# Patient Record
Sex: Female | Born: 1945
Health system: Southern US, Community
[De-identification: ages and names within clinical notes are randomized; demographics above are authoritative.]

## PROBLEM LIST (undated history)

## (undated) DIAGNOSIS — I639 Cerebral infarction, unspecified: Secondary | ICD-10-CM

## (undated) DIAGNOSIS — E785 Hyperlipidemia, unspecified: Secondary | ICD-10-CM

## (undated) DIAGNOSIS — F0390 Unspecified dementia without behavioral disturbance: Secondary | ICD-10-CM

## (undated) DIAGNOSIS — I739 Peripheral vascular disease, unspecified: Secondary | ICD-10-CM

## (undated) DIAGNOSIS — I10 Essential (primary) hypertension: Secondary | ICD-10-CM

## (undated) HISTORY — PX: CHOLECYSTECTOMY: SHX55

## (undated) HISTORY — PX: STENT PLACEMENT ILIAC (ARMC HX): HXRAD1735

## (undated) HISTORY — PX: LEG SURGERY: SHX1003

---

## 1998-12-02 ENCOUNTER — Encounter: Payer: Self-pay | Admitting: Family Medicine

## 1998-12-02 ENCOUNTER — Ambulatory Visit (HOSPITAL_COMMUNITY): Admission: RE | Admit: 1998-12-02 | Discharge: 1998-12-02 | Payer: Self-pay | Admitting: Family Medicine

## 2002-04-27 ENCOUNTER — Ambulatory Visit (HOSPITAL_COMMUNITY): Admission: RE | Admit: 2002-04-27 | Discharge: 2002-04-27 | Payer: Self-pay | Admitting: Internal Medicine

## 2002-04-27 ENCOUNTER — Encounter: Payer: Self-pay | Admitting: Internal Medicine

## 2002-05-09 ENCOUNTER — Encounter: Payer: Self-pay | Admitting: Internal Medicine

## 2002-05-09 ENCOUNTER — Ambulatory Visit (HOSPITAL_COMMUNITY): Admission: RE | Admit: 2002-05-09 | Discharge: 2002-05-09 | Payer: Self-pay | Admitting: Internal Medicine

## 2002-08-30 ENCOUNTER — Encounter: Payer: Self-pay | Admitting: Orthopaedic Surgery

## 2002-08-30 ENCOUNTER — Inpatient Hospital Stay (HOSPITAL_COMMUNITY): Admission: EM | Admit: 2002-08-30 | Discharge: 2002-09-05 | Payer: Self-pay | Admitting: Emergency Medicine

## 2002-08-30 ENCOUNTER — Encounter: Payer: Self-pay | Admitting: Emergency Medicine

## 2005-12-23 ENCOUNTER — Ambulatory Visit: Payer: Self-pay | Admitting: Emergency Medicine

## 2006-01-22 ENCOUNTER — Ambulatory Visit: Admission: RE | Admit: 2006-01-22 | Discharge: 2006-01-22 | Payer: Self-pay | Admitting: Emergency Medicine

## 2006-02-04 ENCOUNTER — Ambulatory Visit: Payer: Self-pay | Admitting: Emergency Medicine

## 2007-08-25 ENCOUNTER — Ambulatory Visit: Payer: Self-pay | Admitting: Internal Medicine

## 2007-09-08 ENCOUNTER — Ambulatory Visit: Payer: Self-pay | Admitting: Internal Medicine

## 2007-09-08 ENCOUNTER — Encounter: Payer: Self-pay | Admitting: Internal Medicine

## 2007-12-12 ENCOUNTER — Ambulatory Visit (HOSPITAL_COMMUNITY): Admission: RE | Admit: 2007-12-12 | Discharge: 2007-12-12 | Payer: Self-pay | Admitting: Cardiovascular Disease

## 2010-07-30 ENCOUNTER — Encounter (INDEPENDENT_AMBULATORY_CARE_PROVIDER_SITE_OTHER): Payer: Self-pay | Admitting: *Deleted

## 2010-12-25 NOTE — Letter (Signed)
Summary: Colonoscopy Letter  [facility]  [address] [place], [place] [address]   Phone: [phone]  Fax: [phone]      July 30, 2010 MRN: [id number]   Morgan Sutton [place], [place]  [address]   Dear [name],   According to your medical record, it is time for you to schedule a Colonoscopy. The American Cancer Society recommends this procedure as a method to detect early colon cancer. Patients with a family history of colon cancer, or a personal history of colon polyps or inflammatory bowel disease are at increased risk.  This letter has beeen generated based on the recommendations made at the time of your procedure. If you feel that in your particular situation this may no longer apply, please contact our office.  Please call our office at [phone] to schedule this appointment or to update your records at your earliest convenience.  Thank you for cooperating with [place] to provide you with the very best care possible.   Sincerely,  Hedwig Morton. Juanda Chance, M.D.  [facility] [phone]

## 2011-04-07 NOTE — Cardiovascular Report (Signed)
NAMESharvi, Morgan Sutton                 ACCOUNT NO.:  [id number]   MEDICAL RECORD NO.:  [id number]          PATIENT TYPE:  AMB   LOCATION:  SDS                          FACILITY:  [facility]   PHYSICIAN:  Nanetta Batty, M.D.   DATE OF BIRTH:  [birth year]   DATE OF PROCEDURE:  DATE OF DISCHARGE:  12/12/2007                            CARDIAC CATHETERIZATION   PERIPHERAL ANGIOGRAM REPORT:  Morgan Sutton is a 65 year old mildly overweight Caucasian female patient  of Dr. Collins Scotland referred for claudication.  She also has been complaining  of tightening around her chest.   PAST HISTORY:  1. A remote TIA.  2. Hypertension.  3. Hyperlipidemia.  4. She does have a history of tobacco abuse.   She had a negative Myoview and normal 2-D echo.  Dopplers suggested a  moderately tight lesion in her left iliac.  She presents now for  abdominal aortography with bifemoral runoff.   PROCEDURE DESCRIPTION:  The patient was brought to the second floor  [facility] angiographic suite in the postabsorptive state.  She was  premedicated with p.o. Valium.  Her left groin was prepped and shaved in  the usual sterile fashion.  One percent Xylocaine was used for local  anesthesia.  A 5-French sheath was inserted into the left femoral artery  using the standard Seldinger technique.  A 5-French pigtail catheter and  4-French end-hole catheters were used for midstream and distal abdominal  aortography with bifemoral runoff.  A pullback gradient was performed  using a 4-French end-hole catheter across the left common iliac artery  after administration of 200 mcg of intra-arterial nitroglycerin to the  side-arm sheath.  Visipaque dye was used throughout the entirety of the  case.  Retrograde aortic pressure was monitored during the case.   ANGIOGRAPHIC RESULTS:  1. Abdominal aorta.      a.     Renal arteries - normal.      b.     Infrarenal abdominal aorta - normal.  2. Left lower extremity.      a.     Forty to fifty  percent diffuse left common iliac artery       stenosis with a 20-mm pullback gradient throughout its entirety       after administration of intra-arterial nitroglycerin distally.      b.     Three-vessel runoff.  3. Right lower extremity; normal.   IMPRESSION:  Morgan Sutton has an incidentally noted mild to moderate left  common iliac artery stenosis.  I do not think this is hemodynamically  significant enough to be causing her symptoms which are foot pain.  She  really denies hip and buttock or thigh claudication.   Sheath was removed and pressure applied to achieve hemostasis.  The  patient left the lab in stable condition.  She will remain recumbent for  5 hours and will be discharged home later today.  As an outpatient, she  will see me back in the office in 1-2 weeks for follow up.  She left the  lab in stable condition.      Christiane Ha  Allyson Sabal, M.D.  Electronically Signed     JB/MEDQ  D:  12/12/2007  T:  12/12/2007  Job:  [id number]   cc:   [facility]  [facility]  Tammy R. Collins Scotland, M.D.

## 2011-04-10 NOTE — Op Note (Signed)
NAMEFRANK, Morgan Sutton                           ACCOUNT NO.:  [id number]   MEDICAL RECORD NO.:  [id number]                   PATIENT TYPE:  INP   LOCATION:  0454                                 FACILITY:  [facility]   PHYSICIAN:  Claude Manges. Cleophas Dunker, M.D.            DATE OF BIRTH:  [birth year]   DATE OF PROCEDURE:  08/30/2002  DATE OF DISCHARGE:                                 OPERATIVE REPORT   PREOPERATIVE DIAGNOSIS:  Grade 1 open right tibiofibular fracture.   POSTOPERATIVE DIAGNOSIS:  Grade 1 open right tibiofibular fracture.   PROCEDURE:  1. I&D of open fractures.  2. Nonreamed intermedullary nailing of right tibia fracture.   SURGEON:  Claude Manges. Cleophas Dunker, M.D.   ASSISTANT:  Legrand Pitts. Duffy, P.A.-C.   ANESTHESIA:  General orotracheal.   COMPLICATIONS:  None.   DESCRIPTION OF PROCEDURE:  The patient comfortable on the operating table  and under general orotracheal anesthesia, the right lower extremity was  placed in a thigh tourniquet.  The leg was then prepped from the tourniquet  to the tips of the toes with Betadine scrub and then DuraPrep.  Sterile  draping was performed.  Then with the extremity still elevated, it was  Esmarch exsanguinated with a proximal tourniquet at 325 mmHg.   The fracture involved the junction of the middle and distal thirds of the  tibia.  There was comminution, and there were segmental fractures.  The open  component involved two punctate lesions at the level of the fracture  anteriorly.  There was very little bleeding.   The punctate areas were just opened up minimally with a 15 blade knife and  then irrigated with antibiotic solution.   About an inch and a half to two inch incision was made over the proximal  tibia, just medial to the midline of our sharp dissection and carried down  to subcutaneous tissue through fascia.  The patella ligament was identified  and retracted laterally.  Some periosteal dissection was performed.  At a  point  midway between the tibial tubercle and the joint line, a starter hole  was made with the awl.  The ball-tip guidepin was then placed through the  proximal and the tibial metaphysis and carefully directed across the  fracture site under direct visualization with the image intensifier.  Maintaining traction and reduction of the fracture, we had excellent  position on the first attempt.   We then inserted the plastic exchange tube followed by the smaller rigid  nonball-tipped guidepin.  We checked the fracture in AP and lateral  projection and felt we were in excellent position.   I elected to use a nonreamed nail.  We had templated either an 8 or a 9 and  felt the 9 would be very tight, and we did not want to further comminute the  fracture, so the ACE titanium tibial nail was utilized.  It was cannulated  and placed over the guidepin.  I then carefully directed with the proximal  guide attached to it across the fracture site, again maintaining traction  and alignment of the leg.  We checked with image intensification,  and we  carefully advanced the nail to the distal tibial metaphysis.  We checked an  AP and lateral projection and felt we had excellent position and excellent  stability with the varus valgus position.  It was still unstable with  rotation.   The proximal guide was used to insert one oblique nail in the proximal  tibia, and this was checked after appropriate reaming and measuring and  inserting the appropriate 4.5 screw.  It was in excellent position.   I elected to fix the IM nail distally with two screws.  One was placed  medial to lateral, the other anterior to posterior.  Using image  intensification, we free-handed the drill through the hole, appropriately  measured the depth of this, and inserted the self-tapping fixation screws.  We checked position and felt we were perfectly stable clinically with  rotation and in rotation without any enlargement in the fracture  site, and  the distal screws were within the bone and the IM nail.   The small screw insertion incisions were closed with skin clips.  I closed  one of the two irrigation incisions.  The proximal incision was closed with  a running 2-0 Vicryl and then skin clips.  Tourniquet was deflated at about  an hour.  There was immediate capillary refill to the toes, and we could  palpate the pulse.   Sterile bulky dressing was applied, followed by knee immobilizer.   The patient tolerated the procedure without complications.                                               Claude Manges. Cleophas Dunker, M.D.    PWW/MEDQ  D:  08/30/2002  T:  08/31/2002  Job:  [id number]

## 2011-04-10 NOTE — Discharge Summary (Signed)
Morgan Sutton, Morgan Sutton                           ACCOUNT NO.:  [id number]   MEDICAL RECORD NO.:  [id number]                   PATIENT TYPE:  INP   LOCATION:  0454                                 FACILITY:  [facility]   PHYSICIAN:  Claude Manges. Cleophas Dunker, M.D.            DATE OF BIRTH:  December 20, 1945   DATE OF ADMISSION:  08/30/2002  DATE OF DISCHARGE:  09/05/2002                                 DISCHARGE SUMMARY   ADMISSION DIAGNOSES:  1. Right tibia-fibula fracture.  2. Hypertension.  3. Anxiety disorder.  4. Depression.  5. History of peptic ulcer disease.  6. History of cerebrovascular accident.   DISCHARGE DIAGNOSES:  1. Comminuted displaced right tibia-fibula fracture status post     intramedullary nail of said fracture.  2. Mild cellulitis.  3. Urinary tract infection.  4. Constipation.  5. Hypertension.  6. Anxiety disorder.  7. Depression.  8. History of peptic ulcer disease.  9. History of cerebrovascular accident.   SURGICAL PROCEDURE:  On August 30, 2002 the patient underwent an I&D of her  right open tib-fib fracture with a intramedullary nail placement by Claude Manges.  Cleophas Dunker, M.D. assisted by Legrand Pitts. Duffy, P.A.-C.   COMPLICATIONS:  None.   CONSULTATIONS:  1. Pharmacy consult for gentamycin therapy August 31, 2002 in addition to a     case management consult, physical therapy, and occupational therapy     consult.  2. Rehab medicine consult August 31, 2002.   HISTORY OF PRESENT ILLNESS:  This 65 year old white female patient went to  jump off of her three-foot porch and she slipped and landed directly on that  right leg.  She complained of immediate pain and deformity of the leg and  was unable to stand.  She was brought to the emergency department where she  was found to have a comminuted displaced right tib-fib fracture and this was  open.  She was admitted for surgical fixation of the fracture.   HOSPITAL COURSE:  The patient tolerated her surgical procedure  well without  immediate postoperative complications.  She was subsequently transferred to  [facility] [place].  On postop day #1, she was afebrile with a T-max of 99.6.  Right leg  dressing was intact without drainage and leg was neurovascularly intact.  She did have some mild calf pain and this was monitored.   On postop day #2, dressing was changed and she was noted to have some  redness streaking from her ankle down into her foot.  It was felt the  staples were tight and that was taken out and she was placed in a posterior  splint.  She was switched to p.o. pain medicine and continued on therapy.  She was also noted on her admission urinalysis to have a urinary tract  infection and she was continued on IV antibiotic.  She continued to make  slow progress with therapy over the next several days.  She was switched to  a Verdene Rio on October 12th.  Her pain continued to decrease.  We felt she  would be a good candidate for rehab but she started to progress a little  faster and then was ready to go home on October 14th.  At that time, she was  having some problems with constipation and that was treated effectively with  a laxative and she was able to be discharged home later that day.   DISCHARGE INSTRUCTIONS:   DIET:  She can resume her regular prehospitalization diet.   MEDICATIONS:  She can continue her current prehospitalization medications  which included:  1. Maxzide 70/50 mg p.o. q.d.  2. Verapamil SR 240 mg p.o. q.d.  3. Effexor 37.5 mg p.o. b.i.d.  4. Trazodone 50 mg p.o. q.h.s.   Additional medications include:  1. Aspirin 325 mg, enteric-coated, one tablet p.o. q.d. for one month.  2. Senokot-S two tablets p.o. q.h.s.  May repeat in the a.m. if no BM.  3. Robaxin 500 mg 1-2 p.o. q.6 h. p.r.n. for spasm, dispense #40 with no     refill.  4. OxyContin 20 mg p.o. q.12 h., dispense #20 with no refill.  5. Percocet 5/325 mg 1-2 p.o. q.4 h. p.r.n. for pain, dispense #50 with no      refill.   ACTIVITY:  She is to be out of bed, partial weightbearing, 50% or less on  the right leg with the use of the walker.  She can keep the CAM Walker boot  in place and elevate the right foot whenever she is in bed.  She has  arranged for home health physical therapy per [facility].   WOUND CARE:  She needs to keep her right leg incisions clean and dry and  notify Dr. Cleophas Dunker of temp greater than 101.5, chills, pain unrelieved by  pain meds, or foul smelling drainage from the wound.   FOLLOW UP:  She needs a followup with Dr. Cleophas Dunker in our office the week  after discharge and she needs to call [phone] for that appointment.   LABORATORY AND ACCESSORY DATA:  On October 8th, white count 11.8, hemoglobin  15.2, hematocrit 44.1.  On October 9th, white count 11.9, hemoglobin 12.2,  hematocrit 35.9.  On October 11th, white count 7.9, hemoglobin 11.6,  hematocrit 33.6 and platelets 156.   On October 8th, potassium 3, glucose 116.  On October 9th, potassium 3.1,  glucose 124.  Urinalysis on October 8th showed cloudy yellow urine with  positive nitrite, small leukocyte esterase, rare epithelials, 3-6 white  cells, and few bacteria.   Portable x-ray used intraoperatively on October 8th showed severely  comminuted fracture of the distal tibia and fibula with possible air within  the ankle joint.  Chest x-ray taken on October 8th showed no active disease.  A C-arm was used intraoperatively and no films were kept for interpretation.     Legrand Pitts Duffy, P.A.                      Claude Manges. Cleophas Dunker, M.D.    KED/MEDQ  D:  09/23/2002  T:  09/24/2002  Job:  [id number]   cc:   Willis Modena, N.P.  [facility]

## 2011-08-13 LAB — URINALYSIS, ROUTINE W REFLEX MICROSCOPIC
Hgb urine dipstick: NEGATIVE
Nitrite: NEGATIVE
Protein, ur: NEGATIVE
Urobilinogen, UA: 0.2

## 2011-08-13 LAB — URINE MICROSCOPIC-ADD ON

## 2012-07-27 ENCOUNTER — Encounter: Payer: Self-pay | Admitting: Internal Medicine

## 2014-12-03 ENCOUNTER — Encounter: Payer: Self-pay | Admitting: Internal Medicine

## 2014-12-27 DIAGNOSIS — Z1239 Encounter for other screening for malignant neoplasm of breast: Secondary | ICD-10-CM | POA: Diagnosis not present

## 2014-12-27 DIAGNOSIS — Z79899 Other long term (current) drug therapy: Secondary | ICD-10-CM | POA: Diagnosis not present

## 2014-12-27 DIAGNOSIS — I2510 Atherosclerotic heart disease of native coronary artery without angina pectoris: Secondary | ICD-10-CM | POA: Diagnosis not present

## 2014-12-27 DIAGNOSIS — I10 Essential (primary) hypertension: Secondary | ICD-10-CM | POA: Diagnosis not present

## 2014-12-27 DIAGNOSIS — E780 Pure hypercholesterolemia: Secondary | ICD-10-CM | POA: Diagnosis not present

## 2014-12-27 DIAGNOSIS — J209 Acute bronchitis, unspecified: Secondary | ICD-10-CM | POA: Diagnosis not present

## 2015-01-10 DIAGNOSIS — I10 Essential (primary) hypertension: Secondary | ICD-10-CM | POA: Diagnosis not present

## 2015-01-10 DIAGNOSIS — R7309 Other abnormal glucose: Secondary | ICD-10-CM | POA: Diagnosis not present

## 2015-01-10 DIAGNOSIS — E780 Pure hypercholesterolemia: Secondary | ICD-10-CM | POA: Diagnosis not present

## 2016-01-23 DIAGNOSIS — G510 Bell's palsy: Secondary | ICD-10-CM | POA: Diagnosis not present

## 2016-01-23 DIAGNOSIS — R079 Chest pain, unspecified: Secondary | ICD-10-CM | POA: Diagnosis not present

## 2016-01-23 DIAGNOSIS — R29810 Facial weakness: Secondary | ICD-10-CM | POA: Diagnosis not present

## 2016-03-18 DIAGNOSIS — M25512 Pain in left shoulder: Secondary | ICD-10-CM | POA: Diagnosis not present

## 2016-03-18 DIAGNOSIS — S46912A Strain of unspecified muscle, fascia and tendon at shoulder and upper arm level, left arm, initial encounter: Secondary | ICD-10-CM | POA: Diagnosis not present

## 2016-05-05 DIAGNOSIS — I10 Essential (primary) hypertension: Secondary | ICD-10-CM | POA: Diagnosis not present

## 2016-05-05 DIAGNOSIS — F99 Mental disorder, not otherwise specified: Secondary | ICD-10-CM | POA: Diagnosis not present

## 2016-05-05 DIAGNOSIS — Z6829 Body mass index (BMI) 29.0-29.9, adult: Secondary | ICD-10-CM | POA: Diagnosis not present

## 2016-05-05 DIAGNOSIS — R29898 Other symptoms and signs involving the musculoskeletal system: Secondary | ICD-10-CM | POA: Diagnosis not present

## 2016-05-05 DIAGNOSIS — N3281 Overactive bladder: Secondary | ICD-10-CM | POA: Diagnosis not present

## 2016-05-05 DIAGNOSIS — I639 Cerebral infarction, unspecified: Secondary | ICD-10-CM | POA: Diagnosis not present

## 2016-05-15 DIAGNOSIS — R440 Auditory hallucinations: Secondary | ICD-10-CM | POA: Diagnosis not present

## 2016-05-15 DIAGNOSIS — Z8673 Personal history of transient ischemic attack (TIA), and cerebral infarction without residual deficits: Secondary | ICD-10-CM | POA: Diagnosis not present

## 2016-05-15 DIAGNOSIS — I119 Hypertensive heart disease without heart failure: Secondary | ICD-10-CM | POA: Diagnosis not present

## 2016-05-15 DIAGNOSIS — R441 Visual hallucinations: Secondary | ICD-10-CM | POA: Diagnosis not present

## 2016-05-15 DIAGNOSIS — M1991 Primary osteoarthritis, unspecified site: Secondary | ICD-10-CM | POA: Diagnosis not present

## 2016-05-15 DIAGNOSIS — R296 Repeated falls: Secondary | ICD-10-CM | POA: Diagnosis not present

## 2016-05-15 DIAGNOSIS — R29898 Other symptoms and signs involving the musculoskeletal system: Secondary | ICD-10-CM | POA: Diagnosis not present

## 2016-05-15 DIAGNOSIS — F0390 Unspecified dementia without behavioral disturbance: Secondary | ICD-10-CM | POA: Diagnosis not present

## 2016-05-15 DIAGNOSIS — Z6829 Body mass index (BMI) 29.0-29.9, adult: Secondary | ICD-10-CM | POA: Diagnosis not present

## 2016-05-15 DIAGNOSIS — N3281 Overactive bladder: Secondary | ICD-10-CM | POA: Diagnosis not present

## 2016-05-15 DIAGNOSIS — Z7982 Long term (current) use of aspirin: Secondary | ICD-10-CM | POA: Diagnosis not present

## 2016-05-15 DIAGNOSIS — Z9181 History of falling: Secondary | ICD-10-CM | POA: Diagnosis not present

## 2016-05-19 DIAGNOSIS — F0390 Unspecified dementia without behavioral disturbance: Secondary | ICD-10-CM | POA: Diagnosis not present

## 2016-05-19 DIAGNOSIS — M1991 Primary osteoarthritis, unspecified site: Secondary | ICD-10-CM | POA: Diagnosis not present

## 2016-05-19 DIAGNOSIS — R441 Visual hallucinations: Secondary | ICD-10-CM | POA: Diagnosis not present

## 2016-05-19 DIAGNOSIS — N3281 Overactive bladder: Secondary | ICD-10-CM | POA: Diagnosis not present

## 2016-05-19 DIAGNOSIS — R440 Auditory hallucinations: Secondary | ICD-10-CM | POA: Diagnosis not present

## 2016-05-19 DIAGNOSIS — I119 Hypertensive heart disease without heart failure: Secondary | ICD-10-CM | POA: Diagnosis not present

## 2016-05-20 DIAGNOSIS — I119 Hypertensive heart disease without heart failure: Secondary | ICD-10-CM | POA: Diagnosis not present

## 2016-05-20 DIAGNOSIS — R440 Auditory hallucinations: Secondary | ICD-10-CM | POA: Diagnosis not present

## 2016-05-20 DIAGNOSIS — M1991 Primary osteoarthritis, unspecified site: Secondary | ICD-10-CM | POA: Diagnosis not present

## 2016-05-20 DIAGNOSIS — N3281 Overactive bladder: Secondary | ICD-10-CM | POA: Diagnosis not present

## 2016-05-20 DIAGNOSIS — F0390 Unspecified dementia without behavioral disturbance: Secondary | ICD-10-CM | POA: Diagnosis not present

## 2016-05-20 DIAGNOSIS — R441 Visual hallucinations: Secondary | ICD-10-CM | POA: Diagnosis not present

## 2016-05-22 DIAGNOSIS — R441 Visual hallucinations: Secondary | ICD-10-CM | POA: Diagnosis not present

## 2016-05-22 DIAGNOSIS — I119 Hypertensive heart disease without heart failure: Secondary | ICD-10-CM | POA: Diagnosis not present

## 2016-05-22 DIAGNOSIS — N3281 Overactive bladder: Secondary | ICD-10-CM | POA: Diagnosis not present

## 2016-05-22 DIAGNOSIS — M1991 Primary osteoarthritis, unspecified site: Secondary | ICD-10-CM | POA: Diagnosis not present

## 2016-05-22 DIAGNOSIS — R440 Auditory hallucinations: Secondary | ICD-10-CM | POA: Diagnosis not present

## 2016-05-22 DIAGNOSIS — F0390 Unspecified dementia without behavioral disturbance: Secondary | ICD-10-CM | POA: Diagnosis not present

## 2016-05-25 DIAGNOSIS — M1991 Primary osteoarthritis, unspecified site: Secondary | ICD-10-CM | POA: Diagnosis not present

## 2016-05-25 DIAGNOSIS — F0390 Unspecified dementia without behavioral disturbance: Secondary | ICD-10-CM | POA: Diagnosis not present

## 2016-05-25 DIAGNOSIS — R0602 Shortness of breath: Secondary | ICD-10-CM | POA: Diagnosis not present

## 2016-05-25 DIAGNOSIS — N3281 Overactive bladder: Secondary | ICD-10-CM | POA: Diagnosis not present

## 2016-05-25 DIAGNOSIS — R440 Auditory hallucinations: Secondary | ICD-10-CM | POA: Diagnosis not present

## 2016-05-25 DIAGNOSIS — R441 Visual hallucinations: Secondary | ICD-10-CM | POA: Diagnosis not present

## 2016-05-25 DIAGNOSIS — I10 Essential (primary) hypertension: Secondary | ICD-10-CM | POA: Diagnosis not present

## 2016-05-25 DIAGNOSIS — Z79899 Other long term (current) drug therapy: Secondary | ICD-10-CM | POA: Diagnosis not present

## 2016-05-25 DIAGNOSIS — I119 Hypertensive heart disease without heart failure: Secondary | ICD-10-CM | POA: Diagnosis not present

## 2016-05-25 DIAGNOSIS — R42 Dizziness and giddiness: Secondary | ICD-10-CM | POA: Diagnosis not present

## 2016-05-26 DIAGNOSIS — R441 Visual hallucinations: Secondary | ICD-10-CM | POA: Diagnosis not present

## 2016-05-26 DIAGNOSIS — N3281 Overactive bladder: Secondary | ICD-10-CM | POA: Diagnosis not present

## 2016-05-26 DIAGNOSIS — I119 Hypertensive heart disease without heart failure: Secondary | ICD-10-CM | POA: Diagnosis not present

## 2016-05-26 DIAGNOSIS — M1991 Primary osteoarthritis, unspecified site: Secondary | ICD-10-CM | POA: Diagnosis not present

## 2016-05-26 DIAGNOSIS — R440 Auditory hallucinations: Secondary | ICD-10-CM | POA: Diagnosis not present

## 2016-05-26 DIAGNOSIS — F0390 Unspecified dementia without behavioral disturbance: Secondary | ICD-10-CM | POA: Diagnosis not present

## 2016-05-28 DIAGNOSIS — M1991 Primary osteoarthritis, unspecified site: Secondary | ICD-10-CM | POA: Diagnosis not present

## 2016-05-28 DIAGNOSIS — F0390 Unspecified dementia without behavioral disturbance: Secondary | ICD-10-CM | POA: Diagnosis not present

## 2016-05-28 DIAGNOSIS — N3281 Overactive bladder: Secondary | ICD-10-CM | POA: Diagnosis not present

## 2016-05-28 DIAGNOSIS — R441 Visual hallucinations: Secondary | ICD-10-CM | POA: Diagnosis not present

## 2016-05-28 DIAGNOSIS — I119 Hypertensive heart disease without heart failure: Secondary | ICD-10-CM | POA: Diagnosis not present

## 2016-05-28 DIAGNOSIS — R440 Auditory hallucinations: Secondary | ICD-10-CM | POA: Diagnosis not present

## 2016-05-29 DIAGNOSIS — R440 Auditory hallucinations: Secondary | ICD-10-CM | POA: Diagnosis not present

## 2016-05-29 DIAGNOSIS — F0390 Unspecified dementia without behavioral disturbance: Secondary | ICD-10-CM | POA: Diagnosis not present

## 2016-05-29 DIAGNOSIS — M1991 Primary osteoarthritis, unspecified site: Secondary | ICD-10-CM | POA: Diagnosis not present

## 2016-05-29 DIAGNOSIS — N3281 Overactive bladder: Secondary | ICD-10-CM | POA: Diagnosis not present

## 2016-05-29 DIAGNOSIS — I119 Hypertensive heart disease without heart failure: Secondary | ICD-10-CM | POA: Diagnosis not present

## 2016-05-29 DIAGNOSIS — R441 Visual hallucinations: Secondary | ICD-10-CM | POA: Diagnosis not present

## 2016-06-01 DIAGNOSIS — F0390 Unspecified dementia without behavioral disturbance: Secondary | ICD-10-CM | POA: Diagnosis not present

## 2016-06-01 DIAGNOSIS — I119 Hypertensive heart disease without heart failure: Secondary | ICD-10-CM | POA: Diagnosis not present

## 2016-06-01 DIAGNOSIS — N3281 Overactive bladder: Secondary | ICD-10-CM | POA: Diagnosis not present

## 2016-06-01 DIAGNOSIS — R440 Auditory hallucinations: Secondary | ICD-10-CM | POA: Diagnosis not present

## 2016-06-01 DIAGNOSIS — R441 Visual hallucinations: Secondary | ICD-10-CM | POA: Diagnosis not present

## 2016-06-01 DIAGNOSIS — M1991 Primary osteoarthritis, unspecified site: Secondary | ICD-10-CM | POA: Diagnosis not present

## 2016-06-02 DIAGNOSIS — I119 Hypertensive heart disease without heart failure: Secondary | ICD-10-CM | POA: Diagnosis not present

## 2016-06-02 DIAGNOSIS — N3281 Overactive bladder: Secondary | ICD-10-CM | POA: Diagnosis not present

## 2016-06-02 DIAGNOSIS — M1991 Primary osteoarthritis, unspecified site: Secondary | ICD-10-CM | POA: Diagnosis not present

## 2016-06-02 DIAGNOSIS — R441 Visual hallucinations: Secondary | ICD-10-CM | POA: Diagnosis not present

## 2016-06-02 DIAGNOSIS — R440 Auditory hallucinations: Secondary | ICD-10-CM | POA: Diagnosis not present

## 2016-06-02 DIAGNOSIS — F0390 Unspecified dementia without behavioral disturbance: Secondary | ICD-10-CM | POA: Diagnosis not present

## 2016-06-03 DIAGNOSIS — R441 Visual hallucinations: Secondary | ICD-10-CM | POA: Diagnosis not present

## 2016-06-03 DIAGNOSIS — I119 Hypertensive heart disease without heart failure: Secondary | ICD-10-CM | POA: Diagnosis not present

## 2016-06-03 DIAGNOSIS — F0390 Unspecified dementia without behavioral disturbance: Secondary | ICD-10-CM | POA: Diagnosis not present

## 2016-06-03 DIAGNOSIS — M1991 Primary osteoarthritis, unspecified site: Secondary | ICD-10-CM | POA: Diagnosis not present

## 2016-06-03 DIAGNOSIS — R440 Auditory hallucinations: Secondary | ICD-10-CM | POA: Diagnosis not present

## 2016-06-03 DIAGNOSIS — N3281 Overactive bladder: Secondary | ICD-10-CM | POA: Diagnosis not present

## 2016-06-04 DIAGNOSIS — Z6829 Body mass index (BMI) 29.0-29.9, adult: Secondary | ICD-10-CM | POA: Diagnosis not present

## 2016-06-04 DIAGNOSIS — I10 Essential (primary) hypertension: Secondary | ICD-10-CM | POA: Diagnosis not present

## 2016-06-04 DIAGNOSIS — Z1389 Encounter for screening for other disorder: Secondary | ICD-10-CM | POA: Diagnosis not present

## 2016-06-04 DIAGNOSIS — E041 Nontoxic single thyroid nodule: Secondary | ICD-10-CM | POA: Diagnosis not present

## 2016-06-04 DIAGNOSIS — R739 Hyperglycemia, unspecified: Secondary | ICD-10-CM | POA: Diagnosis not present

## 2016-06-04 DIAGNOSIS — Z79899 Other long term (current) drug therapy: Secondary | ICD-10-CM | POA: Diagnosis not present

## 2016-06-04 DIAGNOSIS — Z9181 History of falling: Secondary | ICD-10-CM | POA: Diagnosis not present

## 2016-06-04 DIAGNOSIS — F99 Mental disorder, not otherwise specified: Secondary | ICD-10-CM | POA: Diagnosis not present

## 2016-06-05 DIAGNOSIS — R441 Visual hallucinations: Secondary | ICD-10-CM | POA: Diagnosis not present

## 2016-06-05 DIAGNOSIS — F0390 Unspecified dementia without behavioral disturbance: Secondary | ICD-10-CM | POA: Diagnosis not present

## 2016-06-05 DIAGNOSIS — R440 Auditory hallucinations: Secondary | ICD-10-CM | POA: Diagnosis not present

## 2016-06-05 DIAGNOSIS — I119 Hypertensive heart disease without heart failure: Secondary | ICD-10-CM | POA: Diagnosis not present

## 2016-06-05 DIAGNOSIS — M1991 Primary osteoarthritis, unspecified site: Secondary | ICD-10-CM | POA: Diagnosis not present

## 2016-06-05 DIAGNOSIS — N3281 Overactive bladder: Secondary | ICD-10-CM | POA: Diagnosis not present

## 2016-06-09 DIAGNOSIS — R441 Visual hallucinations: Secondary | ICD-10-CM | POA: Diagnosis not present

## 2016-06-09 DIAGNOSIS — M1991 Primary osteoarthritis, unspecified site: Secondary | ICD-10-CM | POA: Diagnosis not present

## 2016-06-09 DIAGNOSIS — N3281 Overactive bladder: Secondary | ICD-10-CM | POA: Diagnosis not present

## 2016-06-09 DIAGNOSIS — I119 Hypertensive heart disease without heart failure: Secondary | ICD-10-CM | POA: Diagnosis not present

## 2016-06-09 DIAGNOSIS — F0390 Unspecified dementia without behavioral disturbance: Secondary | ICD-10-CM | POA: Diagnosis not present

## 2016-06-09 DIAGNOSIS — R440 Auditory hallucinations: Secondary | ICD-10-CM | POA: Diagnosis not present

## 2016-06-10 DIAGNOSIS — F0390 Unspecified dementia without behavioral disturbance: Secondary | ICD-10-CM | POA: Diagnosis not present

## 2016-06-10 DIAGNOSIS — R443 Hallucinations, unspecified: Secondary | ICD-10-CM | POA: Diagnosis not present

## 2016-06-10 DIAGNOSIS — I119 Hypertensive heart disease without heart failure: Secondary | ICD-10-CM | POA: Diagnosis not present

## 2016-06-10 DIAGNOSIS — R413 Other amnesia: Secondary | ICD-10-CM | POA: Diagnosis not present

## 2016-06-10 DIAGNOSIS — M1991 Primary osteoarthritis, unspecified site: Secondary | ICD-10-CM | POA: Diagnosis not present

## 2016-06-10 DIAGNOSIS — R441 Visual hallucinations: Secondary | ICD-10-CM | POA: Diagnosis not present

## 2016-06-10 DIAGNOSIS — R440 Auditory hallucinations: Secondary | ICD-10-CM | POA: Diagnosis not present

## 2016-06-10 DIAGNOSIS — N3281 Overactive bladder: Secondary | ICD-10-CM | POA: Diagnosis not present

## 2016-06-12 DIAGNOSIS — I119 Hypertensive heart disease without heart failure: Secondary | ICD-10-CM | POA: Diagnosis not present

## 2016-06-12 DIAGNOSIS — R441 Visual hallucinations: Secondary | ICD-10-CM | POA: Diagnosis not present

## 2016-06-12 DIAGNOSIS — M1991 Primary osteoarthritis, unspecified site: Secondary | ICD-10-CM | POA: Diagnosis not present

## 2016-06-12 DIAGNOSIS — F0390 Unspecified dementia without behavioral disturbance: Secondary | ICD-10-CM | POA: Diagnosis not present

## 2016-06-12 DIAGNOSIS — R440 Auditory hallucinations: Secondary | ICD-10-CM | POA: Diagnosis not present

## 2016-06-12 DIAGNOSIS — N3281 Overactive bladder: Secondary | ICD-10-CM | POA: Diagnosis not present

## 2016-06-16 DIAGNOSIS — R441 Visual hallucinations: Secondary | ICD-10-CM | POA: Diagnosis not present

## 2016-06-16 DIAGNOSIS — N3281 Overactive bladder: Secondary | ICD-10-CM | POA: Diagnosis not present

## 2016-06-16 DIAGNOSIS — I119 Hypertensive heart disease without heart failure: Secondary | ICD-10-CM | POA: Diagnosis not present

## 2016-06-16 DIAGNOSIS — M1991 Primary osteoarthritis, unspecified site: Secondary | ICD-10-CM | POA: Diagnosis not present

## 2016-06-16 DIAGNOSIS — F0390 Unspecified dementia without behavioral disturbance: Secondary | ICD-10-CM | POA: Diagnosis not present

## 2016-06-16 DIAGNOSIS — R440 Auditory hallucinations: Secondary | ICD-10-CM | POA: Diagnosis not present

## 2016-06-19 DIAGNOSIS — M1991 Primary osteoarthritis, unspecified site: Secondary | ICD-10-CM | POA: Diagnosis not present

## 2016-06-19 DIAGNOSIS — R440 Auditory hallucinations: Secondary | ICD-10-CM | POA: Diagnosis not present

## 2016-06-19 DIAGNOSIS — N3281 Overactive bladder: Secondary | ICD-10-CM | POA: Diagnosis not present

## 2016-06-19 DIAGNOSIS — R441 Visual hallucinations: Secondary | ICD-10-CM | POA: Diagnosis not present

## 2016-06-19 DIAGNOSIS — F0390 Unspecified dementia without behavioral disturbance: Secondary | ICD-10-CM | POA: Diagnosis not present

## 2016-06-19 DIAGNOSIS — I119 Hypertensive heart disease without heart failure: Secondary | ICD-10-CM | POA: Diagnosis not present

## 2016-06-22 DIAGNOSIS — I119 Hypertensive heart disease without heart failure: Secondary | ICD-10-CM | POA: Diagnosis not present

## 2016-06-22 DIAGNOSIS — M1991 Primary osteoarthritis, unspecified site: Secondary | ICD-10-CM | POA: Diagnosis not present

## 2016-06-22 DIAGNOSIS — R441 Visual hallucinations: Secondary | ICD-10-CM | POA: Diagnosis not present

## 2016-06-22 DIAGNOSIS — R440 Auditory hallucinations: Secondary | ICD-10-CM | POA: Diagnosis not present

## 2016-06-22 DIAGNOSIS — N3281 Overactive bladder: Secondary | ICD-10-CM | POA: Diagnosis not present

## 2016-06-22 DIAGNOSIS — F0390 Unspecified dementia without behavioral disturbance: Secondary | ICD-10-CM | POA: Diagnosis not present

## 2016-10-21 DIAGNOSIS — R6889 Other general symptoms and signs: Secondary | ICD-10-CM | POA: Diagnosis not present

## 2016-10-21 DIAGNOSIS — R001 Bradycardia, unspecified: Secondary | ICD-10-CM | POA: Diagnosis not present

## 2017-01-13 DIAGNOSIS — F0390 Unspecified dementia without behavioral disturbance: Secondary | ICD-10-CM | POA: Diagnosis not present

## 2017-01-13 DIAGNOSIS — I739 Peripheral vascular disease, unspecified: Secondary | ICD-10-CM | POA: Diagnosis not present

## 2017-01-13 DIAGNOSIS — R739 Hyperglycemia, unspecified: Secondary | ICD-10-CM | POA: Diagnosis not present

## 2017-01-13 DIAGNOSIS — I10 Essential (primary) hypertension: Secondary | ICD-10-CM | POA: Diagnosis not present

## 2017-01-13 DIAGNOSIS — R32 Unspecified urinary incontinence: Secondary | ICD-10-CM | POA: Diagnosis not present

## 2017-01-13 DIAGNOSIS — Z79899 Other long term (current) drug therapy: Secondary | ICD-10-CM | POA: Diagnosis not present

## 2017-01-13 DIAGNOSIS — E785 Hyperlipidemia, unspecified: Secondary | ICD-10-CM | POA: Diagnosis not present

## 2017-01-13 DIAGNOSIS — Z6830 Body mass index (BMI) 30.0-30.9, adult: Secondary | ICD-10-CM | POA: Diagnosis not present

## 2017-07-19 DIAGNOSIS — Z6828 Body mass index (BMI) 28.0-28.9, adult: Secondary | ICD-10-CM | POA: Diagnosis not present

## 2017-07-19 DIAGNOSIS — R739 Hyperglycemia, unspecified: Secondary | ICD-10-CM | POA: Diagnosis not present

## 2017-07-19 DIAGNOSIS — I739 Peripheral vascular disease, unspecified: Secondary | ICD-10-CM | POA: Diagnosis not present

## 2017-07-19 DIAGNOSIS — E785 Hyperlipidemia, unspecified: Secondary | ICD-10-CM | POA: Diagnosis not present

## 2017-07-19 DIAGNOSIS — R32 Unspecified urinary incontinence: Secondary | ICD-10-CM | POA: Diagnosis not present

## 2017-07-19 DIAGNOSIS — R29898 Other symptoms and signs involving the musculoskeletal system: Secondary | ICD-10-CM | POA: Diagnosis not present

## 2017-07-19 DIAGNOSIS — Z79899 Other long term (current) drug therapy: Secondary | ICD-10-CM | POA: Diagnosis not present

## 2017-07-19 DIAGNOSIS — E663 Overweight: Secondary | ICD-10-CM | POA: Diagnosis not present

## 2017-07-19 DIAGNOSIS — F0390 Unspecified dementia without behavioral disturbance: Secondary | ICD-10-CM | POA: Diagnosis not present

## 2017-07-19 DIAGNOSIS — I10 Essential (primary) hypertension: Secondary | ICD-10-CM | POA: Diagnosis not present

## 2017-12-29 DIAGNOSIS — R531 Weakness: Secondary | ICD-10-CM | POA: Diagnosis not present

## 2017-12-29 DIAGNOSIS — G459 Transient cerebral ischemic attack, unspecified: Secondary | ICD-10-CM | POA: Diagnosis not present

## 2017-12-29 DIAGNOSIS — R404 Transient alteration of awareness: Secondary | ICD-10-CM | POA: Diagnosis not present

## 2017-12-30 DIAGNOSIS — R29700 NIHSS score 0: Secondary | ICD-10-CM | POA: Diagnosis present

## 2017-12-30 DIAGNOSIS — R531 Weakness: Secondary | ICD-10-CM | POA: Diagnosis not present

## 2017-12-30 DIAGNOSIS — I639 Cerebral infarction, unspecified: Secondary | ICD-10-CM | POA: Diagnosis not present

## 2017-12-30 DIAGNOSIS — Z681 Body mass index (BMI) 19 or less, adult: Secondary | ICD-10-CM | POA: Diagnosis not present

## 2017-12-30 DIAGNOSIS — R55 Syncope and collapse: Secondary | ICD-10-CM | POA: Diagnosis not present

## 2017-12-30 DIAGNOSIS — G459 Transient cerebral ischemic attack, unspecified: Secondary | ICD-10-CM | POA: Diagnosis not present

## 2017-12-30 DIAGNOSIS — R279 Unspecified lack of coordination: Secondary | ICD-10-CM | POA: Diagnosis not present

## 2017-12-30 DIAGNOSIS — I6523 Occlusion and stenosis of bilateral carotid arteries: Secondary | ICD-10-CM | POA: Diagnosis not present

## 2017-12-30 DIAGNOSIS — Z7401 Bed confinement status: Secondary | ICD-10-CM | POA: Diagnosis not present

## 2017-12-30 DIAGNOSIS — I10 Essential (primary) hypertension: Secondary | ICD-10-CM | POA: Diagnosis not present

## 2017-12-30 DIAGNOSIS — F0390 Unspecified dementia without behavioral disturbance: Secondary | ICD-10-CM | POA: Diagnosis not present

## 2017-12-30 DIAGNOSIS — Z8679 Personal history of other diseases of the circulatory system: Secondary | ICD-10-CM | POA: Diagnosis not present

## 2017-12-30 DIAGNOSIS — F17210 Nicotine dependence, cigarettes, uncomplicated: Secondary | ICD-10-CM | POA: Diagnosis present

## 2017-12-30 DIAGNOSIS — J9600 Acute respiratory failure, unspecified whether with hypoxia or hypercapnia: Secondary | ICD-10-CM | POA: Diagnosis not present

## 2017-12-30 DIAGNOSIS — Z7982 Long term (current) use of aspirin: Secondary | ICD-10-CM | POA: Diagnosis not present

## 2017-12-30 DIAGNOSIS — I6389 Other cerebral infarction: Secondary | ICD-10-CM | POA: Diagnosis not present

## 2017-12-30 DIAGNOSIS — R4781 Slurred speech: Secondary | ICD-10-CM | POA: Diagnosis not present

## 2017-12-30 DIAGNOSIS — Z79899 Other long term (current) drug therapy: Secondary | ICD-10-CM | POA: Diagnosis not present

## 2017-12-30 DIAGNOSIS — R1312 Dysphagia, oropharyngeal phase: Secondary | ICD-10-CM | POA: Diagnosis present

## 2017-12-30 DIAGNOSIS — Z8673 Personal history of transient ischemic attack (TIA), and cerebral infarction without residual deficits: Secondary | ICD-10-CM | POA: Diagnosis not present

## 2017-12-30 DIAGNOSIS — R8271 Bacteriuria: Secondary | ICD-10-CM | POA: Diagnosis present

## 2017-12-30 DIAGNOSIS — I2510 Atherosclerotic heart disease of native coronary artery without angina pectoris: Secondary | ICD-10-CM | POA: Diagnosis not present

## 2017-12-30 DIAGNOSIS — I739 Peripheral vascular disease, unspecified: Secondary | ICD-10-CM | POA: Diagnosis present

## 2017-12-30 DIAGNOSIS — R627 Adult failure to thrive: Secondary | ICD-10-CM | POA: Diagnosis present

## 2017-12-30 DIAGNOSIS — M6281 Muscle weakness (generalized): Secondary | ICD-10-CM | POA: Diagnosis present

## 2017-12-30 DIAGNOSIS — R42 Dizziness and giddiness: Secondary | ICD-10-CM | POA: Diagnosis not present

## 2017-12-30 DIAGNOSIS — R29810 Facial weakness: Secondary | ICD-10-CM | POA: Diagnosis not present

## 2017-12-30 DIAGNOSIS — G8324 Monoplegia of upper limb affecting left nondominant side: Secondary | ICD-10-CM | POA: Diagnosis present

## 2017-12-30 DIAGNOSIS — E7800 Pure hypercholesterolemia, unspecified: Secondary | ICD-10-CM | POA: Diagnosis present

## 2017-12-30 DIAGNOSIS — G8321 Monoplegia of upper limb affecting right dominant side: Secondary | ICD-10-CM | POA: Diagnosis not present

## 2018-01-02 DIAGNOSIS — Z7401 Bed confinement status: Secondary | ICD-10-CM | POA: Diagnosis not present

## 2018-01-02 DIAGNOSIS — E559 Vitamin D deficiency, unspecified: Secondary | ICD-10-CM | POA: Diagnosis not present

## 2018-01-02 DIAGNOSIS — E860 Dehydration: Secondary | ICD-10-CM | POA: Diagnosis present

## 2018-01-02 DIAGNOSIS — R1312 Dysphagia, oropharyngeal phase: Secondary | ICD-10-CM | POA: Diagnosis present

## 2018-01-02 DIAGNOSIS — Z959 Presence of cardiac and vascular implant and graft, unspecified: Secondary | ICD-10-CM | POA: Diagnosis not present

## 2018-01-02 DIAGNOSIS — R402441 Other coma, without documented Glasgow coma scale score, or with partial score reported, in the field [EMT or ambulance]: Secondary | ICD-10-CM | POA: Diagnosis not present

## 2018-01-02 DIAGNOSIS — Z7982 Long term (current) use of aspirin: Secondary | ICD-10-CM | POA: Diagnosis not present

## 2018-01-02 DIAGNOSIS — L89153 Pressure ulcer of sacral region, stage 3: Secondary | ICD-10-CM | POA: Diagnosis present

## 2018-01-02 DIAGNOSIS — N179 Acute kidney failure, unspecified: Secondary | ICD-10-CM | POA: Diagnosis not present

## 2018-01-02 DIAGNOSIS — I2510 Atherosclerotic heart disease of native coronary artery without angina pectoris: Secondary | ICD-10-CM | POA: Diagnosis not present

## 2018-01-02 DIAGNOSIS — I739 Peripheral vascular disease, unspecified: Secondary | ICD-10-CM | POA: Diagnosis present

## 2018-01-02 DIAGNOSIS — E870 Hyperosmolality and hypernatremia: Secondary | ICD-10-CM | POA: Diagnosis not present

## 2018-01-02 DIAGNOSIS — G459 Transient cerebral ischemic attack, unspecified: Secondary | ICD-10-CM | POA: Diagnosis present

## 2018-01-02 DIAGNOSIS — E876 Hypokalemia: Secondary | ICD-10-CM | POA: Diagnosis present

## 2018-01-02 DIAGNOSIS — R29810 Facial weakness: Secondary | ICD-10-CM | POA: Diagnosis not present

## 2018-01-02 DIAGNOSIS — M6281 Muscle weakness (generalized): Secondary | ICD-10-CM | POA: Diagnosis present

## 2018-01-02 DIAGNOSIS — I639 Cerebral infarction, unspecified: Secondary | ICD-10-CM | POA: Diagnosis not present

## 2018-01-02 DIAGNOSIS — Z79899 Other long term (current) drug therapy: Secondary | ICD-10-CM | POA: Diagnosis not present

## 2018-01-02 DIAGNOSIS — I6359 Cerebral infarction due to unspecified occlusion or stenosis of other cerebral artery: Secondary | ICD-10-CM | POA: Diagnosis not present

## 2018-01-02 DIAGNOSIS — G9341 Metabolic encephalopathy: Secondary | ICD-10-CM | POA: Diagnosis not present

## 2018-01-02 DIAGNOSIS — Z6826 Body mass index (BMI) 26.0-26.9, adult: Secondary | ICD-10-CM | POA: Diagnosis not present

## 2018-01-02 DIAGNOSIS — R531 Weakness: Secondary | ICD-10-CM | POA: Diagnosis not present

## 2018-01-02 DIAGNOSIS — F0391 Unspecified dementia with behavioral disturbance: Secondary | ICD-10-CM | POA: Diagnosis not present

## 2018-01-02 DIAGNOSIS — G8321 Monoplegia of upper limb affecting right dominant side: Secondary | ICD-10-CM | POA: Diagnosis not present

## 2018-01-02 DIAGNOSIS — R4020 Unspecified coma: Secondary | ICD-10-CM | POA: Diagnosis not present

## 2018-01-02 DIAGNOSIS — S299XXA Unspecified injury of thorax, initial encounter: Secondary | ICD-10-CM | POA: Diagnosis not present

## 2018-01-02 DIAGNOSIS — Z8673 Personal history of transient ischemic attack (TIA), and cerebral infarction without residual deficits: Secondary | ICD-10-CM | POA: Diagnosis not present

## 2018-01-02 DIAGNOSIS — F0150 Vascular dementia without behavioral disturbance: Secondary | ICD-10-CM | POA: Diagnosis not present

## 2018-01-02 DIAGNOSIS — F0390 Unspecified dementia without behavioral disturbance: Secondary | ICD-10-CM | POA: Diagnosis not present

## 2018-01-02 DIAGNOSIS — R279 Unspecified lack of coordination: Secondary | ICD-10-CM | POA: Diagnosis not present

## 2018-01-02 DIAGNOSIS — R8271 Bacteriuria: Secondary | ICD-10-CM | POA: Diagnosis present

## 2018-01-02 DIAGNOSIS — E441 Mild protein-calorie malnutrition: Secondary | ICD-10-CM | POA: Diagnosis present

## 2018-01-02 DIAGNOSIS — Z9861 Coronary angioplasty status: Secondary | ICD-10-CM | POA: Diagnosis not present

## 2018-01-02 DIAGNOSIS — R4182 Altered mental status, unspecified: Secondary | ICD-10-CM | POA: Diagnosis not present

## 2018-01-02 DIAGNOSIS — Z8679 Personal history of other diseases of the circulatory system: Secondary | ICD-10-CM | POA: Diagnosis not present

## 2018-01-02 DIAGNOSIS — R627 Adult failure to thrive: Secondary | ICD-10-CM | POA: Diagnosis present

## 2018-01-02 DIAGNOSIS — R0902 Hypoxemia: Secondary | ICD-10-CM | POA: Diagnosis not present

## 2018-01-02 DIAGNOSIS — R4781 Slurred speech: Secondary | ICD-10-CM | POA: Diagnosis not present

## 2018-01-02 DIAGNOSIS — I10 Essential (primary) hypertension: Secondary | ICD-10-CM | POA: Diagnosis not present

## 2018-01-02 DIAGNOSIS — F0151 Vascular dementia with behavioral disturbance: Secondary | ICD-10-CM | POA: Diagnosis not present

## 2018-01-02 DIAGNOSIS — R400 Somnolence: Secondary | ICD-10-CM | POA: Diagnosis not present

## 2018-01-03 DIAGNOSIS — I739 Peripheral vascular disease, unspecified: Secondary | ICD-10-CM | POA: Diagnosis not present

## 2018-01-03 DIAGNOSIS — I6359 Cerebral infarction due to unspecified occlusion or stenosis of other cerebral artery: Secondary | ICD-10-CM | POA: Diagnosis not present

## 2018-01-03 DIAGNOSIS — I10 Essential (primary) hypertension: Secondary | ICD-10-CM | POA: Diagnosis not present

## 2018-01-03 DIAGNOSIS — E559 Vitamin D deficiency, unspecified: Secondary | ICD-10-CM | POA: Diagnosis not present

## 2018-01-06 DIAGNOSIS — I739 Peripheral vascular disease, unspecified: Secondary | ICD-10-CM | POA: Diagnosis not present

## 2018-01-06 DIAGNOSIS — I6359 Cerebral infarction due to unspecified occlusion or stenosis of other cerebral artery: Secondary | ICD-10-CM | POA: Diagnosis not present

## 2018-01-06 DIAGNOSIS — I10 Essential (primary) hypertension: Secondary | ICD-10-CM | POA: Diagnosis not present

## 2018-01-06 DIAGNOSIS — F0150 Vascular dementia without behavioral disturbance: Secondary | ICD-10-CM | POA: Diagnosis not present

## 2018-01-10 DIAGNOSIS — I639 Cerebral infarction, unspecified: Secondary | ICD-10-CM | POA: Diagnosis not present

## 2018-01-10 DIAGNOSIS — I10 Essential (primary) hypertension: Secondary | ICD-10-CM | POA: Diagnosis not present

## 2018-01-10 DIAGNOSIS — I739 Peripheral vascular disease, unspecified: Secondary | ICD-10-CM | POA: Diagnosis not present

## 2018-01-10 DIAGNOSIS — F0150 Vascular dementia without behavioral disturbance: Secondary | ICD-10-CM | POA: Diagnosis not present

## 2018-01-17 DIAGNOSIS — F0150 Vascular dementia without behavioral disturbance: Secondary | ICD-10-CM | POA: Diagnosis not present

## 2018-01-17 DIAGNOSIS — I739 Peripheral vascular disease, unspecified: Secondary | ICD-10-CM | POA: Diagnosis not present

## 2018-01-17 DIAGNOSIS — I6359 Cerebral infarction due to unspecified occlusion or stenosis of other cerebral artery: Secondary | ICD-10-CM | POA: Diagnosis not present

## 2018-01-17 DIAGNOSIS — I10 Essential (primary) hypertension: Secondary | ICD-10-CM | POA: Diagnosis not present

## 2018-01-20 DIAGNOSIS — R0902 Hypoxemia: Secondary | ICD-10-CM | POA: Diagnosis not present

## 2018-01-20 DIAGNOSIS — R4182 Altered mental status, unspecified: Secondary | ICD-10-CM | POA: Diagnosis not present

## 2018-01-20 DIAGNOSIS — E860 Dehydration: Secondary | ICD-10-CM | POA: Diagnosis not present

## 2018-01-20 DIAGNOSIS — I6359 Cerebral infarction due to unspecified occlusion or stenosis of other cerebral artery: Secondary | ICD-10-CM | POA: Diagnosis not present

## 2018-01-21 ENCOUNTER — Emergency Department (HOSPITAL_COMMUNITY): Payer: Medicare Other

## 2018-01-21 ENCOUNTER — Encounter (HOSPITAL_COMMUNITY): Payer: Self-pay

## 2018-01-21 ENCOUNTER — Inpatient Hospital Stay (HOSPITAL_COMMUNITY)
Admission: EM | Admit: 2018-01-21 | Discharge: 2018-01-25 | DRG: 682 | Disposition: A | Discharge status: Skilled Nursing Facility | Payer: Medicare Other | Attending: Internal Medicine | Admitting: Internal Medicine

## 2018-01-21 ENCOUNTER — Other Ambulatory Visit: Payer: Self-pay

## 2018-01-21 DIAGNOSIS — R627 Adult failure to thrive: Secondary | ICD-10-CM | POA: Diagnosis present

## 2018-01-21 DIAGNOSIS — E870 Hyperosmolality and hypernatremia: Secondary | ICD-10-CM | POA: Diagnosis not present

## 2018-01-21 DIAGNOSIS — Z8673 Personal history of transient ischemic attack (TIA), and cerebral infarction without residual deficits: Secondary | ICD-10-CM

## 2018-01-21 DIAGNOSIS — Z6826 Body mass index (BMI) 26.0-26.9, adult: Secondary | ICD-10-CM

## 2018-01-21 DIAGNOSIS — G9341 Metabolic encephalopathy: Secondary | ICD-10-CM

## 2018-01-21 DIAGNOSIS — Z959 Presence of cardiac and vascular implant and graft, unspecified: Secondary | ICD-10-CM | POA: Diagnosis not present

## 2018-01-21 DIAGNOSIS — F0390 Unspecified dementia without behavioral disturbance: Secondary | ICD-10-CM | POA: Diagnosis present

## 2018-01-21 DIAGNOSIS — F0391 Unspecified dementia with behavioral disturbance: Secondary | ICD-10-CM | POA: Diagnosis not present

## 2018-01-21 DIAGNOSIS — R400 Somnolence: Secondary | ICD-10-CM

## 2018-01-21 DIAGNOSIS — Z9861 Coronary angioplasty status: Secondary | ICD-10-CM

## 2018-01-21 DIAGNOSIS — E441 Mild protein-calorie malnutrition: Secondary | ICD-10-CM | POA: Diagnosis present

## 2018-01-21 DIAGNOSIS — Z7902 Long term (current) use of antithrombotics/antiplatelets: Secondary | ICD-10-CM | POA: Diagnosis not present

## 2018-01-21 DIAGNOSIS — L89153 Pressure ulcer of sacral region, stage 3: Secondary | ICD-10-CM | POA: Diagnosis present

## 2018-01-21 DIAGNOSIS — E860 Dehydration: Secondary | ICD-10-CM | POA: Diagnosis present

## 2018-01-21 DIAGNOSIS — E876 Hypokalemia: Secondary | ICD-10-CM | POA: Diagnosis present

## 2018-01-21 DIAGNOSIS — R2689 Other abnormalities of gait and mobility: Secondary | ICD-10-CM | POA: Diagnosis not present

## 2018-01-21 DIAGNOSIS — R4020 Unspecified coma: Secondary | ICD-10-CM | POA: Diagnosis not present

## 2018-01-21 DIAGNOSIS — M6281 Muscle weakness (generalized): Secondary | ICD-10-CM | POA: Diagnosis not present

## 2018-01-21 DIAGNOSIS — F0150 Vascular dementia without behavioral disturbance: Secondary | ICD-10-CM | POA: Diagnosis not present

## 2018-01-21 DIAGNOSIS — L8990 Pressure ulcer of unspecified site, unspecified stage: Secondary | ICD-10-CM

## 2018-01-21 DIAGNOSIS — E871 Hypo-osmolality and hyponatremia: Secondary | ICD-10-CM | POA: Diagnosis not present

## 2018-01-21 DIAGNOSIS — R531 Weakness: Secondary | ICD-10-CM | POA: Diagnosis not present

## 2018-01-21 DIAGNOSIS — N179 Acute kidney failure, unspecified: Principal | ICD-10-CM | POA: Diagnosis present

## 2018-01-21 DIAGNOSIS — Z79899 Other long term (current) drug therapy: Secondary | ICD-10-CM

## 2018-01-21 DIAGNOSIS — Z7982 Long term (current) use of aspirin: Secondary | ICD-10-CM | POA: Diagnosis not present

## 2018-01-21 DIAGNOSIS — S299XXA Unspecified injury of thorax, initial encounter: Secondary | ICD-10-CM | POA: Diagnosis not present

## 2018-01-21 DIAGNOSIS — N289 Disorder of kidney and ureter, unspecified: Secondary | ICD-10-CM | POA: Diagnosis not present

## 2018-01-21 DIAGNOSIS — I10 Essential (primary) hypertension: Secondary | ICD-10-CM | POA: Diagnosis present

## 2018-01-21 DIAGNOSIS — I739 Peripheral vascular disease, unspecified: Secondary | ICD-10-CM | POA: Diagnosis present

## 2018-01-21 DIAGNOSIS — L89323 Pressure ulcer of left buttock, stage 3: Secondary | ICD-10-CM | POA: Diagnosis not present

## 2018-01-21 DIAGNOSIS — F0151 Vascular dementia with behavioral disturbance: Secondary | ICD-10-CM | POA: Diagnosis not present

## 2018-01-21 DIAGNOSIS — R1312 Dysphagia, oropharyngeal phase: Secondary | ICD-10-CM | POA: Diagnosis not present

## 2018-01-21 DIAGNOSIS — I2510 Atherosclerotic heart disease of native coronary artery without angina pectoris: Secondary | ICD-10-CM | POA: Diagnosis present

## 2018-01-21 DIAGNOSIS — R4182 Altered mental status, unspecified: Secondary | ICD-10-CM | POA: Diagnosis not present

## 2018-01-21 DIAGNOSIS — R402441 Other coma, without documented Glasgow coma scale score, or with partial score reported, in the field [EMT or ambulance]: Secondary | ICD-10-CM | POA: Diagnosis not present

## 2018-01-21 HISTORY — DX: Unspecified dementia, unspecified severity, without behavioral disturbance, psychotic disturbance, mood disturbance, and anxiety: F03.90

## 2018-01-21 HISTORY — DX: Essential (primary) hypertension: I10

## 2018-01-21 HISTORY — DX: Peripheral vascular disease, unspecified: I73.9

## 2018-01-21 HISTORY — DX: Cerebral infarction, unspecified: I63.9

## 2018-01-21 LAB — CBC WITH DIFFERENTIAL/PLATELET
BASOS ABS: 0 10*3/uL (ref 0.0–0.1)
Basophils Relative: 0 %
Eosinophils Absolute: 0.3 10*3/uL (ref 0.0–0.7)
Eosinophils Relative: 3 %
HEMATOCRIT: 52.8 % — AB (ref 36.0–46.0)
Hemoglobin: 16.1 g/dL — ABNORMAL HIGH (ref 12.0–15.0)
LYMPHS PCT: 26 %
Lymphs Abs: 2.6 10*3/uL (ref 0.7–4.0)
MCH: 29 pg (ref 26.0–34.0)
MCHC: 30.5 g/dL (ref 30.0–36.0)
MCV: 95 fL (ref 78.0–100.0)
MONO ABS: 0.6 10*3/uL (ref 0.1–1.0)
Monocytes Relative: 6 %
NEUTROS ABS: 6.7 10*3/uL (ref 1.7–7.7)
Neutrophils Relative %: 65 %
Platelets: 189 10*3/uL (ref 150–400)
RBC: 5.56 MIL/uL — ABNORMAL HIGH (ref 3.87–5.11)
RDW: 16.3 % — AB (ref 11.5–15.5)
WBC: 10.2 10*3/uL (ref 4.0–10.5)

## 2018-01-21 LAB — COMPREHENSIVE METABOLIC PANEL
ALBUMIN: 3.5 g/dL (ref 3.5–5.0)
ALT: 48 U/L (ref 14–54)
ANION GAP: 11 (ref 5–15)
AST: 38 U/L (ref 15–41)
Alkaline Phosphatase: 82 U/L (ref 38–126)
BILIRUBIN TOTAL: 1.1 mg/dL (ref 0.3–1.2)
BUN: 42 mg/dL — ABNORMAL HIGH (ref 6–20)
CO2: 23 mmol/L (ref 22–32)
Calcium: 8.9 mg/dL (ref 8.9–10.3)
Chloride: 124 mmol/L — ABNORMAL HIGH (ref 101–111)
Creatinine, Ser: 1.53 mg/dL — ABNORMAL HIGH (ref 0.44–1.00)
GFR calc Af Amer: 38 mL/min — ABNORMAL LOW (ref >60)
GFR calc non Af Amer: 33 mL/min — ABNORMAL LOW (ref >60)
GLUCOSE: 136 mg/dL — AB (ref 65–99)
POTASSIUM: 3.5 mmol/L (ref 3.5–5.1)
SODIUM: 158 mmol/L — AB (ref 135–145)
TOTAL PROTEIN: 6.7 g/dL (ref 6.5–8.1)

## 2018-01-21 LAB — URINALYSIS, COMPLETE (UACMP) WITH MICROSCOPIC
BILIRUBIN URINE: NEGATIVE
Glucose, UA: NEGATIVE mg/dL
HGB URINE DIPSTICK: NEGATIVE
Ketones, ur: NEGATIVE mg/dL
LEUKOCYTES UA: NEGATIVE
Nitrite: POSITIVE — AB
Protein, ur: NEGATIVE mg/dL
Specific Gravity, Urine: 1.02 (ref 1.005–1.030)
pH: 5 (ref 5.0–8.0)

## 2018-01-21 LAB — I-STAT CG4 LACTIC ACID, ED: Lactic Acid, Venous: 1.44 mmol/L (ref 0.5–1.9)

## 2018-01-21 LAB — I-STAT TROPONIN, ED: Troponin i, poc: 0 ng/mL (ref 0.00–0.08)

## 2018-01-21 LAB — AMMONIA: AMMONIA: 29 umol/L (ref 9–35)

## 2018-01-21 MED ORDER — SODIUM CHLORIDE 0.9 % IV BOLUS (SEPSIS)
1000.0000 mL | Freq: Once | INTRAVENOUS | Status: AC
  Administered 2018-01-21: 1000 mL via INTRAVENOUS

## 2018-01-21 MED ORDER — CLOPIDOGREL BISULFATE 75 MG PO TABS
75.0000 mg | ORAL_TABLET | Freq: Every day | ORAL | Status: DC
  Administered 2018-01-22 – 2018-01-25 (×4): 75 mg via ORAL
  Filled 2018-01-21 (×3): qty 1

## 2018-01-21 MED ORDER — SODIUM CHLORIDE 0.9 % IV SOLN: INTRAVENOUS | Status: DC

## 2018-01-21 MED ORDER — ACETAMINOPHEN 650 MG RE SUPP: 650.0000 mg | Freq: Four times a day (QID) | RECTAL | Status: DC | PRN

## 2018-01-21 MED ORDER — ENOXAPARIN SODIUM 40 MG/0.4ML ~~LOC~~ SOLN
40.0000 mg | SUBCUTANEOUS | Status: DC
  Administered 2018-01-21 – 2018-01-24 (×4): 40 mg via SUBCUTANEOUS
  Filled 2018-01-21 (×4): qty 0.4

## 2018-01-21 MED ORDER — ONDANSETRON HCL 4 MG/2ML IJ SOLN: 4.0000 mg | Freq: Four times a day (QID) | INTRAMUSCULAR | Status: DC | PRN

## 2018-01-21 MED ORDER — ONDANSETRON HCL 4 MG PO TABS: 4.0000 mg | ORAL_TABLET | Freq: Four times a day (QID) | ORAL | Status: DC | PRN

## 2018-01-21 MED ORDER — SODIUM CHLORIDE 0.9% FLUSH
3.0000 mL | Freq: Two times a day (BID) | INTRAVENOUS | Status: DC
  Administered 2018-01-21 – 2018-01-25 (×5): 3 mL via INTRAVENOUS

## 2018-01-21 MED ORDER — ASPIRIN EC 81 MG PO TBEC
81.0000 mg | DELAYED_RELEASE_TABLET | Freq: Every day | ORAL | Status: DC
  Administered 2018-01-22 – 2018-01-25 (×4): 81 mg via ORAL
  Filled 2018-01-21 (×4): qty 1

## 2018-01-21 MED ORDER — SODIUM CHLORIDE 0.45 % IV SOLN: INTRAVENOUS | Status: DC

## 2018-01-21 MED ORDER — ACETAMINOPHEN 325 MG PO TABS: 650.0000 mg | ORAL_TABLET | Freq: Four times a day (QID) | ORAL | Status: DC | PRN

## 2018-01-21 NOTE — H&P (Signed)
History and Physical    Morgan Sutton:[id number] DOB: [birth year] DOA: 01/21/2018  **Will admit patient based on the expectation that the patient will need hospitalization/ hospital care that crosses at least 2 midnights  PCP: Eunice Blase, PA-C   Attending physician: Morgan Sutton  Patient coming from/Resides with: SNF/[facility]  Chief Complaint: Altered mental status  HPI: Morgan Sutton is a 72 y.o. female with medical history significant for peripheral vascular disease status post iliac stents, recent acute CVA February 2019, dementia with psychotic features, and hypertension.  Patient was sent from nursing facility after family and staff noticed increased lethargy and failure to thrive over 4 days.  Patient refused medications last night.  Patient slow to respond but oriented to self and attempting to answer questions.  Labs revealed acute hyponatremia with a sodium of 158, acute kidney injury with creatinine increasing from a baseline of 0.89 to 1.53, hemoglobin is elevated at 16.1 from a baseline of 10.5 revealing hemoconcentration in the setting of acute dehydration.  ED Course:  Vital Signs: BP 98/67   Pulse 66   Temp 97.9 F (36.6 C) (Oral)   Resp 14   Ht 5\' 5"  (1.651 m)   Wt 79.4 kg (175 lb)   SpO2 96%   BMI 29.12 kg/m  CXR: Neg CT head without contrast: No acute changes Lab data: Sodium 158, potassium 3.5, chloride 124, CO2 23, glucose 136, BUN 42, creatinine 1.53, LFTs not elevated, poc troponin 0.00, lactic acid 1.44, white count 10,200, neutrophils 65% with absolute neutrophils 6.7%, hemoglobin 16.1, platelets 189,000, urinalysis abnormal with cloudy appearance, positive nitrite, specific gravity 1.020, many bacteria, 0-5 WBCs, urine culture pending Medications and treatments: Saline bolus times 1 L  Review of Systems:  **Unable to obtain from patient given altered mentation history obtained from electronic medical record as well as to documentation provided from  nursing facility   Past Medical History:  Diagnosis Date  . Cerebral infarction Feb 2019   . Dementia   . Hypertension   . Peripheral vascular disease (HCC)   . Stroke [facility])     History reviewed. No pertinent surgical history.  Social History   Socioeconomic History  . Marital status: Widowed    Spouse name: Not on file  . Number of children: Not on file  . Years of education: Not on file  . Highest education level: Not on file  Social Needs  . Financial resource strain: Not on file  . Food insecurity - worry: Not on file  . Food insecurity - inability: Not on file  . Transportation needs - medical: Not on file  . Transportation needs - non-medical: Not on file  Occupational History  . Not on file  Tobacco Use  . Smoking status: Unknown If Ever Smoked  Substance and Sexual Activity  . Alcohol use: No    Frequency: Never  . Drug use: No  . Sexual activity: Not on file  Other Topics Concern  . Not on file  Social History Narrative  . Not on file    Mobility: Rolling walker/wheelchair Work history: Not obtained   No Known Allergies   Family history reviewed from electronic medical record -unable to obtain from patient secondary to altered mentation  Prior to Admission medications   Medication Sig Start Date End Date Taking? Authorizing Provider  amLODipine (NORVASC) 10 MG tablet Take 10 mg by mouth daily.   Yes [provider]  aspirin EC 81 MG tablet Take 81 mg by  mouth daily.   Yes [provider]  Cholecalciferol (VITAMIN D3) 10000 units TABS Take 30,000 Units by mouth daily.   Yes [provider]  clopidogrel (PLAVIX) 75 MG tablet Take 75 mg by mouth daily.   Yes [provider]  cyanocobalamin (,VITAMIN B-12,) 1000 MCG/ML injection Inject 1,000 mcg into the muscle every 30 (thirty) days. On or about the 12th of each month   Yes [provider]  donepezil (ARICEPT) 10 MG tablet Take 10 mg by mouth at bedtime.    Yes [provider]  hydrochlorothiazide (HYDRODIURIL) 12.5 MG tablet Take 12.5 mg by mouth daily.   Yes [provider]  losartan (COZAAR) 100 MG tablet Take 100 mg by mouth daily.   Yes [provider]    Physical Exam: Vitals:   01/21/18 1418 01/21/18 1421 01/21/18 1515 01/21/18 1530  BP:  100/74 99/70 98/67   Pulse:  75 70 66  Resp:  14 20 14   Temp:  97.9 F (36.6 C)    TempSrc:  Oral    SpO2: 94% 96% 95% 96%  Weight:  79.4 kg (175 lb)    Height:  5\' 5"  (1.651 m)        Constitutional: NAD, calm, comfortable-there is somewhat older than stated age Eyes: PERRL, lids and conjunctivae normal ENMT: Mucous membranes are very dry. Posterior pharynx clear of any exudate or lesions.Normal dentition.  Neck: normal, supple, no masses, no thyromegaly Respiratory: clear to auscultation bilaterally, no wheezing, no crackles. Normal respiratory effort. No accessory muscle use.  Cardiovascular: Regular rate and rhythm, no murmurs / rubs / gallops. No extremity edema. 2+ pedal pulses. No carotid bruits.  Abdomen: no tenderness, no masses palpated. No hepatosplenomegaly. Bowel sounds positive.  Musculoskeletal: no clubbing / cyanosis. No joint deformity upper and lower extremities. Good ROM, no contractures. Normal muscle tone.  Skin: no rashes, lesions, ulcers. No induration Neurologic: CN 2-12 grossly intact. Sensation intact, DTR normal. Strength donated at 3/5 x all 4 extremities exam limited by patient's ability to participate.  She was unable to hold either lower extremity in a flexed position Psychiatric: Lethargic, slow to respond but attempts to answer questions posed to her.   Labs on Admission: I have personally reviewed following labs and imaging studies  CBC: Recent Labs  Lab 01/21/18 1510  WBC 10.2  NEUTROABS 6.7  HGB 16.1*  HCT 52.8*  MCV 95.0  PLT 189   Basic Metabolic Panel: Recent Labs  Lab 01/21/18 1510  NA 158*  K 3.5  CL 124*    CO2 23  GLUCOSE 136*  BUN 42*  CREATININE 1.53*  CALCIUM 8.9   GFR: Estimated Creatinine Clearance: 35.1 mL/min (A) (by C-G formula based on SCr of 1.53 mg/dL (H)). Liver Function Tests: Recent Labs  Lab 01/21/18 1510  AST 38  ALT 48  ALKPHOS 82  BILITOT 1.1  PROT 6.7  ALBUMIN 3.5   No results for input(s): LIPASE, AMYLASE in the last 168 hours. Recent Labs  Lab 01/21/18 1510  AMMONIA 29   Coagulation Profile: No results for input(s): INR, PROTIME in the last 168 hours. Cardiac Enzymes: No results for input(s): CKTOTAL, CKMB, CKMBINDEX, TROPONINI in the last 168 hours. BNP (last 3 results) No results for input(s): PROBNP in the last 8760 hours. HbA1C: No results for input(s): HGBA1C in the last 72 hours. CBG: No results for input(s): GLUCAP in the last 168 hours. Lipid Profile: No results for input(s): CHOL, HDL, LDLCALC, TRIG, CHOLHDL, LDLDIRECT in  the last 72 hours. Thyroid Function Tests: No results for input(s): TSH, T4TOTAL, FREET4, T3FREE, THYROIDAB in the last 72 hours. Anemia Panel: No results for input(s): VITAMINB12, FOLATE, FERRITIN, TIBC, IRON, RETICCTPCT in the last 72 hours. Urine analysis:    Component Value Date/Time   COLORURINE YELLOW 01/21/2018 1628   APPEARANCEUR CLOUDY (A) 01/21/2018 1628   LABSPEC 1.020 01/21/2018 1628   PHURINE 5.0 01/21/2018 1628   GLUCOSEU NEGATIVE 01/21/2018 1628   HGBUR NEGATIVE 01/21/2018 1628   BILIRUBINUR NEGATIVE 01/21/2018 1628   KETONESUR NEGATIVE 01/21/2018 1628   PROTEINUR NEGATIVE 01/21/2018 1628   UROBILINOGEN 0.2 12/12/2007 1020   NITRITE POSITIVE (A) 01/21/2018 1628   LEUKOCYTESUR NEGATIVE 01/21/2018 1628   Sepsis Labs: @LABRCNTIP (procalcitonin:4,lacticidven:4) )No results found for this or any previous visit (from the past 240 hour(s)).   Radiological Exams on Admission: Ct Head Wo Contrast  Result Date: 01/21/2018 CLINICAL DATA:  72 year old female with altered level of consciousness. EXAM: CT  HEAD WITHOUT CONTRAST TECHNIQUE: Contiguous axial images were obtained from the base of the skull through the vertex without intravenous contrast. COMPARISON:  12/30/2017 CT and prior studies FINDINGS: Brain: No evidence of acute infarction, hemorrhage, hydrocephalus, extra-axial collection or mass lesion/mass effect. Atrophy, chronic small-vessel white matter ischemic changes and remote thalamic, basal ganglia and cerebellar infarcts again noted. Vascular: Atherosclerotic calcifications noted Skull: No acute abnormality Sinuses/Orbits: No acute abnormality. Other: None. IMPRESSION: 1. No evidence of acute intracranial abnormality. 2. Atrophy, chronic small-vessel white matter ischemic changes and remote infarcts as described. Electronically Signed   By: Harmon Pier M.D.   On: 01/21/2018 16:20   Dg Chest Port 1 View  Result Date: 01/21/2018 CLINICAL DATA:  Observed by the RN and family to be more lethargic and failure to thrive over the last 4 days. Pt following all commands, has hx of dementia and is oriented to self. EXAM: PORTABLE CHEST 1 VIEW COMPARISON:  12/30/2017 FINDINGS: The heart size and mediastinal contours are within normal limits. Both lungs are clear. The visualized skeletal structures are unremarkable. IMPRESSION: No active disease. Electronically Signed   By: Elige Ko   On: 01/21/2018 15:05    EKG: (Independently reviewed) sinus rhythm with ventricular rate 65 bpm, QTC 401 ms, frequent PACs, early R wave progression, downsloping ST segments in inferior lateral leads consistent with likely LV strain/LVH  Assessment/Plan Principal Problem:   Acute kidney injury  -Presents with altered mentation and failure to thrive with clinical evaluation consistent with profound dehydration -Baseline renal function: 19 /0.89 -current renal function 42/1.53 -Half-normal saline at 100/hr -Follow labs -Hold preadmission ARB and HCTZ  Active Problems:   Acute hypernatremia -Secondary to severe  dehydration -Fluids as above -Follow electrolytes    Acute metabolic encephalopathy -Secondary to severe hyponatremia in the context of known underlying dementia with psychotic features -Neurological checks every 4 hours -NPO until more alert -UA c/w profound DH- anbxs only if urine cx positive or develops fever or other sx's    History of CVA (cerebrovascular accident) Feb 2019 -Now resides at skilled nursing facility -On baby aspirin, Plavix but no statin listed on nursing facility [facility]    Hypertension -Current blood pressure suboptimal in setting of dehydration -Adding preadmission Cozaar and hydrochlorothiazide as well as Norvasc    Dementia with history of psychotic features and paranoia -Patient is confused but is not actively hallucinating and is not agitated or combative -Hold Aricept for now -Neurological checks as above    PAD (peripheral artery disease)     **  Additional lab, imaging and/or diagnostic evaluation at discretion of supervising physician  DVT prophylaxis: Lovenox Code Status: Full code Family Communication: No family at bedside; attending MD attempted to notify family by telephone but unable to reach Disposition Plan: SNF Consults called: None    Jmarion Christiano L. ANP-BC [facility] Pager [phone]   If 7PM-7AM, please contact night-coverage [email or web address] Password [email or web address]  01/21/2018, 5:20 PM

## 2018-01-21 NOTE — Progress Notes (Signed)
Tashima Dionisio PaschalJ Warshawsky is a 72 y.o. female patient admitted from ED awake, disoriented X4- no acute distress noted.  VSS - Blood pressure 119/71, pulse 61, temperature 97.7 F (36.5 C), temperature source Oral, resp. rate 18, height 5\' 6"  (1.676 m), weight 71 kg (156 lb 8 oz), SpO2 100 %.    IV in place, occlusive dsg intact without redness.  Orientation to room, and floor completed with information packet given to patient/family.  Patient declined safety video at this time.  Admission INP armband ID verified with patient/family, and in place.   SR up x 2, fall assessment complete, with patient and family able to verbalize understanding of risk associated with falls, and verbalized understanding to call nsg before up out of bed.  Call light within reach, patient able to voice, and demonstrate understanding.  Skin, clean-dry- intact with stage two pressure ulcer on the sacrum, and abbrassion on anterior right lower extremity .    Will cont to eval and treat per MD orders.  Melvenia NeedlesIreti O Caly Pellum, RN 01/21/2018 7:24 PM

## 2018-01-21 NOTE — ED Provider Notes (Signed)
[facility] EMERGENCY DEPARTMENT Provider Note   CSN: [id number] Arrival date & time: 01/21/18  1357     History   Chief Complaint Chief Complaint  Patient presents with  . Altered Mental Status    HPI    level 5 caveat secondary to dementia. Morgan Sutton is a 72 y.o. female.  Patient brought here by EMS from facility with reported increased lethargy and failure to thrive over the last 4 days.  Patient answering simple yes no to questions and denies any complaints.  She is unable to give any history and no family is present currently.  HPI  Past Medical History:  Diagnosis Date  . Cerebral infarction (HCC)   . Dementia   . Hypertension   . Peripheral vascular disease (HCC)   . Stroke [facility])     There are no active problems to display for this patient.   History reviewed. No pertinent surgical history.  OB History    No data available       Home Medications    Prior to Admission medications   Not on File    Family History History reviewed. No pertinent family history.  Social History Social History   Tobacco Use  . Smoking status: Unknown If Ever Smoked  Substance Use Topics  . Alcohol use: No    Frequency: Never  . Drug use: No     Allergies   Patient has no known allergies.   Review of Systems Review of Systems  Unable to perform ROS: Dementia     Physical Exam Updated Vital Signs BP 100/74 (BP Location: Right Arm)   Pulse 75   Temp 97.9 F (36.6 C) (Oral)   Resp 14   Ht 5\' 5"  (1.651 m)   Wt 79.4 kg (175 lb)   SpO2 96%   BMI 29.12 kg/m   Physical Exam  Constitutional: She appears well-developed and well-nourished. No distress.  HENT:  Head: Normocephalic and atraumatic.  Eyes: Conjunctivae are normal.  Neck: Neck supple.  Cardiovascular: Normal rate and normal pulses. An irregular rhythm present.  No murmur heard. Pulmonary/Chest: Effort normal and breath sounds normal. No respiratory distress.  Abdominal:  Soft. There is no tenderness.  Musculoskeletal: She exhibits no edema.  Neurological: She is alert. She has normal strength. No cranial nerve deficit or sensory deficit. GCS eye subscore is 4. GCS verbal subscore is 5. GCS motor subscore is 6.  Awake alert and following commands.  Skin: Skin is warm and dry.  Psychiatric: She has a normal mood and affect.  Nursing note and vitals reviewed.    ED Treatments / Results  Labs (all labs ordered are listed, but only abnormal results are displayed) Labs Reviewed  COMPREHENSIVE METABOLIC PANEL - Abnormal; Notable for the following components:      Result Value   Sodium 158 (*)    Chloride 124 (*)    Glucose, Bld 136 (*)    BUN 42 (*)    Creatinine, Ser 1.53 (*)    GFR calc non Af Amer 33 (*)    GFR calc Af Amer 38 (*)    All other components within normal limits  CBC WITH DIFFERENTIAL/PLATELET - Abnormal; Notable for the following components:   RBC 5.56 (*)    Hemoglobin 16.1 (*)    HCT 52.8 (*)    RDW 16.3 (*)    All other components within normal limits  URINALYSIS, COMPLETE (UACMP) WITH MICROSCOPIC - Abnormal; Notable for the following  components:   APPearance CLOUDY (*)    Nitrite POSITIVE (*)    Bacteria, UA MANY (*)    Squamous Epithelial / LPF 0-5 (*)    All other components within normal limits  COMPREHENSIVE METABOLIC PANEL - Abnormal; Notable for the following components:   Sodium 157 (*)    Chloride 125 (*)    Glucose, Bld 121 (*)    BUN 36 (*)    Creatinine, Ser 1.20 (*)    Calcium 8.7 (*)    Total Protein 6.0 (*)    Albumin 3.1 (*)    Total Bilirubin 1.4 (*)    GFR calc non Af Amer 44 (*)    GFR calc Af Amer 51 (*)    All other components within normal limits  CBC - Abnormal; Notable for the following components:   HCT 48.8 (*)    RDW 16.3 (*)    Platelets 138 (*)    All other components within normal limits  URINE CULTURE  AMMONIA  I-STAT TROPONIN, ED  I-STAT CG4 LACTIC ACID, ED  CBG MONITORING, ED    I-STAT CG4 LACTIC ACID, ED    EKG  EKG Interpretation  Date/Time:  Friday January 21 2018 14:19:17 EST Ventricular Rate:  65 PR Interval:    QRS Duration: 106 QT Interval:  385 QTC Calculation: 401 R Axis:   63 Text Interpretation:  Sinus rhythm Atrial premature complexes Borderline ST depression, diffuse leads Abnormal T, consider ischemia, diffuse leads compared with 10/03 Confirmed by Meridee Score [phone]) on 01/21/2018 2:49:19 PM       Radiology Ct Head Wo Contrast  Result Date: 01/21/2018 CLINICAL DATA:  72 year old female with altered level of consciousness. EXAM: CT HEAD WITHOUT CONTRAST TECHNIQUE: Contiguous axial images were obtained from the base of the skull through the vertex without intravenous contrast. COMPARISON:  12/30/2017 CT and prior studies FINDINGS: Brain: No evidence of acute infarction, hemorrhage, hydrocephalus, extra-axial collection or mass lesion/mass effect. Atrophy, chronic small-vessel white matter ischemic changes and remote thalamic, basal ganglia and cerebellar infarcts again noted. Vascular: Atherosclerotic calcifications noted Skull: No acute abnormality Sinuses/Orbits: No acute abnormality. Other: None. IMPRESSION: 1. No evidence of acute intracranial abnormality. 2. Atrophy, chronic small-vessel white matter ischemic changes and remote infarcts as described. Electronically Signed   By: Harmon Pier M.D.   On: 01/21/2018 16:20   Dg Chest Port 1 View  Result Date: 01/21/2018 CLINICAL DATA:  Observed by the RN and family to be more lethargic and failure to thrive over the last 4 days. Pt following all commands, has hx of dementia and is oriented to self. EXAM: PORTABLE CHEST 1 VIEW COMPARISON:  12/30/2017 FINDINGS: The heart size and mediastinal contours are within normal limits. Both lungs are clear. The visualized skeletal structures are unremarkable. IMPRESSION: No active disease. Electronically Signed   By: Elige Ko   On: 01/21/2018 15:05     Procedures Procedures (including critical care time)  Medications Ordered in ED Medications  sodium chloride 0.9 % bolus 1,000 mL (not administered)     Initial Impression / Assessment and Plan / ED Course  I have reviewed the triage vital signs and the nursing notes.  Pertinent labs & imaging results that were available during my care of the patient were reviewed by me and considered in my medical decision making (see chart for details).  Clinical Course as of Jan 23 1055  Fri Jan 21, 2018  1645 Discussed with the hospitalist service who will admit the patient  to their team.  [MB]    Clinical Course User Index [MB] Terrilee FilesButler, Blessing Ozga C, MD      Final Clinical Impressions(s) / ED Diagnoses   Final diagnoses:  Somnolence  Hypernatremia    ED Discharge Orders    None       Terrilee FilesButler, Chenelle Benning C, MD 01/22/18 1057

## 2018-01-21 NOTE — ED Triage Notes (Signed)
Per [facility], pt from [facility] where the pt has been observed by the RN and family to be more lethargic and failure to thrive over the last 4 days. Pt following all commands, has hx of dementia and is oriented to self. VSS. Full code.

## 2018-01-21 NOTE — ED Notes (Signed)
Patient transported to CT 

## 2018-01-21 NOTE — ED Notes (Addendum)
Pt's sister, Willodean RosenthalSybil Brown [phone] would like to be called with updates

## 2018-01-21 NOTE — Progress Notes (Signed)
Report received from the Ed. Pt. Arrived to the unit via bed at 1815.

## 2018-01-22 DIAGNOSIS — F0151 Vascular dementia with behavioral disturbance: Secondary | ICD-10-CM

## 2018-01-22 LAB — COMPREHENSIVE METABOLIC PANEL
ALBUMIN: 3.1 g/dL — AB (ref 3.5–5.0)
ALT: 44 U/L (ref 14–54)
ANION GAP: 9 (ref 5–15)
AST: 36 U/L (ref 15–41)
Alkaline Phosphatase: 74 U/L (ref 38–126)
BUN: 36 mg/dL — ABNORMAL HIGH (ref 6–20)
CO2: 23 mmol/L (ref 22–32)
CREATININE: 1.2 mg/dL — AB (ref 0.44–1.00)
Calcium: 8.7 mg/dL — ABNORMAL LOW (ref 8.9–10.3)
Chloride: 125 mmol/L — ABNORMAL HIGH (ref 101–111)
GFR calc non Af Amer: 44 mL/min — ABNORMAL LOW (ref >60)
GFR, EST AFRICAN AMERICAN: 51 mL/min — AB (ref >60)
Glucose, Bld: 121 mg/dL — ABNORMAL HIGH (ref 65–99)
Potassium: 3.9 mmol/L (ref 3.5–5.1)
Sodium: 157 mmol/L — ABNORMAL HIGH (ref 135–145)
Total Bilirubin: 1.4 mg/dL — ABNORMAL HIGH (ref 0.3–1.2)
Total Protein: 6 g/dL — ABNORMAL LOW (ref 6.5–8.1)

## 2018-01-22 LAB — CBC
HCT: 48.8 % — ABNORMAL HIGH (ref 36.0–46.0)
HEMOGLOBIN: 14.7 g/dL (ref 12.0–15.0)
MCH: 28.9 pg (ref 26.0–34.0)
MCHC: 30.1 g/dL (ref 30.0–36.0)
MCV: 96.1 fL (ref 78.0–100.0)
Platelets: 138 10*3/uL — ABNORMAL LOW (ref 150–400)
RBC: 5.08 MIL/uL (ref 3.87–5.11)
RDW: 16.3 % — ABNORMAL HIGH (ref 11.5–15.5)
WBC: 8.2 10*3/uL (ref 4.0–10.5)

## 2018-01-22 LAB — MRSA PCR SCREENING: MRSA BY PCR: NEGATIVE

## 2018-01-22 MED ORDER — DEXTROSE 5 % IV SOLN
INTRAVENOUS | Status: DC
  Administered 2018-01-22 (×2): via INTRAVENOUS

## 2018-01-22 MED ORDER — DONEPEZIL HCL 10 MG PO TABS
10.0000 mg | ORAL_TABLET | Freq: Every day | ORAL | Status: DC
  Administered 2018-01-22 – 2018-01-24 (×3): 10 mg via ORAL
  Filled 2018-01-22 (×3): qty 1

## 2018-01-22 NOTE — Progress Notes (Signed)
PROGRESS NOTE        PATIENT DETAILS Name: Morgan Sutton Age: 72 y.o. Sex: female Date of Birth: Dec 23, 1945 Admit Date: 01/21/2018 Admitting Physician Dewayne ShorterShanker Levora DredgeM Brienna Bass, MD PCP:O'Buch, Edgardo RoysGreta, PA-C  Brief Narrative: Patient is a 72 y.o. female with history of recent acute CVA in February 2019, dementia, peripheral vascular disease status post bilateral PCI in 2017 transferred from skilled nursing facility for evaluation of altered mental status, found to have hypernatremia and acute kidney injury.  Subjective: Awake-pleasantly confused-answers some questions appropriately.  Assessment/Plan: Acute metabolic encephalopathy: Secondary to hypernatremia and acute kidney injury, slowly improving with supportive care.  Suspect mental status back to baseline.  CT head negative for acute abnormalities on admission.  Hypernatremia: Suspect secondary to poor oral intake-in the use of diuretics.  Started on half-normal NS on admission-sodium minimally improved-we will switch over to D5W.  Repeat electrolytes tomorrow.  Acute kidney injury: Hemodynamically mediated-secondary to poor oral intake, use of diuretics and losartan.  Improving with gentle hydration.  Recent CVA: No focal deficits-continue antiplatelet agents.  Await evaluation by rehab services.  History of peripheral vascular disease status post PCI in 2017 bilateral iliac arteries: Legs appear warm to touch-continue antiplatelet agents.  Hypertension: Controlled-continue to hold diuretics, amlodipine and losartan.  CAD: No anginal symptoms-continue antiplatelets  Dementia: Suspect at baseline-mental status is back to baseline-following simple commands-but mostly pleasantly confused.  Resume Aricept  DVT Prophylaxis: Prophylactic Lovenox   Code Status: Full code   Family Communication: None at bedside-attempted to reach out to family on admission-unsuccessful-we will retry later today  Disposition  Plan: Remain inpatient-SNF on discharge-in the next 1-2 days  Antimicrobial agents: Anti-infectives (From admission, onward)   None     Procedures: None  CONSULTS:  None  Time spent: 25 minutes-Greater than 50% of this time was spent in counseling, explanation of diagnosis, planning of further management, and coordination of care.  MEDICATIONS: Scheduled Meds: . aspirin EC  81 mg Oral Daily  . clopidogrel  75 mg Oral Daily  . enoxaparin (LOVENOX) injection  40 mg Subcutaneous Q24H  . sodium chloride flush  3 mL Intravenous Q12H   Continuous Infusions: . dextrose 75 mL/hr at 01/22/18 0720   PRN Meds:.acetaminophen **OR** acetaminophen, ondansetron **OR** ondansetron (ZOFRAN) IV   PHYSICAL EXAM: Vital signs: Vitals:   01/21/18 1730 01/21/18 1828 01/21/18 2100 01/22/18 0500  BP: (!) 116/95 119/71 116/78 (!) 133/107  Pulse: (!) 53 61 (!) 56 (!) 50  Resp: 17 18 17 17   Temp:  97.7 F (36.5 C) (!) 97.5 F (36.4 C) (!) 97.4 F (36.3 C)  TempSrc:  Oral Axillary Axillary  SpO2: 99% 100% 98% 100%  Weight:  71 kg (156 lb 8 oz)  71.1 kg (156 lb 12.8 oz)  Height:  5\' 6"  (1.676 m)     Filed Weights   01/21/18 1421 01/21/18 1828 01/22/18 0500  Weight: 79.4 kg (175 lb) 71 kg (156 lb 8 oz) 71.1 kg (156 lb 12.8 oz)   Body mass index is 25.31 kg/m.   General appearance :Awake,pleasantly confused, not in any distress.  Eyes:, pupils equally reactive to light and accomodation,no scleral icterus. HEENT: Atraumatic and Normocephalic Neck: supple, no JVD. No cervical lymphadenopathy.  Resp:Good air entry bilaterally, no added sounds  CVS: S1 S2 regular, no murmurs.  GI: Bowel sounds present, Non tender and not  distended with no gaurding, rigidity or rebound.No organomegaly Extremities: B/L Lower Ext shows no edema, both legs are warm to touch Neurology:  Appears to be non focal Musculoskeletal:No digital cyanosis Skin:No Rash, warm and dry Wounds:N/A  I have personally  reviewed following labs and imaging studies  LABORATORY DATA: CBC: Recent Labs  Lab 01/21/18 1510 01/22/18 0345  WBC 10.2 8.2  NEUTROABS 6.7  --   HGB 16.1* 14.7  HCT 52.8* 48.8*  MCV 95.0 96.1  PLT 189 138*    Basic Metabolic Panel: Recent Labs  Lab 01/21/18 1510 01/22/18 0345  NA 158* 157*  K 3.5 3.9  CL 124* 125*  CO2 23 23  GLUCOSE 136* 121*  BUN 42* 36*  CREATININE 1.53* 1.20*  CALCIUM 8.9 8.7*    GFR: Estimated Creatinine Clearance: 40.3 mL/min (A) (by C-G formula based on SCr of 1.2 mg/dL (H)).  Liver Function Tests: Recent Labs  Lab 01/21/18 1510 01/22/18 0345  AST 38 36  ALT 48 44  ALKPHOS 82 74  BILITOT 1.1 1.4*  PROT 6.7 6.0*  ALBUMIN 3.5 3.1*   No results for input(s): LIPASE, AMYLASE in the last 168 hours. Recent Labs  Lab 01/21/18 1510  AMMONIA 29    Coagulation Profile: No results for input(s): INR, PROTIME in the last 168 hours.  Cardiac Enzymes: No results for input(s): CKTOTAL, CKMB, CKMBINDEX, TROPONINI in the last 168 hours.  BNP (last 3 results) No results for input(s): PROBNP in the last 8760 hours.  HbA1C: No results for input(s): HGBA1C in the last 72 hours.  CBG: No results for input(s): GLUCAP in the last 168 hours.  Lipid Profile: No results for input(s): CHOL, HDL, LDLCALC, TRIG, CHOLHDL, LDLDIRECT in the last 72 hours.  Thyroid Function Tests: No results for input(s): TSH, T4TOTAL, FREET4, T3FREE, THYROIDAB in the last 72 hours.  Anemia Panel: No results for input(s): VITAMINB12, FOLATE, FERRITIN, TIBC, IRON, RETICCTPCT in the last 72 hours.  Urine analysis:    Component Value Date/Time   COLORURINE YELLOW 01/21/2018 1628   APPEARANCEUR CLOUDY (A) 01/21/2018 1628   LABSPEC 1.020 01/21/2018 1628   PHURINE 5.0 01/21/2018 1628   GLUCOSEU NEGATIVE 01/21/2018 1628   HGBUR NEGATIVE 01/21/2018 1628   BILIRUBINUR NEGATIVE 01/21/2018 1628   KETONESUR NEGATIVE 01/21/2018 1628   PROTEINUR NEGATIVE 01/21/2018  1628   UROBILINOGEN 0.2 12/12/2007 1020   NITRITE POSITIVE (A) 01/21/2018 1628   LEUKOCYTESUR NEGATIVE 01/21/2018 1628    Sepsis Labs: Lactic Acid, Venous    Component Value Date/Time   LATICACIDVEN 1.44 01/21/2018 1527    MICROBIOLOGY: No results found for this or any previous visit (from the past 240 hour(s)).  RADIOLOGY STUDIES/RESULTS: Ct Head Wo Contrast  Result Date: 01/21/2018 CLINICAL DATA:  72 year old female with altered level of consciousness. EXAM: CT HEAD WITHOUT CONTRAST TECHNIQUE: Contiguous axial images were obtained from the base of the skull through the vertex without intravenous contrast. COMPARISON:  12/30/2017 CT and prior studies FINDINGS: Brain: No evidence of acute infarction, hemorrhage, hydrocephalus, extra-axial collection or mass lesion/mass effect. Atrophy, chronic small-vessel white matter ischemic changes and remote thalamic, basal ganglia and cerebellar infarcts again noted. Vascular: Atherosclerotic calcifications noted Skull: No acute abnormality Sinuses/Orbits: No acute abnormality. Other: None. IMPRESSION: 1. No evidence of acute intracranial abnormality. 2. Atrophy, chronic small-vessel white matter ischemic changes and remote infarcts as described. Electronically Signed   By: Harmon Pier M.D.   On: 01/21/2018 16:20   Dg Chest Port 1 View  Result Date: 01/21/2018  CLINICAL DATA:  Observed by the RN and family to be more lethargic and failure to thrive over the last 4 days. Pt following all commands, has hx of dementia and is oriented to self. EXAM: PORTABLE CHEST 1 VIEW COMPARISON:  12/30/2017 FINDINGS: The heart size and mediastinal contours are within normal limits. Both lungs are clear. The visualized skeletal structures are unremarkable. IMPRESSION: No active disease. Electronically Signed   By: Elige Ko   On: 01/21/2018 15:05     LOS: 1 day   Jeoffrey Massed, MD  [facility] Pager:[phone]  If 7PM-7AM, please contact  night-coverage [email or web address] Password [email or web address] 01/22/2018, 11:13 AM

## 2018-01-22 NOTE — Evaluation (Signed)
Physical Therapy Evaluation Patient Details Name: Morgan Sutton MRN: [id number] DOB: [birth year] Today's Date: 01/22/2018   History of Present Illness  Pt is a 72 y.o. female with medical history consisting of peripheral vascular disease status post iliac stents, recent acute CVA February 2019, dementia with psychotic features, and hypertension.  Admitted from nursing facility with increased lethargy and failure to thrive over 4 days.  Found to have AKI and hypernatremia., acute metabolic encephalopathy.     Clinical Impression  Pt admitted with above diagnosis. Pt currently with functional limitations due to the deficits listed below (see PT Problem List). On eval, pt required max assist bed mobility. Encouragement needed for her to participate in mobility. She declined any progression beyond EOB. She would shake her head no, followed by attempts to return supine in bed. Pt will benefit from skilled PT to increase their independence and safety with mobility to allow discharge to the venue listed below.       Follow Up Recommendations SNF    Equipment Recommendations  None recommended by PT    Recommendations for Other Services       Precautions / Restrictions Precautions Precautions: Fall      Mobility  Bed Mobility Overal bed mobility: Needs Assistance Bed Mobility: Supine to Sit;Sit to Supine     Supine to sit: HOB elevated;Max assist Sit to supine: Max assist   General bed mobility comments: assist with BLE and to elevate trunk.  Transfers                 General transfer comment: Pt declining OOB.   Ambulation/Gait             General Gait Details: unable   Stairs            Wheelchair Mobility    Modified Rankin (Stroke Patients Only)       Balance Overall balance assessment: Needs assistance Sitting-balance support: Bilateral upper extremity supported;Feet supported Sitting balance-Leahy Scale: Fair Sitting balance - Comments: BUE  support and occassional min guard assist to maintain balance EOB x 4 minutes.                                     Pertinent Vitals/Pain Pain Assessment: No/denies pain Faces Pain Scale: Hurts a little bit Pain Intervention(s): Monitored during session    Home Living Family/patient expects to be discharged to:: Skilled nursing facility                      Prior Function Level of Independence: Needs assistance   Gait / Transfers Assistance Needed: Ambulated with RW prior to CVA in Feb 2019.     Comments: Prior to CVA in Feb 2019. Pt lived at home with her son. History provided by pt's sister as pt is a poor historian.      Hand Dominance   Dominant Hand: Right    Extremity/Trunk Assessment   Upper Extremity Assessment Upper Extremity Assessment: Defer to OT evaluation    Lower Extremity Assessment Lower Extremity Assessment: Generalized weakness(grossly 3/5, however, pt not fully participating.)    Cervical / Trunk Assessment Cervical / Trunk Assessment: Kyphotic  Communication   Communication: Expressive difficulties  Cognition Arousal/Alertness: Awake/alert Behavior During Therapy: Flat affect Overall Cognitive Status: History of cognitive impairments - at baseline  General Comments: Slow to respond. Answers simple questions appropriately. Oriented to self.       General Comments      Exercises     Assessment/Plan    PT Assessment Patient needs continued PT services  PT Problem List Decreased strength;Decreased mobility;Decreased activity tolerance;Decreased cognition;Decreased balance;Decreased knowledge of use of DME;Decreased safety awareness       PT Treatment Interventions DME instruction;Therapeutic activities;Therapeutic exercise;Gait training;Patient/family education;Balance training;Functional mobility training;Neuromuscular re-education    PT Goals (Current goals can be found  in the Care Plan section)  Acute Rehab PT Goals Patient Stated Goal: not stated PT Goal Formulation: With patient/family Time For Goal Achievement: 02/05/18 Potential to Achieve Goals: Good    Frequency Min 2X/week   Barriers to discharge        Co-evaluation               AM-PAC PT "6 Clicks" Daily Activity  Outcome Measure Difficulty turning over in bed (including adjusting bedclothes, sheets and blankets)?: A Lot Difficulty moving from lying on back to sitting on the side of the bed? : Unable Difficulty sitting down on and standing up from a chair with arms (e.g., wheelchair, bedside commode, etc,.)?: Unable Help needed moving to and from a bed to chair (including a wheelchair)?: Total Help needed walking in hospital room?: Total Help needed climbing 3-5 steps with a railing? : Total 6 Click Score: 7    End of Session   Activity Tolerance: Patient limited by fatigue Patient left: in bed;with call bell/phone within reach;with bed alarm set;with family/visitor present Nurse Communication: Mobility status PT Visit Diagnosis: Muscle weakness (generalized) (M62.81);Other abnormalities of gait and mobility (R26.89)    Time: 1610-9604 PT Time Calculation (min) (ACUTE ONLY): 24 min   Charges:   PT Evaluation $PT Eval Moderate Complexity: 1 Mod PT Treatments $Therapeutic Activity: 8-22 mins   PT G Codes:        Aida Raider, PT  Office # [phone] Pager [phone]   Ilda Foil 01/22/2018, 3:56 PM

## 2018-01-22 NOTE — Evaluation (Signed)
Clinical/Bedside Swallow Evaluation Patient Details  Name: Morgan Sutton MRN: [id number] Date of Birth: 1945-12-08  Today's Date: 01/22/2018 Time: SLP Start Time (ACUTE ONLY): 1334 SLP Stop Time (ACUTE ONLY): 1351 SLP Time Calculation (min) (ACUTE ONLY): 17 min  Past Medical History:  Past Medical History:  Diagnosis Date  . Cerebral infarction Feb 2019   . Dementia   . Hypertension   . Peripheral vascular disease (HCC)   . Stroke [facility])    Past Surgical History: History reviewed. No pertinent surgical history. HPI:  Morgan Sutton is a 72 y.o. female with medical history: peripheral vascular disease status post iliac stents, recent acute CVA February 2019, dementia with psychotic features, and hypertension.  Admitted from nursing facility with increased lethargy and failure to thrive over 4 days.  Found to have AKI and hypernatremia., acute metabolic encephalopathy. CT atrophy, chronic small-vessel white matter ischemic changes and remote thalamic, basal ganglia and cerebellar infarcts.   Assessment / Plan / Recommendation Clinical Impression  Family states alertness and awareness much improved from yesterday. Following commands (mild delayed processing), vocal quality clear, no overt oral-motor weakness. Mildly decreased oral manipulation leaving min-mild labial residue with intermittent awareness; able to clear oral cavity. No indication of aspiration during assessment. Encouraged pt/family to doff dentures prior to sleeping and let soak in cleaner. Recommend initiate D3, thin, pills with water and can upgrade to regular when cognition at baseline. No further ST needed.  SLP Visit Diagnosis: Dysphagia, oral phase (R13.11)    Aspiration Risk  Mild aspiration risk    Diet Recommendation Dysphagia 3 (Mech soft);Thin liquid   Liquid Administration via: Cup;Straw Medication Administration: Whole meds with liquid Supervision: Patient able to self feed;Full supervision/cueing for  compensatory strategies Compensations: Slow rate;Small sips/bites;Lingual sweep for clearance of pocketing Postural Changes: Seated upright at 90 degrees    Other  Recommendations Oral Care Recommendations: Oral care BID   Follow up Recommendations None      Frequency and Duration            Prognosis        Swallow Study   General HPI: Morgan Sutton is a 72 y.o. female with medical history: peripheral vascular disease status post iliac stents, recent acute CVA February 2019, dementia with psychotic features, and hypertension.  Admitted from nursing facility with increased lethargy and failure to thrive over 4 days.  Found to have AKI and hypernatremia., acute metabolic encephalopathy. CT atrophy, chronic small-vessel white matter ischemic changes and remote thalamic, basal ganglia and cerebellar infarcts. Type of Study: Bedside Swallow Evaluation Previous Swallow Assessment: (none) Diet Prior to this Study: NPO Temperature Spikes Noted: No Respiratory Status: Room air History of Recent Intubation: No Behavior/Cognition: Alert;Cooperative;Pleasant mood;Requires cueing Oral Cavity Assessment: Excessive secretions Oral Care Completed by SLP: Yes Oral Cavity - Dentition: Dentures, top(lower mostly natural) Vision: Functional for self-feeding Self-Feeding Abilities: Able to feed self;Needs set up Patient Positioning: Upright in bed Baseline Vocal Quality: Normal Volitional Cough: Strong Volitional Swallow: Able to elicit    Oral/Motor/Sensory Function Overall Oral Motor/Sensory Function: Within functional limits   Ice Chips Ice chips: Not tested   Thin Liquid Thin Liquid: Within functional limits Presentation: Cup;Straw    Nectar Thick Nectar Thick Liquid: Not tested   Honey Thick Honey Thick Liquid: Not tested   Puree Puree: Impaired Presentation: Spoon;Self Fed Oral Phase Impairments: Reduced labial seal Oral Phase Functional Implications: (min labial residue) Pharyngeal  Phase Impairments: (none)   Solid   GO  Solid: Impaired Oral Phase Impairments: Reduced lingual movement/coordination Oral Phase Functional Implications: (labial residue) Pharyngeal Phase Impairments: (none)        Roque CashLitaker, Breck CoonsLisa Willis 01/22/2018,2:06 PM  Breck CoonsLisa Willis Lonell FaceLitaker M.Ed [employer] Pager [phone]

## 2018-01-23 ENCOUNTER — Encounter (HOSPITAL_COMMUNITY): Payer: Self-pay | Admitting: *Deleted

## 2018-01-23 ENCOUNTER — Other Ambulatory Visit: Payer: Self-pay

## 2018-01-23 DIAGNOSIS — F0150 Vascular dementia without behavioral disturbance: Secondary | ICD-10-CM

## 2018-01-23 DIAGNOSIS — L8990 Pressure ulcer of unspecified site, unspecified stage: Secondary | ICD-10-CM

## 2018-01-23 LAB — BASIC METABOLIC PANEL
Anion gap: 12 (ref 5–15)
BUN: 18 mg/dL (ref 6–20)
CALCIUM: 8.6 mg/dL — AB (ref 8.9–10.3)
CHLORIDE: 112 mmol/L — AB (ref 101–111)
CO2: 22 mmol/L (ref 22–32)
CREATININE: 0.91 mg/dL (ref 0.44–1.00)
GFR calc non Af Amer: >60 mL/min (ref >60)
Glucose, Bld: 129 mg/dL — ABNORMAL HIGH (ref 65–99)
Potassium: 3 mmol/L — ABNORMAL LOW (ref 3.5–5.1)
Sodium: 146 mmol/L — ABNORMAL HIGH (ref 135–145)

## 2018-01-23 LAB — URINE CULTURE

## 2018-01-23 MED ORDER — POTASSIUM CHLORIDE CRYS ER 20 MEQ PO TBCR
40.0000 meq | EXTENDED_RELEASE_TABLET | Freq: Once | ORAL | Status: AC
  Administered 2018-01-23: 40 meq via ORAL
  Filled 2018-01-23: qty 2

## 2018-01-23 NOTE — Progress Notes (Signed)
PROGRESS NOTE        PATIENT DETAILS Name: Morgan Sutton Age: 72 y.o. Sex: female Date of Birth: May 01, 1946 Admit Date: 01/21/2018 Admitting Physician Dewayne Shorter Levora Dredge, MD PCP:O'Buch, Edgardo Roys, PA-C  Brief Narrative: Patient is a 72 y.o. female with history of recent acute CVA in February 2019, dementia, peripheral vascular disease status post bilateral PCI in 2017 transferred from skilled nursing facility for evaluation of altered mental status, found to have hypernatremia and acute kidney injury.  Subjective: Pleasantly confused-only follows some commands-per RN no major events overnight  Assessment/Plan: Acute metabolic encephalopathy: Due to hyponatremia and acute kidney injury.  Mental status has improved, suspect it is back to baseline.  CT head negative for acute abnormalities on admission.    Hypernatremia: Suspect secondary to poor oral intake-in the use of diuretics.  Continue D5W-await labs that were ordered stat this morning.   Acute kidney injury: Likely hemodynamically mediated due to poor oral intake, use of diuretics and losartan.  Renal function is improving with gentle hydration-await repeat labs this morning.   Recent CVA: No focal deficits-continue antiplatelet agents.    History of peripheral vascular disease status post PCI in 2017 bilateral iliac arteries: Legs appear warm to touch-continue antiplatelet agents.  Hypertension: Controlled-continue to hold diuretics, amlodipine and losartan.  CAD: No anginal symptoms-continue antiplatelets  Dementia: Suspect at baseline-mental status is back to baseline-following simple commands-but mostly pleasantly confused.  Resume Aricept  DVT Prophylaxis: Prophylactic Lovenox   Code Status: Full code   Family Communication: None at bedside-sister at bedside  Disposition Plan: Remain inpatient-SNF on discharge-in the next 1-2 days  Antimicrobial agents: Anti-infectives (From admission,  onward)   None     Procedures: None  CONSULTS:  None  Time spent: 25 minutes-Greater than 50% of this time was spent in counseling, explanation of diagnosis, planning of further management, and coordination of care.  MEDICATIONS: Scheduled Meds: . aspirin EC  81 mg Oral Daily  . clopidogrel  75 mg Oral Daily  . donepezil  10 mg Oral QHS  . enoxaparin (LOVENOX) injection  40 mg Subcutaneous Q24H  . sodium chloride flush  3 mL Intravenous Q12H   Continuous Infusions: . dextrose 75 mL/hr at 01/22/18 2114   PRN Meds:.acetaminophen **OR** acetaminophen, ondansetron **OR** ondansetron (ZOFRAN) IV   PHYSICAL EXAM: Vital signs: Vitals:   01/21/18 2100 01/22/18 0500 01/22/18 2147 01/23/18 0506  BP: 116/78 (!) 133/107 (!) 143/75 (!) 144/75  Pulse: (!) 56 (!) 50 (!) 48 (!) 55  Resp: 17 17 16 16   Temp: (!) 97.5 F (36.4 C) (!) 97.4 F (36.3 C) 97.7 F (36.5 C)   TempSrc: Axillary Axillary Oral Oral  SpO2: 98% 100%  97%  Weight:  71.1 kg (156 lb 12.8 oz)  73.8 kg (162 lb 11.2 oz)  Height:       Filed Weights   01/21/18 1828 01/22/18 0500 01/23/18 0506  Weight: 71 kg (156 lb 8 oz) 71.1 kg (156 lb 12.8 oz) 73.8 kg (162 lb 11.2 oz)   Body mass index is 26.26 kg/m.   General appearance :Awake, alert-pleasantly confused.  Not in any distress. Eyes:, pupils equally reactive to light and accomodation,no scleral icterus. HEENT: Atraumatic and Normocephalic Neck: supple, no JVD. Resp:Good air entry bilaterally, clear to auscultation anteriorly CVS: S1 S2 regular, no murmurs.  GI: Bowel sounds present, Non tender  and not distended with no gaurding, rigidity or rebound. Extremities: B/L Lower Ext shows no edema, both legs are warm to touch Neurology: Difficult exam-but moving all 4 extremities Musculoskeletal:No digital cyanosis Skin:No Rash, warm and dry Wounds:N/A  I have personally reviewed following labs and imaging studies  LABORATORY DATA: CBC: Recent Labs  Lab  01/21/18 1510 01/22/18 0345  WBC 10.2 8.2  NEUTROABS 6.7  --   HGB 16.1* 14.7  HCT 52.8* 48.8*  MCV 95.0 96.1  PLT 189 138*    Basic Metabolic Panel: Recent Labs  Lab 01/21/18 1510 01/22/18 0345  NA 158* 157*  K 3.5 3.9  CL 124* 125*  CO2 23 23  GLUCOSE 136* 121*  BUN 42* 36*  CREATININE 1.53* 1.20*  CALCIUM 8.9 8.7*    GFR: Estimated Creatinine Clearance: 44.2 mL/min (A) (by C-G formula based on SCr of 1.2 mg/dL (H)).  Liver Function Tests: Recent Labs  Lab 01/21/18 1510 01/22/18 0345  AST 38 36  ALT 48 44  ALKPHOS 82 74  BILITOT 1.1 1.4*  PROT 6.7 6.0*  ALBUMIN 3.5 3.1*   No results for input(s): LIPASE, AMYLASE in the last 168 hours. Recent Labs  Lab 01/21/18 1510  AMMONIA 29    Coagulation Profile: No results for input(s): INR, PROTIME in the last 168 hours.  Cardiac Enzymes: No results for input(s): CKTOTAL, CKMB, CKMBINDEX, TROPONINI in the last 168 hours.  BNP (last 3 results) No results for input(s): PROBNP in the last 8760 hours.  HbA1C: No results for input(s): HGBA1C in the last 72 hours.  CBG: No results for input(s): GLUCAP in the last 168 hours.  Lipid Profile: No results for input(s): CHOL, HDL, LDLCALC, TRIG, CHOLHDL, LDLDIRECT in the last 72 hours.  Thyroid Function Tests: No results for input(s): TSH, T4TOTAL, FREET4, T3FREE, THYROIDAB in the last 72 hours.  Anemia Panel: No results for input(s): VITAMINB12, FOLATE, FERRITIN, TIBC, IRON, RETICCTPCT in the last 72 hours.  Urine analysis:    Component Value Date/Time   COLORURINE YELLOW 01/21/2018 1628   APPEARANCEUR CLOUDY (A) 01/21/2018 1628   LABSPEC 1.020 01/21/2018 1628   PHURINE 5.0 01/21/2018 1628   GLUCOSEU NEGATIVE 01/21/2018 1628   HGBUR NEGATIVE 01/21/2018 1628   BILIRUBINUR NEGATIVE 01/21/2018 1628   KETONESUR NEGATIVE 01/21/2018 1628   PROTEINUR NEGATIVE 01/21/2018 1628   UROBILINOGEN 0.2 12/12/2007 1020   NITRITE POSITIVE (A) 01/21/2018 1628    LEUKOCYTESUR NEGATIVE 01/21/2018 1628    Sepsis Labs: Lactic Acid, Venous    Component Value Date/Time   LATICACIDVEN 1.44 01/21/2018 1527    MICROBIOLOGY: Recent Results (from the past 240 hour(s))  Urine culture     Status: Abnormal   Collection Time: 01/21/18  4:28 PM  Result Value Ref Range Status   Specimen Description URINE, CATHETERIZED  Final   Special Requests   Final    NONE Performed at [facility], [address] [place], [place] [address]    Culture 60,000 COLONIES/mL ESCHERICHIA COLI (A)  Final   Report Status 01/23/2018 FINAL  Final   Organism ID, Bacteria ESCHERICHIA COLI (A)  Final      Susceptibility   Escherichia coli - MIC*    AMPICILLIN <=2 SENSITIVE Sensitive     CEFAZOLIN <=4 SENSITIVE Sensitive     CEFTRIAXONE <=1 SENSITIVE Sensitive     CIPROFLOXACIN <=0.25 SENSITIVE Sensitive     GENTAMICIN <=1 SENSITIVE Sensitive     IMIPENEM <=0.25 SENSITIVE Sensitive     NITROFURANTOIN <=16  SENSITIVE Sensitive     TRIMETH/SULFA <=20 SENSITIVE Sensitive     AMPICILLIN/SULBACTAM <=2 SENSITIVE Sensitive     PIP/TAZO <=4 SENSITIVE Sensitive     Extended ESBL NEGATIVE Sensitive     * 60,000 COLONIES/mL ESCHERICHIA COLI  MRSA PCR Screening     Status: None   Collection Time: 01/22/18  4:34 PM  Result Value Ref Range Status   MRSA by PCR NEGATIVE NEGATIVE Final    Comment:        The GeneXpert MRSA Assay (FDA approved for NASAL specimens only), is one component of a comprehensive MRSA colonization surveillance program. It is not intended to diagnose MRSA infection nor to guide or monitor treatment for MRSA infections. Performed at [facility], [address] [place], [place] [address]     RADIOLOGY STUDIES/RESULTS: Ct Head Wo Contrast  Result Date: 01/21/2018 CLINICAL DATA:  72 year old female with altered level of consciousness. EXAM: CT HEAD WITHOUT CONTRAST TECHNIQUE: Contiguous axial images were obtained from the base of the skull  through the vertex without intravenous contrast. COMPARISON:  12/30/2017 CT and prior studies FINDINGS: Brain: No evidence of acute infarction, hemorrhage, hydrocephalus, extra-axial collection or mass lesion/mass effect. Atrophy, chronic small-vessel white matter ischemic changes and remote thalamic, basal ganglia and cerebellar infarcts again noted. Vascular: Atherosclerotic calcifications noted Skull: No acute abnormality Sinuses/Orbits: No acute abnormality. Other: None. IMPRESSION: 1. No evidence of acute intracranial abnormality. 2. Atrophy, chronic small-vessel white matter ischemic changes and remote infarcts as described. Electronically Signed   By: Harmon PierJeffrey  Hu M.D.   On: 01/21/2018 16:20   Dg Chest Port 1 View  Result Date: 01/21/2018 CLINICAL DATA:  Observed by the RN and family to be more lethargic and failure to thrive over the last 4 days. Pt following all commands, has hx of dementia and is oriented to self. EXAM: PORTABLE CHEST 1 VIEW COMPARISON:  12/30/2017 FINDINGS: The heart size and mediastinal contours are within normal limits. Both lungs are clear. The visualized skeletal structures are unremarkable. IMPRESSION: No active disease. Electronically Signed   By: Elige KoHetal  Patel   On: 01/21/2018 15:05     LOS: 2 days   Jeoffrey MassedShanker Ghimire, MD  [facility] Pager:[phone]  If 7PM-7AM, please contact night-coverage [email or web address] Password [email or web address] 01/23/2018, 11:27 AM

## 2018-01-23 NOTE — Evaluation (Signed)
Occupational Therapy Evaluation Patient Details Name: Morgan Sutton MRN: [id number] DOB: 25-Oct-1946 Today's Date: 01/23/2018    History of Present Illness Pt is a 72 y.o. female with medical history consisting of peripheral vascular disease status post iliac stents, recent acute CVA February 2019, dementia with psychotic features, and hypertension.  Admitted from nursing facility with increased lethargy and failure to thrive over 4 days.  Found to have AKI and hypernatremia., acute metabolic encephalopathy.    Clinical Impression   Pt admitted with the above diagnoses and presents with below problem list. Pt will benefit from continued acute OT to address the below listed deficits and maximize independence with basic ADLs prior to d/c to venue below. PTA pt was mod I with functional mobility (rw), unclear what PLOF with bathing/dressing was, pt is unable to provide history. Pt completed bed mobility with max A and sat EOB about 1 minute with min - max A. Pt indicating fatigue and desire to return to supine. Standing not attempted this session due to poor sitting balance. Currently max A with UB ADLs, max +2 - total A with LB ADLs.      Follow Up Recommendations  SNF    Equipment Recommendations  Other (comment)(defer to next venue)    Recommendations for Other Services       Precautions / Restrictions Precautions Precautions: Fall      Mobility Bed Mobility Overal bed mobility: Needs Assistance Bed Mobility: Supine to Sit;Sit to Supine     Supine to sit: HOB elevated;Max assist Sit to supine: Max assist   General bed mobility comments: assist with BLE and to elevate trunk.  Transfers                 General transfer comment: Pt declining OOB. Poor sitting balance.     Balance Overall balance assessment: Needs assistance Sitting-balance support: Bilateral upper extremity supported;Feet supported Sitting balance-Leahy Scale: Poor Sitting balance - Comments: Pt with R  hand on bed rail and left hand on mattress. Unable to transition R hand  to mattress for better BUE support position. Pt with increasing anterior R lateral lean while sitting EOB. Min - Max A to sit EOB about 1 minute. Postural control: Right lateral lean;Other (comment)(increasingly anterior lean sitting EOB)                                 ADL either performed or assessed with clinical judgement   ADL Overall ADL's : Needs assistance/impaired Eating/Feeding: Minimal assistance;Bed level   Grooming: Maximal assistance;Bed level   Upper Body Bathing: Total assistance;Sitting   Lower Body Bathing: Total assistance;Bed level   Upper Body Dressing : Maximal assistance;Sitting   Lower Body Dressing: Total assistance;Bed level                 General ADL Comments: Pt completed bed mobility and sat EOB with max A less than 1 minute. Pt indicating fatigue and making movements to return to supine. Max A for bed mobility.      Vision         Perception     Praxis      Pertinent Vitals/Pain Pain Assessment: Faces Faces Pain Scale: Hurts a little bit Pain Intervention(s): Monitored during session     Hand Dominance Right   Extremity/Trunk Assessment Upper Extremity Assessment Upper Extremity Assessment: Generalized weakness;Difficult to assess due to impaired cognition   Lower Extremity Assessment Lower Extremity  Assessment: Defer to PT evaluation   Cervical / Trunk Assessment Cervical / Trunk Assessment: Kyphotic   Communication Communication Communication: Expressive difficulties   Cognition Arousal/Alertness: Awake/alert Behavior During Therapy: Flat affect Overall Cognitive Status: History of cognitive impairments - at baseline                                 General Comments: Slow to respond. Answers simple questions appropriately. Short responses. Oriented to self. Pleasant, confused.    General Comments       Exercises      Shoulder Instructions      Home Living Family/patient expects to be discharged to:: Skilled nursing facility                                        Prior Functioning/Environment Level of Independence: Needs assistance  Gait / Transfers Assistance Needed: Ambulated with RW prior to CVA in Feb 2019.     Comments: Prior to CVA in Feb 2019. Pt lived at home with her son. History provided by pt's sister as pt is a poor historian.         OT Problem List: Decreased activity tolerance;Impaired balance (sitting and/or standing);Decreased cognition;Decreased safety awareness;Decreased knowledge of use of DME or AE;Decreased knowledge of precautions      OT Treatment/Interventions: Self-care/ADL training;DME and/or AE instruction;Therapeutic activities;Patient/family education;Balance training    OT Goals(Current goals can be found in the care plan section) Acute Rehab OT Goals Patient Stated Goal: not stated OT Goal Formulation: Patient unable to participate in goal setting Time For Goal Achievement: 02/06/18 Potential to Achieve Goals: Good ADL Goals Pt Will Perform Grooming: with mod assist;sitting Pt Will Perform Upper Body Bathing: with mod assist;sitting Pt Will Perform Lower Body Bathing: with mod assist;sit to/from stand Additional ADL Goal #1: Pt will sit EOB with BUE and min A for 2 minutes as precursor to ADLs in seated position.  OT Frequency: Min 2X/week   Barriers to D/C:            Co-evaluation              AM-PAC PT "6 Clicks" Daily Activity     Outcome Measure Help from another person eating meals?: A Little Help from another person taking care of personal grooming?: A Lot Help from another person toileting, which includes using toliet, bedpan, or urinal?: Total Help from another person bathing (including washing, rinsing, drying)?: Total Help from another person to put on and taking off regular upper body clothing?: A Lot Help from  another person to put on and taking off regular lower body clothing?: Total 6 Click Score: 10   End of Session    Activity Tolerance: Patient limited by fatigue Patient left: in bed;with call bell/phone within reach;with bed alarm set  OT Visit Diagnosis: Muscle weakness (generalized) (M62.81);Unsteadiness on feet (R26.81);Other symptoms and signs involving cognitive function                Time: 1047-1109 OT Time Calculation (min): 22 min Charges:  OT General Charges $OT Visit: 1 Visit OT Evaluation $OT Eval Moderate Complexity: 1 Mod G-Codes:       Pilar GrammesMathews, Jordi Lacko H 01/23/2018, 11:54 AM

## 2018-01-24 DIAGNOSIS — N179 Acute kidney failure, unspecified: Principal | ICD-10-CM

## 2018-01-24 LAB — BASIC METABOLIC PANEL
ANION GAP: 10 (ref 5–15)
BUN: 16 mg/dL (ref 6–20)
CALCIUM: 8.7 mg/dL — AB (ref 8.9–10.3)
CO2: 23 mmol/L (ref 22–32)
Chloride: 114 mmol/L — ABNORMAL HIGH (ref 101–111)
Creatinine, Ser: 1.08 mg/dL — ABNORMAL HIGH (ref 0.44–1.00)
GFR calc Af Amer: 58 mL/min — ABNORMAL LOW (ref >60)
GFR, EST NON AFRICAN AMERICAN: 50 mL/min — AB (ref >60)
Glucose, Bld: 132 mg/dL — ABNORMAL HIGH (ref 65–99)
POTASSIUM: 3.1 mmol/L — AB (ref 3.5–5.1)
Sodium: 147 mmol/L — ABNORMAL HIGH (ref 135–145)

## 2018-01-24 MED ORDER — DEXTROSE 5 % IV SOLN: INTRAVENOUS | Status: DC

## 2018-01-24 MED ORDER — MAGNESIUM SULFATE IN D5W 1-5 GM/100ML-% IV SOLN
1.0000 g | Freq: Once | INTRAVENOUS | Status: AC
  Administered 2018-01-24: 1 g via INTRAVENOUS
  Filled 2018-01-24: qty 100

## 2018-01-24 MED ORDER — ENSURE ENLIVE PO LIQD
237.0000 mL | Freq: Two times a day (BID) | ORAL | Status: DC
  Administered 2018-01-24 – 2018-01-25 (×2): 237 mL via ORAL

## 2018-01-24 MED ORDER — POTASSIUM CHLORIDE CRYS ER 20 MEQ PO TBCR
40.0000 meq | EXTENDED_RELEASE_TABLET | Freq: Four times a day (QID) | ORAL | Status: AC
  Administered 2018-01-24 (×2): 40 meq via ORAL
  Filled 2018-01-24 (×2): qty 2

## 2018-01-24 MED ORDER — DEXTROSE 5 % IV SOLN
INTRAVENOUS | Status: DC
  Administered 2018-01-24 – 2018-01-25 (×3): via INTRAVENOUS

## 2018-01-24 MED ORDER — ADULT MULTIVITAMIN W/MINERALS CH
1.0000 | ORAL_TABLET | Freq: Every day | ORAL | Status: DC
  Administered 2018-01-24 – 2018-01-25 (×2): 1 via ORAL
  Filled 2018-01-24 (×2): qty 1

## 2018-01-24 NOTE — Progress Notes (Signed)
   01/24/18 1000  Clinical Encounter Type  Visited With Patient  Visit Type Initial  Referral From Nurse  Consult/Referral To Chaplain  Spiritual Encounters  Spiritual Needs Prayer;Emotional  Stress Factors  Patient Stress Factors Family relationships   Followed up on a SCC for questions about a HCPA.  Patient was in the bed and alone.  She did not really understand and said no questions.  I sat for a bit and the patient seemed really worried about something.  She began to unpack that she was angry.  She did not really want to share exactly what that meant.  I noticed a flower in the room and asked about it, she said it is complicated.  I went and looked at the card and it was from [place].  Patient expressed concern and worry and is scared about her care for her.  When I let her know that I heard her she seemed relieved.  I have passed the information on to the CSW.  Will follow and support as needed.   Chaplain Agustin CreeNewton Reily Ilic

## 2018-01-24 NOTE — [place] FL2 (Signed)
[place] MEDICAID FL2 LEVEL OF CARE SCREENING TOOL     IDENTIFICATION  Patient Name: Morgan Sutton Birthdate: [birth year] Sex: female Admission Date (Current Location): 01/21/2018  [facility] and [place] Number:  Producer, television/film/video and Address:  The . [facility], [address] [place], [place] [address]      Provider Number: [id number]  Attending Physician Name and Address:  Morgan Sea, MD  Relative Name and Phone Number:       Current Level of Care: Hospital Recommended Level of Care: Skilled Nursing Facility Prior Approval Number:    Date Approved/Denied:   PASRR Number: [id number]  Discharge Plan: SNF    Current Diagnoses: Patient Active Problem List   Diagnosis Date Noted  . Pressure injury of skin 01/23/2018  . Acute kidney injury (HCC) 01/21/2018  . Acute hypernatremia 01/21/2018  . History of CVA (cerebrovascular accident) Feb 2019 01/21/2018  . Hypertension 01/21/2018  . Dementia 01/21/2018  . PAD (peripheral artery disease) (HCC) 01/21/2018  . Acute metabolic encephalopathy 01/21/2018    Orientation RESPIRATION BLADDER Height & Weight     Self  Normal Incontinent, External catheter Weight: 72.8 kg (160 lb 7.9 oz) Height:  5\' 6"  (167.6 cm)  BEHAVIORAL SYMPTOMS/MOOD NEUROLOGICAL BOWEL NUTRITION STATUS      Incontinent Diet(Please see DC Summary)  AMBULATORY STATUS COMMUNICATION OF NEEDS Skin   Extensive Assist Verbally PU Stage and Appropriate Care(Stage III on sacrum)                       Personal Care Assistance Level of Assistance  Bathing, Feeding, Dressing Bathing Assistance: Limited assistance Feeding assistance: Limited assistance Dressing Assistance: Limited assistance     Functional Limitations Info             SPECIAL CARE FACTORS FREQUENCY  PT (By licensed PT)     PT Frequency: 5x/week              Contractures      Additional Factors Info  Code Status, Allergies Code Status Info:  Full Allergies Info: NKA           Current Medications (01/24/2018):  This is the current hospital active medication list Current Facility-Administered Medications  Medication Dose Route Frequency Provider Last Rate Last Dose  . acetaminophen (TYLENOL) tablet 650 mg  650 mg Oral Q6H PRN Morgan Dar, NP       Or  . acetaminophen (TYLENOL) suppository 650 mg  650 mg Rectal Q6H PRN Morgan Dar, NP      . aspirin EC tablet 81 mg  81 mg Oral Daily Morgan Bees, MD   81 mg at 01/24/18 5621  . clopidogrel (PLAVIX) tablet 75 mg  75 mg Oral Daily Morgan Bees, MD   75 mg at 01/24/18 0824  . donepezil (ARICEPT) tablet 10 mg  10 mg Oral QHS Morgan Bees, MD   10 mg at 01/23/18 2135  . enoxaparin (LOVENOX) injection 40 mg  40 mg Subcutaneous Q24H Morgan Dar, NP   40 mg at 01/23/18 2018  . ondansetron (ZOFRAN) tablet 4 mg  4 mg Oral Q6H PRN Morgan Dar, NP       Or  . ondansetron [facility]) injection 4 mg  4 mg Intravenous Q6H PRN Morgan Dar, NP      . potassium chloride SA (K-DUR,KLOR-CON) CR tablet 40 mEq  40 mEq Oral Q6H Morgan Sea, MD  40 mEq at 01/24/18 0823  . sodium chloride flush (NS) 0.9 % injection 3 mL  3 mL Intravenous Q12H Morgan DarEllis, Morgan L, NP   3 mL at 01/24/18 [id number]     Discharge Medications: Please see discharge summary for a list of discharge medications.  Relevant Imaging Results:  Relevant Lab Results:   Additional Information SSN: [id number]  Morgan Lucien S BediasRayyan, [place]

## 2018-01-24 NOTE — Consult Note (Addendum)
WOC Nurse wound consult note Reason for Consult: Consult requested for sacrum. Wound type: Chronic healing stage 3 pressure injury; there are 2 locations: Left .3X.3X.1cm and right 2X.8X.1cm; both are 90% pink and moist, 10% yellow.  Small amt tan drainage. No odor Pressure Injury POA: Yes Dressing procedure/placement/frequency: Pt is frequently incontinent of urine and Purewick is in place to attempt to contain urine.  Foam dressing to protect and promote healing. No family present to discuss plan of care and patient does not appear to understand. Please re-consult if further assistance is needed.  Thank-you,  Cammie Mcgeeawn Uriah Philipson MSN, RN, CWOCN, [place]-AP, CNS [phone]

## 2018-01-24 NOTE — Progress Notes (Signed)
PROGRESS NOTE        PATIENT DETAILS Name: Morgan Sutton Age: 72 y.o. Sex: female Date of Birth: 1945-12-15 Admit Date: 01/21/2018 Admitting Physician Dewayne ShorterShanker Levora DredgeM Ghimire, MD PCP:O'Buch, Edgardo RoysGreta, PA-C  Brief Narrative: Patient is a 72 y.o. female with history of recent acute CVA in February 2019, dementia, peripheral vascular disease status post bilateral PCI in 2017 transferred from skilled nursing facility for evaluation of altered mental status, found to have hypernatremia and acute kidney injury.  Subjective:  Patient in bed, appears comfortable, denies any headache, no fever, no chest pain or pressure, no shortness of breath , no abdominal pain. No focal weakness.  Assessment/Plan:  Acute metabolic encephalopathy: Due to severe dehydration with hypernatremia and ARF -definite gradual but positive improvement, continue D5W for another 20 hours, PT and placement.  [facility]  ARF much better and almost resolved.  Hypo-kalemia.  Replace and monitor.  Recent CVA: No acute change, head CT unremarkable, no focal deficits, continue dual antiplatelet therapy for secondary prevention.  History of peripheral vascular disease status post PCI in 2017 bilateral iliac arteries: Legs appear warm to touch-continue antiplatelet agents.  Hypertension: Controlled-continue to hold diuretics, amlodipine and losartan.  CAD: No anginal symptoms-continue antiplatelets  Dementia: Suspect at baseline-mental status is back to baseline-following simple commands-but mostly pleasantly confused.  Resumed Aricept.   DVT Prophylaxis: Prophylactic Lovenox   Code Status: Full code   Family Communication: None at bedside-sister at bedside  Disposition Plan: Remain inpatient-SNF on discharge-in the next 1-2 days  Antimicrobial agents: Anti-infectives (From admission, onward)   None     Procedures: None  CONSULTS:  None  Time spent: 25  minutes-Greater than 50% of this time was spent in counseling, explanation of diagnosis, planning of further management, and coordination of care.  MEDICATIONS: Scheduled Meds: . aspirin EC  81 mg Oral Daily  . clopidogrel  75 mg Oral Daily  . donepezil  10 mg Oral QHS  . enoxaparin (LOVENOX) injection  40 mg Subcutaneous Q24H  . potassium chloride  40 mEq Oral Q6H  . sodium chloride flush  3 mL Intravenous Q12H   Continuous Infusions: . dextrose     PRN Meds:.acetaminophen **OR** acetaminophen, ondansetron **OR** ondansetron (ZOFRAN) IV   PHYSICAL EXAM: Vital signs: Vitals:   01/22/18 2147 01/23/18 0506 01/23/18 2133 01/24/18 0558  BP: (!) 143/75 (!) 144/75 131/78 (!) 153/94  Pulse: (!) 48 (!) 55 63 (!) 59  Resp: 16 16 17 15   Temp: 97.7 F (36.5 C)  98.1 F (36.7 C) 97.9 F (36.6 C)  TempSrc: Oral Oral Axillary Oral  SpO2:  97% 96% 99%  Weight:  73.8 kg (162 lb 11.2 oz)  72.8 kg (160 lb 7.9 oz)  Height:       Filed Weights   01/22/18 0500 01/23/18 0506 01/24/18 0558  Weight: 71.1 kg (156 lb 12.8 oz) 73.8 kg (162 lb 11.2 oz) 72.8 kg (160 lb 7.9 oz)   Body mass index is 25.9 kg/m.   Physical Exam  Awake Alert, but overall appears quite weak and frail, slow to answer questions but will follow commands, oriented X 3, No new F.N deficits,   [place].AT,PERRAL Supple Neck,No JVD, No cervical lymphadenopathy appriciated.  Symmetrical Chest wall movement, Good air movement bilaterally, CTAB RRR,No Gallops, Rubs or new Murmurs, No  Parasternal Heave +ve B.Sounds, Abd Soft, No tenderness, No organomegaly appriciated, No rebound - guarding or rigidity. No Cyanosis, Clubbing or edema, No new Rash or bruise    I have personally reviewed following labs and imaging studies  LABORATORY DATA: CBC: Recent Labs  Lab 01/21/18 1510 01/22/18 0345  WBC 10.2 8.2  NEUTROABS 6.7  --   HGB 16.1* 14.7  HCT 52.8* 48.8*  MCV 95.0 96.1  PLT 189 138*    Basic Metabolic Panel: Recent  Labs  Lab 01/21/18 1510 01/22/18 0345 01/23/18 1041 01/24/18 0619  NA 158* 157* 146* 147*  K 3.5 3.9 3.0* 3.1*  CL 124* 125* 112* 114*  CO2 23 23 22 23   GLUCOSE 136* 121* 129* 132*  BUN 42* 36* 18 16  CREATININE 1.53* 1.20* 0.91 1.08*  CALCIUM 8.9 8.7* 8.6* 8.7*    GFR: Estimated Creatinine Clearance: 48.8 mL/min (A) (by C-G formula based on SCr of 1.08 mg/dL (H)).  Liver Function Tests: Recent Labs  Lab 01/21/18 1510 01/22/18 0345  AST 38 36  ALT 48 44  ALKPHOS 82 74  BILITOT 1.1 1.4*  PROT 6.7 6.0*  ALBUMIN 3.5 3.1*   No results for input(s): LIPASE, AMYLASE in the last 168 hours. Recent Labs  Lab 01/21/18 1510  AMMONIA 29    Coagulation Profile: No results for input(s): INR, PROTIME in the last 168 hours.  Cardiac Enzymes: No results for input(s): CKTOTAL, CKMB, CKMBINDEX, TROPONINI in the last 168 hours.  BNP (last 3 results) No results for input(s): PROBNP in the last 8760 hours.  HbA1C: No results for input(s): HGBA1C in the last 72 hours.  CBG: No results for input(s): GLUCAP in the last 168 hours.  Lipid Profile: No results for input(s): CHOL, HDL, LDLCALC, TRIG, CHOLHDL, LDLDIRECT in the last 72 hours.  Thyroid Function Tests: No results for input(s): TSH, T4TOTAL, FREET4, T3FREE, THYROIDAB in the last 72 hours.  Anemia Panel: No results for input(s): VITAMINB12, FOLATE, FERRITIN, TIBC, IRON, RETICCTPCT in the last 72 hours.  Urine analysis:    Component Value Date/Time   COLORURINE YELLOW 01/21/2018 1628   APPEARANCEUR CLOUDY (A) 01/21/2018 1628   LABSPEC 1.020 01/21/2018 1628   PHURINE 5.0 01/21/2018 1628   GLUCOSEU NEGATIVE 01/21/2018 1628   HGBUR NEGATIVE 01/21/2018 1628   BILIRUBINUR NEGATIVE 01/21/2018 1628   KETONESUR NEGATIVE 01/21/2018 1628   PROTEINUR NEGATIVE 01/21/2018 1628   UROBILINOGEN 0.2 12/12/2007 1020   NITRITE POSITIVE (A) 01/21/2018 1628   LEUKOCYTESUR NEGATIVE 01/21/2018 1628    Sepsis Labs: Lactic Acid,  Venous    Component Value Date/Time   LATICACIDVEN 1.44 01/21/2018 1527    MICROBIOLOGY: Recent Results (from the past 240 hour(s))  Urine culture     Status: Abnormal   Collection Time: 01/21/18  4:28 PM  Result Value Ref Range Status   Specimen Description URINE, CATHETERIZED  Final   Special Requests   Final    NONE Performed at [facility], [address], [place], [place] [address]    Culture 60,000 COLONIES/mL ESCHERICHIA COLI (A)  Final   Report Status 01/23/2018 FINAL  Final   Organism ID, Bacteria ESCHERICHIA COLI (A)  Final      Susceptibility   Escherichia coli - MIC*    AMPICILLIN <=2 SENSITIVE Sensitive     CEFAZOLIN <=4 SENSITIVE Sensitive     CEFTRIAXONE <=1 SENSITIVE Sensitive     CIPROFLOXACIN <=0.25 SENSITIVE Sensitive     GENTAMICIN <=1 SENSITIVE Sensitive  IMIPENEM <=0.25 SENSITIVE Sensitive     NITROFURANTOIN <=16 SENSITIVE Sensitive     TRIMETH/SULFA <=20 SENSITIVE Sensitive     AMPICILLIN/SULBACTAM <=2 SENSITIVE Sensitive     PIP/TAZO <=4 SENSITIVE Sensitive     Extended ESBL NEGATIVE Sensitive     * 60,000 COLONIES/mL ESCHERICHIA COLI  MRSA PCR Screening     Status: None   Collection Time: 01/22/18  4:34 PM  Result Value Ref Range Status   MRSA by PCR NEGATIVE NEGATIVE Final    Comment:        The GeneXpert MRSA Assay (FDA approved for NASAL specimens only), is one component of a comprehensive MRSA colonization surveillance program. It is not intended to diagnose MRSA infection nor to guide or monitor treatment for MRSA infections. Performed at [facility], [address] [place], [place] [address]     RADIOLOGY STUDIES/RESULTS: Ct Head Wo Contrast  Result Date: 01/21/2018 CLINICAL DATA:  72 year old female with altered level of consciousness. EXAM: CT HEAD WITHOUT CONTRAST TECHNIQUE: Contiguous axial images were obtained from the base of the skull through the vertex without intravenous contrast. COMPARISON:  12/30/2017 CT  and prior studies FINDINGS: Brain: No evidence of acute infarction, hemorrhage, hydrocephalus, extra-axial collection or mass lesion/mass effect. Atrophy, chronic small-vessel white matter ischemic changes and remote thalamic, basal ganglia and cerebellar infarcts again noted. Vascular: Atherosclerotic calcifications noted Skull: No acute abnormality Sinuses/Orbits: No acute abnormality. Other: None. IMPRESSION: 1. No evidence of acute intracranial abnormality. 2. Atrophy, chronic small-vessel white matter ischemic changes and remote infarcts as described. Electronically Signed   By: Harmon Pier M.D.   On: 01/21/2018 16:20   Dg Chest Port 1 View  Result Date: 01/21/2018 CLINICAL DATA:  Observed by the RN and family to be more lethargic and failure to thrive over the last 4 days. Pt following all commands, has hx of dementia and is oriented to self. EXAM: PORTABLE CHEST 1 VIEW COMPARISON:  12/30/2017 FINDINGS: The heart size and mediastinal contours are within normal limits. Both lungs are clear. The visualized skeletal structures are unremarkable. IMPRESSION: No active disease. Electronically Signed   By: Elige Ko   On: 01/21/2018 15:05     LOS: 3 days   Signature  Susa Raring M.D on 01/24/2018 at 12:27 PM  Between 7am to 7pm - Pager - [phone] ( page via [email or web address], text pages only, please mention full 10 digit call back number).  After 7pm go to [email or web address] - password [facility]

## 2018-01-24 NOTE — Progress Notes (Signed)
Initial Nutrition Assessment  DOCUMENTATION CODES:   Not applicable  INTERVENTION:  1. Ensure Enlive po BID, each supplement provides 350 kcal and 20 grams of protein  2. MVI w/ Minerals  3. Replete K+ (3.1)  NUTRITION DIAGNOSIS:   Increased nutrient needs related to acute illness as evidenced by estimated needs.  GOAL:   Patient will meet greater than or equal to 90% of their needs  MONITOR:   PO intake, I & O's, Labs, Weight trends, Supplement acceptance  REASON FOR ASSESSMENT:   Malnutrition Screening Tool    ASSESSMENT:   Morgan Sutton is a 72 y.o. female with a Past Medical History of recent CVA, dementia, remote history of CAD, PCI with bilateral iliac stents in 2017 who presents from her skilled nursing facility for evaluation of altered mental status. Has improved since admit. Continues to be hypernatremic, suffering from AKI due to poor oral intake.  Attempted to speak with patient at bedside but she is unable to provide history. Per chart review, PO intake 40-60% so far today. NFPE WNL, unsure of mobility, if patient was ambulatory but looks ok regardless. Noted Stg III to sacrum.  Labs reviewed:  Na 147, K+ 3.1 Medications reviewed and include:  D5 @ 16010mL/hr --> 449 calories  NUTRITION - FOCUSED PHYSICAL EXAM:    Most Recent Value  Orbital Region  No depletion  Upper Arm Region  No depletion  Thoracic and Lumbar Region  No depletion  Buccal Region  No depletion  Temple Region  No depletion  Clavicle Bone Region  No depletion  Clavicle and Acromion Bone Region  No depletion  Scapular Bone Region  No depletion  Dorsal Hand  No depletion  Patellar Region  No depletion  Anterior Thigh Region  No depletion  Posterior Calf Region  Mild depletion  Edema (RD Assessment)  None  Hair  Reviewed  Eyes  Reviewed  Mouth  Reviewed  Skin  Reviewed  Nails  Reviewed      Diet Order:  DIET DYS 3 Room service appropriate? Yes; Fluid consistency:  Thin  EDUCATION NEEDS:   Not appropriate for education at this time  Skin:  Skin Assessment: Skin Integrity Issues: Skin Integrity Issues:: Stage III Stage III: to sacrum  Last BM:  01/23/2018  Height:   Ht Readings from Last 1 Encounters:  01/21/18 5\' 6"  (1.676 m)    Weight:   Wt Readings from Last 1 Encounters:  01/24/18 160 lb 7.9 oz (72.8 kg)    Ideal Body Weight:  59.09 kg  BMI:  Body mass index is 25.9 kg/m.  Estimated Nutritional Needs:   Kcal:  1643-1770 calories (MSJ x1.3-1.4)  Protein:  100-116 grams  Fluid:  1.7-1.8L   Dionne AnoWilliam M. Erol Flanagin, MS, RD LDN Inpatient Clinical Dietitian Pager [phone]

## 2018-01-25 DIAGNOSIS — L89323 Pressure ulcer of left buttock, stage 3: Secondary | ICD-10-CM | POA: Diagnosis not present

## 2018-01-25 DIAGNOSIS — E538 Deficiency of other specified B group vitamins: Secondary | ICD-10-CM | POA: Diagnosis not present

## 2018-01-25 DIAGNOSIS — F4320 Adjustment disorder, unspecified: Secondary | ICD-10-CM | POA: Diagnosis not present

## 2018-01-25 DIAGNOSIS — R638 Other symptoms and signs concerning food and fluid intake: Secondary | ICD-10-CM | POA: Diagnosis not present

## 2018-01-25 DIAGNOSIS — Z79899 Other long term (current) drug therapy: Secondary | ICD-10-CM | POA: Diagnosis not present

## 2018-01-25 DIAGNOSIS — E871 Hypo-osmolality and hyponatremia: Secondary | ICD-10-CM | POA: Diagnosis not present

## 2018-01-25 DIAGNOSIS — L89153 Pressure ulcer of sacral region, stage 3: Secondary | ICD-10-CM | POA: Diagnosis not present

## 2018-01-25 DIAGNOSIS — N179 Acute kidney failure, unspecified: Secondary | ICD-10-CM | POA: Diagnosis not present

## 2018-01-25 DIAGNOSIS — F329 Major depressive disorder, single episode, unspecified: Secondary | ICD-10-CM | POA: Diagnosis not present

## 2018-01-25 DIAGNOSIS — E870 Hyperosmolality and hypernatremia: Secondary | ICD-10-CM | POA: Diagnosis not present

## 2018-01-25 DIAGNOSIS — L89621 Pressure ulcer of left heel, stage 1: Secondary | ICD-10-CM | POA: Diagnosis not present

## 2018-01-25 DIAGNOSIS — F4321 Adjustment disorder with depressed mood: Secondary | ICD-10-CM | POA: Diagnosis not present

## 2018-01-25 DIAGNOSIS — I739 Peripheral vascular disease, unspecified: Secondary | ICD-10-CM | POA: Diagnosis not present

## 2018-01-25 DIAGNOSIS — L89322 Pressure ulcer of left buttock, stage 2: Secondary | ICD-10-CM | POA: Diagnosis not present

## 2018-01-25 DIAGNOSIS — Z7902 Long term (current) use of antithrombotics/antiplatelets: Secondary | ICD-10-CM | POA: Diagnosis not present

## 2018-01-25 DIAGNOSIS — Z7982 Long term (current) use of aspirin: Secondary | ICD-10-CM | POA: Diagnosis not present

## 2018-01-25 DIAGNOSIS — R630 Anorexia: Secondary | ICD-10-CM | POA: Diagnosis not present

## 2018-01-25 DIAGNOSIS — E441 Mild protein-calorie malnutrition: Secondary | ICD-10-CM | POA: Diagnosis not present

## 2018-01-25 DIAGNOSIS — R1312 Dysphagia, oropharyngeal phase: Secondary | ICD-10-CM | POA: Diagnosis not present

## 2018-01-25 DIAGNOSIS — L89611 Pressure ulcer of right heel, stage 1: Secondary | ICD-10-CM | POA: Diagnosis not present

## 2018-01-25 DIAGNOSIS — G9341 Metabolic encephalopathy: Secondary | ICD-10-CM | POA: Diagnosis not present

## 2018-01-25 DIAGNOSIS — I2510 Atherosclerotic heart disease of native coronary artery without angina pectoris: Secondary | ICD-10-CM | POA: Diagnosis not present

## 2018-01-25 DIAGNOSIS — N289 Disorder of kidney and ureter, unspecified: Secondary | ICD-10-CM | POA: Diagnosis not present

## 2018-01-25 DIAGNOSIS — F0390 Unspecified dementia without behavioral disturbance: Secondary | ICD-10-CM | POA: Diagnosis not present

## 2018-01-25 DIAGNOSIS — I10 Essential (primary) hypertension: Secondary | ICD-10-CM | POA: Diagnosis not present

## 2018-01-25 DIAGNOSIS — L89312 Pressure ulcer of right buttock, stage 2: Secondary | ICD-10-CM | POA: Diagnosis not present

## 2018-01-25 DIAGNOSIS — R2689 Other abnormalities of gait and mobility: Secondary | ICD-10-CM | POA: Diagnosis not present

## 2018-01-25 DIAGNOSIS — R4182 Altered mental status, unspecified: Secondary | ICD-10-CM | POA: Diagnosis not present

## 2018-01-25 DIAGNOSIS — M6281 Muscle weakness (generalized): Secondary | ICD-10-CM | POA: Diagnosis not present

## 2018-01-25 DIAGNOSIS — E559 Vitamin D deficiency, unspecified: Secondary | ICD-10-CM | POA: Diagnosis not present

## 2018-01-25 DIAGNOSIS — F0150 Vascular dementia without behavioral disturbance: Secondary | ICD-10-CM | POA: Diagnosis not present

## 2018-01-25 DIAGNOSIS — W19XXXS Unspecified fall, sequela: Secondary | ICD-10-CM | POA: Diagnosis not present

## 2018-01-25 DIAGNOSIS — Z8673 Personal history of transient ischemic attack (TIA), and cerebral infarction without residual deficits: Secondary | ICD-10-CM | POA: Diagnosis not present

## 2018-01-25 LAB — BASIC METABOLIC PANEL
ANION GAP: 10 (ref 5–15)
BUN: 10 mg/dL (ref 6–20)
CHLORIDE: 113 mmol/L — AB (ref 101–111)
CO2: 21 mmol/L — ABNORMAL LOW (ref 22–32)
Calcium: 8.7 mg/dL — ABNORMAL LOW (ref 8.9–10.3)
Creatinine, Ser: 0.83 mg/dL (ref 0.44–1.00)
GFR calc Af Amer: >60 mL/min (ref >60)
Glucose, Bld: 113 mg/dL — ABNORMAL HIGH (ref 65–99)
Potassium: 3.7 mmol/L (ref 3.5–5.1)
Sodium: 144 mmol/L (ref 135–145)

## 2018-01-25 LAB — MAGNESIUM: Magnesium: 2.3 mg/dL (ref 1.7–2.4)

## 2018-01-25 MED ORDER — HYDRALAZINE HCL 50 MG PO TABS: 50.0000 mg | ORAL_TABLET | Freq: Three times a day (TID) | ORAL | Status: DC

## 2018-01-25 MED ORDER — ENSURE ENLIVE PO LIQD: 237.0000 mL | Freq: Two times a day (BID) | ORAL | 12 refills | Status: DC

## 2018-01-25 MED ORDER — HYDRALAZINE HCL 50 MG PO TABS: 50.0000 mg | ORAL_TABLET | Freq: Three times a day (TID) | ORAL | Status: AC

## 2018-01-25 MED ORDER — HYDRALAZINE HCL 20 MG/ML IJ SOLN: 10.0000 mg | Freq: Four times a day (QID) | INTRAMUSCULAR | Status: DC | PRN

## 2018-01-25 NOTE — Discharge Instructions (Signed)
Follow with Primary MD O'Buch, Greta, PA-C in 7 days   Get CBC, CMP, 2 view Chest X ray checked  by SNF MD in 5-7 days   Activity: As tolerated with Full fall precautions use walker/cane & assistance as needed  Disposition SNF  Diet:   DIET DYS 3 with feeding assistance and aspiration precautions.  For Heart failure patients - Check your Weight same time everyday, if you gain over 2 pounds, or you develop in leg swelling, experience more shortness of breath or chest pain, call your Primary MD immediately. Follow Cardiac Low Salt Diet and 1.5 lit/day fluid restriction.  Special Instructions: If you have smoked or chewed Tobacco  in the last 2 yrs please stop smoking, stop any regular Alcohol  and or any Recreational drug use.  On your next visit with your primary care physician please Get Medicines reviewed and adjusted.  Please request your Prim.MD to go over all Hospital Tests and Procedure/Radiological results at the follow up, please get all Hospital records sent to your Prim MD by signing hospital release before you go home.  If you experience worsening of your admission symptoms, develop shortness of breath, life threatening emergency, suicidal or homicidal thoughts you must seek medical attention immediately by calling 911 or calling your MD immediately  if symptoms less severe.

## 2018-01-25 NOTE — Care Management Important Message (Signed)
Important Message  Patient Details  Name: Morgan Sutton MRN: [id number] Date of Birth: Jul 04, 1946   Medicare Important Message Given:  Other (see comment)  Due to the patient illness not able to sign/Unsigned copy left a bedside   Novice Kyerra Vargo 01/25/2018, 2:12 PM

## 2018-01-25 NOTE — Progress Notes (Signed)
Patient will DC to: [facility] Anticipated DC date: 01/25/18 Family notified: Sister, son Transport by: Sherlyn HayPTAR 3pm   Per MD [facility]. RN, patient, patient's family, and facility notified of DC. Discharge Summary sent to facility. RN given number for report [phone]). DC packet on chart. Ambulance transport requested for patient.   CSW signing off.  Cristobal GoldmannNadia Bradford Cazier, LCSW Clinical Social Worker [phone]

## 2018-01-25 NOTE — Progress Notes (Signed)
Physical Therapy Treatment Patient Details Name: Morgan Sutton MRN: [id number] DOB: 13-Dec-1945 Today's Date: 01/25/2018    History of Present Illness Pt is a 72 y.o. female with medical history consisting of peripheral vascular disease status post iliac stents, recent acute CVA February 2019, dementia with psychotic features, and hypertension.  Admitted from nursing facility with increased lethargy and failure to thrive over 4 days.  Found to have AKI and hypernatremia., acute metabolic encephalopathy.     PT Comments    Pt is limited in her mobility by generalized weakness and decreased balance. Pt also has difficulty following commands. Pt currently modAx2 for bed mobility and maxAx2 for stand pivot to recliner. D/c plan continues to be appropriate. PT will continue to follow acutely until d/c.    Follow Up Recommendations  SNF     Equipment Recommendations  None recommended by PT       Precautions / Restrictions Precautions Precautions: Fall Restrictions Weight Bearing Restrictions: No    Mobility  Bed Mobility Overal bed mobility: Needs Assistance Bed Mobility: Supine to Sit;Sit to Supine     Supine to sit: HOB elevated;Max assist Sit to supine: Mod assist;+2 for physical assistance   General bed mobility comments: pt able to initiate movement of B LE, and reach across body to grab on to bedrail to pull to upright however requires modAx2 for trunk elevation and management fo LE to floor  Transfers Overall transfer level: Needs assistance Equipment used: 2 person hand held assist Transfers: Stand Pivot Transfers   Stand pivot transfers: Max assist;+2 physical assistance       General transfer comment: maxAx2 for powerup and pivoting to recliner  Ambulation/Gait             General Gait Details: unable        Balance Overall balance assessment: Needs assistance Sitting-balance support: Bilateral upper extremity supported;Feet supported Sitting  balance-Leahy Scale: Fair Sitting balance - Comments: requires B UE support and will not let go of bedrail with R UE                                    Cognition Arousal/Alertness: Awake/alert Behavior During Therapy: Flat affect Overall Cognitive Status: History of cognitive impairments - at baseline                                 General Comments: Slow to respond. Answers simple questions appropriately. Oriented to self.              Pertinent Vitals/Pain Pain Assessment: Faces Faces Pain Scale: Hurts a little bit Pain Location: generalized with movement Pain Intervention(s): Monitored during session           PT Goals (current goals can now be found in the care plan section) Acute Rehab PT Goals Patient Stated Goal: not stated PT Goal Formulation: With patient/family Time For Goal Achievement: 02/05/18 Potential to Achieve Goals: Good Progress towards PT goals: Progressing toward goals    Frequency    Min 2X/week      PT Plan Current plan remains appropriate       AM-PAC PT "6 Clicks" Daily Activity  Outcome Measure  Difficulty turning over in bed (including adjusting bedclothes, sheets and blankets)?: A Lot Difficulty moving from lying on back to sitting on the side of the bed? : Unable Difficulty sitting  down on and standing up from a chair with arms (e.g., wheelchair, bedside commode, etc,.)?: Unable Help needed moving to and from a bed to chair (including a wheelchair)?: Total Help needed walking in hospital room?: Total Help needed climbing 3-5 steps with a railing? : Total 6 Click Score: 7    End of Session Equipment Utilized During Treatment: Gait belt Activity Tolerance: Patient limited by fatigue Patient left: with call bell/phone within reach;with bed alarm set;with family/visitor present;in chair Nurse Communication: Mobility status PT Visit Diagnosis: Muscle weakness (generalized) (M62.81);Other abnormalities of  gait and mobility (R26.89)     Time: [id number] PT Time Calculation (min) (ACUTE ONLY): 11 min  Charges:  $Therapeutic Activity: 8-22 mins                    G Codes:       [name] PT, DPT Acute Rehabilitation  [phone] Pager [phone]     [name] 01/25/2018, 12:49 PM

## 2018-01-25 NOTE — Clinical Social Work Note (Signed)
Clinical Social Work Assessment  Patient Details  Name: Morgan Sutton MRN: [id number] Date of Birth: 1946-06-29  Date of referral:  01/25/18               Reason for consult:  Facility Placement                Permission sought to share information with:  Facility Medical sales representativeContact Representative, Family Supports Permission granted to share information::  Yes, Verbal Permission Granted  Name::     Emergency planning/management officerybil  Agency::  SNFs  Relationship::  Sister  Contact Information:  [phone]  Housing/Transportation Living arrangements for the past 2 months:  Skilled Nursing Facility Source of Information:  Siblings Patient Interpreter Needed:  None Criminal Activity/Legal Involvement Pertinent to Current Situation/Hospitalization:  No - Comment as needed Significant Relationships:  Siblings Lives with:  Facility Resident Do you feel safe going back to the place where you live?  No Need for family participation in patient care:  Yes (Comment)  Care giving concerns:  CSW received consult for possible SNF placement at time of discharge. CSW spoke with patient's sister. She stated that she does not [facility] and asked [facility] to [facility] for [facility]. CSW to continue to follow and assist with discharge planning needs.   Social Worker assessment / plan:  CSW spoke with patient's sister regarding discharge plan.   Employment status:  Retired Health and safety inspectornsurance information:  Armed forces operational officerMedicare, [place] Recommendations:  Skilled Nursing Facility Information / Referral to community resources:  Skilled Nursing Facility  Patient/Family's Response to care:  CSW explained that not many long term care beds are available, but that [facility] will [facility].   Patient/Family's Understanding of and Emotional Response to Diagnosis, Current Treatment, and Prognosis:  Patient/family is realistic regarding therapy needs and expressed being hopeful for SNF placement. Patient's sister  expressed understanding of CSW role and discharge process as well as medical condition. No questions/concerns about plan or treatment.    Emotional Assessment Appearance:  Appears stated age Attitude/Demeanor/Rapport:  Unable to Assess Affect (typically observed):  Pleasant Orientation:  Oriented to Self Alcohol / Substance use:  Not Applicable Psych involvement (Current and /or in the community):  No (Comment)  Discharge Needs  Concerns to be addressed:  Care Coordination Readmission within the last 30 days:  No Current discharge risk:  None Barriers to Discharge:  No Barriers Identified   Morgan Sutton, LCSWA 01/25/2018, 11:03 AM

## 2018-01-25 NOTE — Progress Notes (Addendum)
[facility] able to accept patient today. Sister and son aware and will meet her there later today.   Osborne Cascoadia Arath Kaigler LCSW [phone]

## 2018-01-25 NOTE — Clinical Social Work Placement (Signed)
   CLINICAL SOCIAL WORK PLACEMENT  NOTE  Date:  01/25/2018  Patient Details  Name: Morgan Sutton MRN: [id number] Date of Birth: [birth year]  Clinical Social Work is seeking post-discharge placement for this patient at the Skilled  Nursing Facility level of care (*CSW will initial, date and re-position this form in  chart as items are completed):  Yes   Patient/family provided with [facility] [place] Clinical Social Work Department's list of facilities offering this level of care within the geographic area requested by the patient (or if unable, by the patient's family).  Yes   Patient/family informed of their freedom to choose among providers that offer the needed level of care, that participate in Medicare, Medicaid or managed care program needed by the patient, have an available bed and are willing to accept the patient.  Yes   Patient/family informed of [facility]'s ownership interest in [facility] and [facility], as well as of the fact that they are under no obligation to receive care at these facilities.  PASRR submitted to EDS on       PASRR number received on       Existing PASRR number confirmed on 01/25/18     FL2 transmitted to all facilities in geographic area requested by pt/family on 01/25/18     FL2 transmitted to all facilities within larger geographic area on       Patient informed that his/her managed care company has contracts with or will negotiate with certain facilities, including the following:        Yes   Patient/family informed of bed offers received.  [facility]     Physician recommends and patient chooses bed at      [facility] 01/25/18.  Patient to be transferred to facility by PTAR     Patient family notified on 01/25/18 of transfer.  Name of family member notified:  Sister, Sybil     PHYSICIAN       Additional Comment:     _______________________________________________ Mearl LatinNadia S Bandon Sherwin, LCSWA 01/25/2018, 12:50 PM

## 2018-01-25 NOTE — Discharge Summary (Signed)
Morgan Sutton ZOX:[id number] DOB: 1946-05-20 DOA: 01/21/2018  PCP: Eunice Blase, PA-C  Admit date: 01/21/2018  Discharge date: 01/25/2018  Admitted From: SNF   Disposition:  SNF   Recommendations for Outpatient Follow-up:   Follow up with PCP in 1-2 weeks  PCP Please obtain BMP/CBC, 2 view CXR in 1week,  (see Discharge instructions)   PCP Please follow up on the following pending results: None   Home Health: None   Equipment/Devices: None  Consultations: None Discharge Condition: Fair   CODE STATUS: Full Diet Recommendation: DIET DYS 3     Chief Complaint  Patient presents with  . Altered Mental Status     Brief history of present illness from the day of admission and additional interim summary    Patient is a 72 y.o. female with history of recent acute CVA in February 2019, dementia, peripheral vascular disease status post bilateral PCI in 2017 transferred from skilled nursing facility for evaluation of altered mental status, found to have hypernatremia and acute kidney injury.                                                                  Hospital Course     Acute metabolic encephalopathy: Due to severe dehydration with hypernatremia and ARF -on combination of HCTZ along with ARB both of which were held, she was hydrated with D5W, hyponatremia and ARF almost completely resolved, continue to hold these offending medications, repeat BMP at SNF in 1 week.  Hypo-kalemia.  Replaced and stable.  Recent CVA: No acute change, head CT unremarkable, no focal deficits, continue dual antiplatelet therapy for secondary prevention.  History of peripheral vascular disease status post PCI in 2017 bilateral iliac arteries: Legs appear warm to touch-continue antiplatelet agents.  Hypertension:  Hold diuretics  and ARB, placed on combination of Norvasc and hydralazine.  Monitor at SNF.  CAD: No anginal symptoms-continue antiplatelets  Dementia: Suspect at baseline-mental status is back to baseline-following simple commands-but mostly pleasantly confused.  Resumed Aricept.  History of stage III sacral decubitus ulcers present on admission.  Wound care.  Mild protein calorie malnutrition.  Placed on protein supplementation.  Discharge diagnosis     Principal Problem:   Acute kidney injury [facility]) Active Problems:   Acute hypernatremia   History of CVA (cerebrovascular accident) Feb 2019   Hypertension   Dementia   PAD (peripheral artery disease) (HCC)   Acute metabolic encephalopathy   Pressure injury of skin    Discharge instructions    Discharge Instructions    Discharge instructions   Complete by:  As directed    Follow with Primary MD O'Buch, Greta, PA-C in 7 days   Get CBC, CMP, 2 view Chest X ray checked  by SNF MD in 5-7 days   Activity: As tolerated  with Full fall precautions use walker/cane & assistance as needed  Disposition SNF  Diet:   DIET DYS 3 with feeding assistance and aspiration precautions.  For Heart failure patients - Check your Weight same time everyday, if you gain over 2 pounds, or you develop in leg swelling, experience more shortness of breath or chest pain, call your Primary MD immediately. Follow Cardiac Low Salt Diet and 1.5 lit/day fluid restriction.  Special Instructions: If you have smoked or chewed Tobacco  in the last 2 yrs please stop smoking, stop any regular Alcohol  and or any Recreational drug use.  On your next visit with your primary care physician please Get Medicines reviewed and adjusted.  Please request your Prim.MD to go over all Hospital Tests and Procedure/Radiological results at the follow up, please get all Hospital records sent to your Prim MD by signing hospital release before you go home.  If you experience worsening of  your admission symptoms, develop shortness of breath, life threatening emergency, suicidal or homicidal thoughts you must seek medical attention immediately by calling 911 or calling your MD immediately  if symptoms less severe.   Increase activity slowly   Complete by:  As directed       Discharge Medications   Allergies as of 01/25/2018   No Known Allergies     Medication List    STOP taking these medications   hydrochlorothiazide 12.5 MG tablet Commonly known as:  HYDRODIURIL   losartan 100 MG tablet Commonly known as:  COZAAR     TAKE these medications   amLODipine 10 MG tablet Commonly known as:  NORVASC Take 10 mg by mouth daily.   aspirin EC 81 MG tablet Take 81 mg by mouth daily.   clopidogrel 75 MG tablet Commonly known as:  PLAVIX Take 75 mg by mouth daily.   cyanocobalamin 1000 MCG/ML injection Commonly known as:  (VITAMIN B-12) Inject 1,000 mcg into the muscle every 30 (thirty) days. On or about the 12th of each month   donepezil 10 MG tablet Commonly known as:  ARICEPT Take 10 mg by mouth at bedtime.   feeding supplement (ENSURE ENLIVE) Liqd Take 237 mLs by mouth 2 (two) times daily between meals.   hydrALAZINE 50 MG tablet Commonly known as:  APRESOLINE Take 1 tablet (50 mg total) by mouth every 8 (eight) hours.   Vitamin D3 10000 units Tabs Take 30,000 Units by mouth daily.       Follow-up Information    O'Buch, Edgardo Roys, PA-C. Schedule an appointment as soon as possible for a visit in 1 week(s).   Specialty:  Internal Medicine Contact information: [address] [place] [address] [phone]           Major procedures and Radiology Reports - PLEASE review detailed and final reports thoroughly  -        Ct Head Wo Contrast  Result Date: 01/21/2018 CLINICAL DATA:  72 year old female with altered level of consciousness. EXAM: CT HEAD WITHOUT CONTRAST TECHNIQUE: Contiguous axial images were obtained from the base of the  skull through the vertex without intravenous contrast. COMPARISON:  12/30/2017 CT and prior studies FINDINGS: Brain: No evidence of acute infarction, hemorrhage, hydrocephalus, extra-axial collection or mass lesion/mass effect. Atrophy, chronic small-vessel white matter ischemic changes and remote thalamic, basal ganglia and cerebellar infarcts again noted. Vascular: Atherosclerotic calcifications noted Skull: No acute abnormality Sinuses/Orbits: No acute abnormality. Other: None. IMPRESSION: 1. No evidence of acute intracranial abnormality. 2. Atrophy, chronic  small-vessel white matter ischemic changes and remote infarcts as described. Electronically Signed   By: Harmon Pier M.D.   On: 01/21/2018 16:20   Dg Chest Port 1 View  Result Date: 01/21/2018 CLINICAL DATA:  Observed by the RN and family to be more lethargic and failure to thrive over the last 4 days. Pt following all commands, has hx of dementia and is oriented to self. EXAM: PORTABLE CHEST 1 VIEW COMPARISON:  12/30/2017 FINDINGS: The heart size and mediastinal contours are within normal limits. Both lungs are clear. The visualized skeletal structures are unremarkable. IMPRESSION: No active disease. Electronically Signed   By: Elige Ko   On: 01/21/2018 15:05    Micro Results     Recent Results (from the past 240 hour(s))  Urine culture     Status: Abnormal   Collection Time: 01/21/18  4:28 PM  Result Value Ref Range Status   Specimen Description URINE, CATHETERIZED  Final   Special Requests   Final    NONE Performed at [facility], [address] [place], [place] [address]    Culture 60,000 COLONIES/mL ESCHERICHIA COLI (A)  Final   Report Status 01/23/2018 FINAL  Final   Organism ID, Bacteria ESCHERICHIA COLI (A)  Final      Susceptibility   Escherichia coli - MIC*    AMPICILLIN <=2 SENSITIVE Sensitive     CEFAZOLIN <=4 SENSITIVE Sensitive     CEFTRIAXONE <=1 SENSITIVE Sensitive     CIPROFLOXACIN <=0.25 SENSITIVE  Sensitive     GENTAMICIN <=1 SENSITIVE Sensitive     IMIPENEM <=0.25 SENSITIVE Sensitive     NITROFURANTOIN <=16 SENSITIVE Sensitive     TRIMETH/SULFA <=20 SENSITIVE Sensitive     AMPICILLIN/SULBACTAM <=2 SENSITIVE Sensitive     PIP/TAZO <=4 SENSITIVE Sensitive     Extended ESBL NEGATIVE Sensitive     * 60,000 COLONIES/mL ESCHERICHIA COLI  MRSA PCR Screening     Status: None   Collection Time: 01/22/18  4:34 PM  Result Value Ref Range Status   MRSA by PCR NEGATIVE NEGATIVE Final    Comment:        The GeneXpert MRSA Assay (FDA approved for NASAL specimens only), is one component of a comprehensive MRSA colonization surveillance program. It is not intended to diagnose MRSA infection nor to guide or monitor treatment for MRSA infections. Performed at [facility], [address] [place], [place] [address]     Today   Subjective    Jairy Angulo today has no headache,no chest abdominal pain,no new weakness tingling or numbness, feels much better  .     Objective   Blood pressure (!) 171/81, pulse (!) 49, temperature (!) 97.5 F (36.4 C), temperature source Oral, resp. rate 20, height 5\' 6"  (1.676 m), weight 75.7 kg (166 lb 14.2 oz), SpO2 97 %.   Intake/Output Summary (Last 24 hours) at 01/25/2018 1238 Last data filed at 01/25/2018 0559 Gross per 24 hour  Intake 1738.17 ml  Output 550 ml  Net 1188.17 ml    Exam Awake , No new F.N deficits, Normal affect Wilber.AT,PERRAL Supple Neck,No JVD, No cervical lymphadenopathy appriciated.  Symmetrical Chest wall movement, Good air movement bilaterally, CTAB RRR,No Gallops,Rubs or new Murmurs, No Parasternal Heave +ve B.Sounds, Abd Soft, Non tender, No organomegaly appriciated, No rebound -guarding or rigidity. No Cyanosis, Clubbing or edema, No new Rash or bruise   Data Review   CBC w Diff:  Lab Results  Component Value Date  WBC 8.2 01/22/2018   HGB 14.7 01/22/2018   HCT 48.8 (H) 01/22/2018   PLT 138 (L) 01/22/2018     LYMPHOPCT 26 01/21/2018   MONOPCT 6 01/21/2018   EOSPCT 3 01/21/2018   BASOPCT 0 01/21/2018    CMP:  Lab Results  Component Value Date   NA 144 01/25/2018   K 3.7 01/25/2018   CL 113 (H) 01/25/2018   CO2 21 (L) 01/25/2018   BUN 10 01/25/2018   CREATININE 0.83 01/25/2018   PROT 6.0 (L) 01/22/2018   ALBUMIN 3.1 (L) 01/22/2018   BILITOT 1.4 (H) 01/22/2018   ALKPHOS 74 01/22/2018   AST 36 01/22/2018   ALT 44 01/22/2018  .   Total Time in preparing paper work, data evaluation and todays exam - 35 minutes  Susa RaringPrashant Singh M.D on 01/25/2018 at 12:38 PM  [facility]   Office  [phone]

## 2018-01-26 ENCOUNTER — Non-Acute Institutional Stay (SKILLED_NURSING_FACILITY): Payer: Medicare Other | Admitting: Internal Medicine

## 2018-01-26 ENCOUNTER — Non-Acute Institutional Stay: Payer: Self-pay

## 2018-01-26 DIAGNOSIS — Z8673 Personal history of transient ischemic attack (TIA), and cerebral infarction without residual deficits: Secondary | ICD-10-CM | POA: Diagnosis not present

## 2018-01-26 DIAGNOSIS — E870 Hyperosmolality and hypernatremia: Secondary | ICD-10-CM

## 2018-01-26 DIAGNOSIS — I10 Essential (primary) hypertension: Secondary | ICD-10-CM

## 2018-01-26 DIAGNOSIS — I739 Peripheral vascular disease, unspecified: Secondary | ICD-10-CM

## 2018-01-26 DIAGNOSIS — F0150 Vascular dementia without behavioral disturbance: Secondary | ICD-10-CM

## 2018-01-26 NOTE — Progress Notes (Signed)
Location:  [facility] Room Number: 219-A Place of Service:  SNF [phone](31) Provider:  Mahlon GammonGupta, Jerryl Holzhauer L., MD   Patient Care Team: Otila Back'Buch, Greta, PA-C as PCP - General (Internal Medicine)  Extended Emergency Contact Information Primary Emergency Contact: Toni ArthursLasley, Sybil Mobile Phone: [phone] Relation: Sister Secondary Emergency Contact: [facility] Center[facility]LEY,GREG Address: [address]          [place],  [id number] Home Phone: [phone] Relation: None  Code Status:  Full Code Goals of care: Advanced Directive information Advanced Directives 01/23/2018  Does Patient Have a Medical Advance Directive? No  Would patient like information on creating a medical advance directive? Yes (Inpatient - patient defers creating a medical advance directive at this time)     Chief Complaint  Patient presents with  . New Admit To SNF    New admission to [facility]    HPI:  Pt is a 72 y.o. female seen today for New Admit to the SNF for Therapy and possible Long term care after being in the hospital from 03/01-03/05  Patient has h/o CVA with Acute CVA in Feb 2019., Hypertension, Hyperlipidemia, Dementia , CAD and PAD s/p Bilateral Iliac Stents in 2017.   She [facility]F [facility] Feb [facility] after Acute CVA. In that facility patient progressively got Weak and Somnolent. Per her sister in the room patient was on Puree diet and was not eating.No h/o Vomiting or Diarrhea  Her Labs in the ED showed Hypernatremia with Sodium of 158. She was slowly hydrated. Her HCTZ and ARB were discontinued. Head CT scan was negative for any Acute Changes. It just showed old Infarcts. She was started on Norvasc and Hydralazine for her BP control. Her diet was changed to D3  Patient was unable to give me any history. Her sister was in the room and seems to think her Sister is back to baseline. Patient was staying with her Son before Acute stroke in Feb. She was dependent for her ADLS  and had diagnosis of Dementia before this episode. Per sister patient would need Long term care .    Past Medical History:  Diagnosis Date  . Cerebral infarction Feb 2019   . Dementia   . Hypertension   . Peripheral vascular disease (HCC)   . Stroke [facility](HCC)    History reviewed. No pertinent surgical history.  No Known Allergies  Outpatient Encounter Medications as of 01/26/2018  Medication Sig  . amLODipine (NORVASC) 10 MG tablet Take 10 mg by mouth daily.  Marland Kitchen. aspirin EC 81 MG tablet Take 81 mg by mouth daily.  . Cholecalciferol (VITAMIN D3) 10000 units TABS Take 20,000 Units by mouth daily.   . clopidogrel (PLAVIX) 75 MG tablet Take 75 mg by mouth daily.  . cyanocobalamin (,VITAMIN B-12,) 1000 MCG/ML injection Inject 1,000 mcg into the muscle every 30 (thirty) days. On or about the 12th of each month  . donepezil (ARICEPT) 10 MG tablet Take 10 mg by mouth at bedtime.  . feeding supplement, ENSURE ENLIVE, (ENSURE ENLIVE) LIQD Take 237 mLs by mouth 2 (two) times daily between meals.  . hydrALAZINE (APRESOLINE) 50 MG tablet Take 1 tablet (50 mg total) by mouth every 8 (eight) hours.   No facility-administered encounter medications on file as of 01/26/2018.     Review of Systems  Unable to perform ROS: Dementia     There is no immunization history on file for this patient. Pertinent  Health Maintenance Due  Topic Date  Due  . MAMMOGRAM  05/02/1996  . DEXA SCAN  05/03/2011  . PNA vac Low Risk Adult (1 of 2 - PCV13) 05/03/2011  . INFLUENZA VACCINE  06/23/2017  . COLONOSCOPY  09/07/2017      Vitals:   01/26/18 0831  BP: 114/70  Pulse: 68  Resp: 18  Temp: (!) 97.5 F (36.4 C)  TempSrc: Oral  SpO2: 91%  Weight: 166 lb 14.2 oz (75.7 kg)  Height: 5\' 6"  (1.676 m)   Body mass index is 26.94 kg/m. Physical Exam  Constitutional: She appears well-developed and well-nourished.  HENT:  Head: Normocephalic.  Mouth/Throat: Oropharynx is clear and moist.  Eyes: Pupils are equal,  round, and reactive to light.  Neck: Neck supple.  Cardiovascular: Normal rate and normal heart sounds.  No murmur heard. Pulmonary/Chest: Effort normal and breath sounds normal. No respiratory distress. She has no wheezes. She has no rales.  Abdominal: Soft. Bowel sounds are normal. She exhibits no distension. There is no tenderness. There is no rebound.  Musculoskeletal: She exhibits no edema.  Lymphadenopathy:    She has no cervical adenopathy.  Neurological: She is alert.  Responds to Simple Commands. Did not check the Gait. But did not have any focal deficits. W Was not oriented to Place or time. Did have some Aphasia. Thought was able to name objects  Skin: Skin is warm and dry.  Psychiatric: She has a normal mood and affect. Her behavior is normal. Thought content normal.    Labs reviewed: Recent Labs    01/23/18 1041 01/24/18 0619 01/25/18 0722  NA 146* 147* 144  K 3.0* 3.1* 3.7  CL 112* 114* 113*  CO2 22 23 21*  GLUCOSE 129* 132* 113*  BUN 18 16 10   CREATININE 0.91 1.08* 0.83  CALCIUM 8.6* 8.7* 8.7*  MG  --   --  2.3   Recent Labs    01/21/18 1510 01/22/18 0345  AST 38 36  ALT 48 44  ALKPHOS 82 74  BILITOT 1.1 1.4*  PROT 6.7 6.0*  ALBUMIN 3.5 3.1*   Recent Labs    01/21/18 1510 01/22/18 0345  WBC 10.2 8.2  NEUTROABS 6.7  --   HGB 16.1* 14.7  HCT 52.8* 48.8*  MCV 95.0 96.1  PLT 189 138*    Significant Diagnostic Results in last 30 days:  Ct Head Wo Contrast  Result Date: 01/21/2018 CLINICAL DATA:  72 year old female with altered level of consciousness. EXAM: CT HEAD WITHOUT CONTRAST TECHNIQUE: Contiguous axial images were obtained from the base of the skull through the vertex without intravenous contrast. COMPARISON:  12/30/2017 CT and prior studies FINDINGS: Brain: No evidence of acute infarction, hemorrhage, hydrocephalus, extra-axial collection or mass lesion/mass effect. Atrophy, chronic small-vessel white matter ischemic changes and remote  thalamic, basal ganglia and cerebellar infarcts again noted. Vascular: Atherosclerotic calcifications noted Skull: No acute abnormality Sinuses/Orbits: No acute abnormality. Other: None. IMPRESSION: 1. No evidence of acute intracranial abnormality. 2. Atrophy, chronic small-vessel white matter ischemic changes and remote infarcts as described. Electronically Signed   By: Harmon Pier M.D.   On: 01/21/2018 16:20   Dg Chest Port 1 View  Result Date: 01/21/2018 CLINICAL DATA:  Observed by the RN and family to be more lethargic and failure to thrive over the last 4 days. Pt following all commands, has hx of dementia and is oriented to self. EXAM: PORTABLE CHEST 1 VIEW COMPARISON:  12/30/2017 FINDINGS: The heart size and mediastinal contours are within normal limits. Both lungs are  clear. The visualized skeletal structures are unremarkable. IMPRESSION: No active disease. Electronically Signed   By: Elige Ko   On: 01/21/2018 15:05    Assessment/Plan  Acute hypernatremia Most likely Due to decrased oral intake. She is on D3 diet now. Encourage PO Feed and Fluids in facility Repeat BMP in 1 week. Speech to evaluate closely.   History of CVA (cerebrovascular accident) Feb 2019 With Number of Chronic Strokes on CT scan Continue on aspirin and Plavix Will also restart her on Lipitor. Monitor BP. Start Her On PT an dOYT Don't think she will do well due to her Dementia Need follow up with Neurology  Essential hypertension She was taken off HCTZ and ARB due to dehydration She is on Hydralazine and Norvasc Vitals Q shift for few days to monitor BP Goal of less then 130/90 due to CVA  Vascular dementia  On aspirin Start Statin BP control She is on Aricept   PAD (peripheral artery disease)  Continue on Aspirin and Plavix. Sacral Decubitus Ulcer D/W Nurse It is Unstageable right now.  Plan of Doing Santyl QD    Family/ staff Communication: D/W Sister  Labs/tests ordered:  BMP and  CBC Total time spent in this patient care encounter was 45_ minutes; greater than 50% of the visit spent counseling patient, reviewing records , Labs and coordinating care for problems addressed at this encounter. Marland Kitchen

## 2018-01-31 DIAGNOSIS — L89322 Pressure ulcer of left buttock, stage 2: Secondary | ICD-10-CM | POA: Diagnosis not present

## 2018-02-01 ENCOUNTER — Encounter: Payer: Self-pay | Admitting: Internal Medicine

## 2018-02-01 NOTE — Progress Notes (Signed)
Location:  [facility] Room Number: 219-A Place of Service:  SNF [phone]) Provider:  Mahlon Gammon, MD  Patient Care Team: Otila Back as PCP - General (Internal Medicine)  Extended Emergency Contact Information Primary Emergency Contact: Aliha, Diedrich Mobile Phone: [phone] Relation: Sister Secondary Emergency Contact: [facility] Address: [address]          [place],  [address] Home Phone: [phone] Relation: None  Code Status:  Full Code Goals of care: Advanced Directive information Advanced Directives 01/23/2018  Does Patient Have a Medical Advance Directive? No  Would patient like information on creating a medical advance directive? Yes (Inpatient - patient defers creating a medical advance directive at this time)         Chief Complaint  Patient presents with  . New Admit To SNF    New admission to [facility]    HPI:  Pt is a 72 y.o. female seen today for New Admit to the SNF for Therapy and possible Long term care after being in the hospital from 03/01-03/05  Patient has h/o CVA with Acute CVA in Feb 2019., Hypertension, Hyperlipidemia, Dementia , CAD and PAD s/p Bilateral Iliac Stents in 2017.   She was discharged to the SNF from [facility] in Feb 2019 after Acute CVA. In that facility patient progressively got Weak and Somnolent. Per her sister in the room patient was on Puree diet and was not eating.No h/o Vomiting or Diarrhea  Her Labs in the ED showed Hypernatremia with Sodium of 158. She was slowly hydrated. Her HCTZ and ARB were discontinued. Head CT scan was negative for any Acute Changes. It just showed old Infarcts. She was started on Norvasc and Hydralazine for her BP control. Her diet was changed to D3  Patient was unable to give me any history. Her sister was in the room and seems to think her Sister is back to baseline. Patient was staying with her Son before Acute stroke in Feb. She was dependent  for her ADLS and had diagnosis of Dementia before this episode. Per sister patient would need Long term care .        Past Medical History:  Diagnosis Date  . Cerebral infarction Feb 2019   . Dementia   . Hypertension   . Peripheral vascular disease (HCC)   . Stroke [facility])    History reviewed. No pertinent surgical history.  No Known Allergies      Outpatient Encounter Medications as of 01/26/2018  Medication Sig  . amLODipine (NORVASC) 10 MG tablet Take 10 mg by mouth daily.  Marland Kitchen aspirin EC 81 MG tablet Take 81 mg by mouth daily.  . Cholecalciferol (VITAMIN D3) 10000 units TABS Take 20,000 Units by mouth daily.   . clopidogrel (PLAVIX) 75 MG tablet Take 75 mg by mouth daily.  . cyanocobalamin (,VITAMIN B-12,) 1000 MCG/ML injection Inject 1,000 mcg into the muscle every 30 (thirty) days. On or about the 12th of each month  . donepezil (ARICEPT) 10 MG tablet Take 10 mg by mouth at bedtime.  . feeding supplement, ENSURE ENLIVE, (ENSURE ENLIVE) LIQD Take 237 mLs by mouth 2 (two) times daily between meals.  . hydrALAZINE (APRESOLINE) 50 MG tablet Take 1 tablet (50 mg total) by mouth every 8 (eight) hours.   No facility-administered encounter medications on file as of 01/26/2018.     Review of Systems  Unable to perform ROS: Dementia     There is no immunization history on  file for this patient.     Pertinent  Health Maintenance Due  Topic Date Due  . MAMMOGRAM  05/02/1996  . DEXA SCAN  05/03/2011  . PNA vac Low Risk Adult (1 of 2 - PCV13) 05/03/2011  . INFLUENZA VACCINE  06/23/2017  . COLONOSCOPY  09/07/2017       Vitals:   01/26/18 0831  BP: 114/70  Pulse: 68  Resp: 18  Temp: (!) 97.5 F (36.4 C)  TempSrc: Oral  SpO2: 91%  Weight: 166 lb 14.2 oz (75.7 kg)  Height: 5\' 6"  (1.676 m)   Body mass index is 26.94 kg/m. Physical Exam  Constitutional: She appears well-developed and well-nourished.  HENT:  Head: Normocephalic.  Mouth/Throat:  Oropharynx is clear and moist.  Eyes: Pupils are equal, round, and reactive to light.  Neck: Neck supple.  Cardiovascular: Normal rate and normal heart sounds.  No murmur heard. Pulmonary/Chest: Effort normal and breath sounds normal. No respiratory distress. She has no wheezes. She has no rales.  Abdominal: Soft. Bowel sounds are normal. She exhibits no distension. There is no tenderness. There is no rebound.  Musculoskeletal: She exhibits no edema.  Lymphadenopathy:    She has no cervical adenopathy.  Neurological: She is alert.  Responds to Simple Commands. Did not check the Gait. But did not have any focal deficits. W Was not oriented to Place or time. Did have some Aphasia. Thought was able to name objects  Skin: Skin is warm and dry.  Psychiatric: She has a normal mood and affect. Her behavior is normal. Thought content normal.    Labs reviewed: RecentLabs(withinlast365days)  Recent Labs    01/23/18 1041 01/24/18 0619 01/25/18 0722  NA 146* 147* 144  K 3.0* 3.1* 3.7  CL 112* 114* 113*  CO2 22 23 21*  GLUCOSE 129* 132* 113*  BUN 18 16 10   CREATININE 0.91 1.08* 0.83  CALCIUM 8.6* 8.7* 8.7*  MG  --   --  2.3     RecentLabs(withinlast365days)  Recent Labs    01/21/18 1510 01/22/18 0345  AST 38 36  ALT 48 44  ALKPHOS 82 74  BILITOT 1.1 1.4*  PROT 6.7 6.0*  ALBUMIN 3.5 3.1*     RecentLabs(withinlast365days)  Recent Labs    01/21/18 1510 01/22/18 0345  WBC 10.2 8.2  NEUTROABS 6.7  --   HGB 16.1* 14.7  HCT 52.8* 48.8*  MCV 95.0 96.1  PLT 189 138*      Significant Diagnostic Results in last 30 days:   ImagingResults  Ct Head Wo Contrast  Result Date: 01/21/2018 CLINICAL DATA:  72 year old female with altered level of consciousness. EXAM: CT HEAD WITHOUT CONTRAST TECHNIQUE: Contiguous axial images were obtained from the base of the skull through the vertex without intravenous contrast. COMPARISON:  12/30/2017 CT and prior studies  FINDINGS: Brain: No evidence of acute infarction, hemorrhage, hydrocephalus, extra-axial collection or mass lesion/mass effect. Atrophy, chronic small-vessel white matter ischemic changes and remote thalamic, basal ganglia and cerebellar infarcts again noted. Vascular: Atherosclerotic calcifications noted Skull: No acute abnormality Sinuses/Orbits: No acute abnormality. Other: None. IMPRESSION: 1. No evidence of acute intracranial abnormality. 2. Atrophy, chronic small-vessel white matter ischemic changes and remote infarcts as described. Electronically Signed   By: Harmon Pier M.D.   On: 01/21/2018 16:20   Dg Chest Port 1 View  Result Date: 01/21/2018 CLINICAL DATA:  Observed by the RN and family to be more lethargic and failure to thrive over the last 4 days. Pt following all  commands, has hx of dementia and is oriented to self. EXAM: PORTABLE CHEST 1 VIEW COMPARISON:  12/30/2017 FINDINGS: The heart size and mediastinal contours are within normal limits. Both lungs are clear. The visualized skeletal structures are unremarkable. IMPRESSION: No active disease. Electronically Signed   By: Elige KoHetal  Patel   On: 01/21/2018 15:05     Assessment/Plan  Acute hypernatremia Most likely Due to decrased oral intake. She is on D3 diet now. Encourage PO Feed and Fluids in facility Repeat BMP in 1 week. Speech to evaluate closely.   History of CVA (cerebrovascular accident) Feb 2019 With Number of Chronic Strokes on CT scan Continue on aspirin and Plavix Will also restart her on Lipitor. Monitor BP. Start Her On PT an dOYT Don't think she will do well due to her Dementia Need follow up with Neurology  Essential hypertension She was taken off HCTZ and ARB due to dehydration She is on Hydralazine and Norvasc Vitals Q shift for few days to monitor BP Goal of less then 130/90 due to CVA  Vascular dementia  On aspirin Start Statin BP control She is on Aricept   PAD (peripheral artery  disease)  Continue on Aspirin and Plavix. Sacral Decubitus Ulcer D/W Nurse It is Unstageable right now.  Plan of Doing Santyl QD    Family/ staff Communication: D/W Sister  Labs/tests ordered:  BMP and CBC Total time spent in this patient care encounter was 45_ minutes; greater than 50% of the visit spent counseling patient, reviewing records , Labs and coordinating care for problems addressed at this encounter. .    Communication Routing History   There are no sent or routed communications associated with this encounter.  Orders Placed    None  Medication Changes     None    Medication List  Visit Diagnoses      Acute hypernatremia    History of CVA (cerebrovascular accident) Feb 2019    Essential hypertension    Vascular dementia without behavioral disturbance    PAD (peripheral artery disease) (HCC)    Problem List  Level of Service   Level of Service  PR INITIAL NURSING FACILITY CARE/DAY 45 MINUTES [phone][[id number]]  All Charges for This Encounter   Code  [id number]  Description: PR INITIAL NURSING FACILITY CARE/DAY 45 MINUTES  Service Date: 01/26/2018  Service Provider: Mahlon GammonGupta, Anjali L, MD  Qty: 1

## 2018-02-02 LAB — LIPID PANEL
Cholesterol: 138 (ref 0–200)
HDL: 48 (ref 35–70)
LDL Cholesterol: 73
LDL/HDL RATIO: 2.9
Triglycerides: 87 (ref 40–160)

## 2018-02-02 LAB — BASIC METABOLIC PANEL
BUN: 15 (ref 4–21)
Creatinine: 0.7 (ref 0.5–1.1)
Glucose: 118
POTASSIUM: 3.8 (ref 3.4–5.3)
SODIUM: 142 (ref 137–147)

## 2018-02-02 LAB — CBC AND DIFFERENTIAL
HCT: 46 (ref 36–46)
Hemoglobin: 14.7 (ref 12.0–16.0)
Neutrophils Absolute: 5
Platelets: 231 (ref 150–399)
WBC: 8.3

## 2018-02-07 NOTE — Progress Notes (Signed)
This encounter was created in error - please disregard.

## 2018-02-07 NOTE — Addendum Note (Signed)
Addended by: Karin LieuBAILEY, Jen Benedict T on: 02/07/2018 10:02 AM   Modules accepted: Level of Service, SmartSet

## 2018-02-09 NOTE — Addendum Note (Signed)
Addended by: Karin LieuBAILEY, Annemarie Sebree T on: 02/09/2018 09:41 AM   Modules accepted: Level of Service, SmartSet

## 2018-02-09 NOTE — Progress Notes (Signed)
This encounter was created in error - please disregard.

## 2018-02-14 DIAGNOSIS — L89312 Pressure ulcer of right buttock, stage 2: Secondary | ICD-10-CM | POA: Diagnosis not present

## 2018-02-14 DIAGNOSIS — L89322 Pressure ulcer of left buttock, stage 2: Secondary | ICD-10-CM | POA: Diagnosis not present

## 2018-02-21 DIAGNOSIS — L89312 Pressure ulcer of right buttock, stage 2: Secondary | ICD-10-CM | POA: Diagnosis not present

## 2018-02-21 DIAGNOSIS — L89611 Pressure ulcer of right heel, stage 1: Secondary | ICD-10-CM | POA: Diagnosis not present

## 2018-02-21 DIAGNOSIS — L89621 Pressure ulcer of left heel, stage 1: Secondary | ICD-10-CM | POA: Diagnosis not present

## 2018-02-21 DIAGNOSIS — L89322 Pressure ulcer of left buttock, stage 2: Secondary | ICD-10-CM | POA: Diagnosis not present

## 2018-02-22 ENCOUNTER — Non-Acute Institutional Stay (SKILLED_NURSING_FACILITY): Payer: Medicare Other | Admitting: Adult Health

## 2018-02-22 ENCOUNTER — Encounter: Payer: Self-pay | Admitting: Adult Health

## 2018-02-22 DIAGNOSIS — E538 Deficiency of other specified B group vitamins: Secondary | ICD-10-CM

## 2018-02-22 DIAGNOSIS — L89153 Pressure ulcer of sacral region, stage 3: Secondary | ICD-10-CM

## 2018-02-22 DIAGNOSIS — I10 Essential (primary) hypertension: Secondary | ICD-10-CM | POA: Diagnosis not present

## 2018-02-22 DIAGNOSIS — E559 Vitamin D deficiency, unspecified: Secondary | ICD-10-CM | POA: Diagnosis not present

## 2018-02-22 DIAGNOSIS — R638 Other symptoms and signs concerning food and fluid intake: Secondary | ICD-10-CM | POA: Diagnosis not present

## 2018-02-22 DIAGNOSIS — Z8673 Personal history of transient ischemic attack (TIA), and cerebral infarction without residual deficits: Secondary | ICD-10-CM | POA: Diagnosis not present

## 2018-02-22 DIAGNOSIS — F0150 Vascular dementia without behavioral disturbance: Secondary | ICD-10-CM | POA: Diagnosis not present

## 2018-02-22 LAB — BASIC METABOLIC PANEL
BUN: 21 (ref 4–21)
Creatinine: 0.6 (ref 0.5–1.1)
GLUCOSE: 105
POTASSIUM: 4 (ref 3.4–5.3)
SODIUM: 150 — AB (ref 137–147)

## 2018-02-22 LAB — CBC AND DIFFERENTIAL
HCT: 48 — AB (ref 36–46)
Hemoglobin: 15.8 (ref 12.0–16.0)
NEUTROS ABS: 6
Platelets: 277 (ref 150–399)
WBC: 9.8

## 2018-02-22 NOTE — Progress Notes (Signed)
Location:  [facility] Room Number: 219 Place of Service:  SNF (31) Provider:  Kenard Gower, NP  Patient Care Team: Otila Back as PCP - General (Internal Medicine)  Extended Emergency Contact Information Primary Emergency Contact: Shanira, Tine Mobile Phone: [phone] Relation: Sister Secondary Emergency Contact: [facility] Address: [address]          [place],  [address] Home Phone: [phone] Relation: None  Code Status:  DNR  Goals of care: Advanced Directive information Advanced Directives 01/23/2018  Does Patient Have a Medical Advance Directive? No  Would patient like information on creating a medical advance directive? Yes (Inpatient - patient defers creating a medical advance directive at this time)     Chief Complaint  Patient presents with  . Health Maintenance    Routine [facility] visit     HPI:  Pt is a 72 y.o. female seen today for medical management of chronic diseases.  She is a short-term rehabilitation resident at [facility].  She has a PMH of dementia, hypertension, PVD, and stroke. She was seen in her room today. She complained of constipation. She was reported to have poor oral intake. BPs are well-controlled. Latest weight is 163.8 lbs Body mass index is 26.44 kg/m.   Past Medical History:  Diagnosis Date  . Cerebral infarction Feb 2019   . Dementia   . Hypertension   . Peripheral vascular disease (HCC)   . Stroke [facility])    History reviewed. No pertinent surgical history.  No Known Allergies  Outpatient Encounter Medications as of 02/22/2018  Medication Sig  . amLODipine (NORVASC) 10 MG tablet Take 10 mg by mouth daily.  Marland Kitchen aspirin EC 81 MG tablet Take 81 mg by mouth daily.  Marland Kitchen atorvastatin (LIPITOR) 20 MG tablet Take 20 mg by mouth daily.  . cholecalciferol (VITAMIN D) 1000 units tablet Take 2,000 Units by mouth daily. Take 2 tablets to = 2000 units  . clopidogrel  (PLAVIX) 75 MG tablet Take 75 mg by mouth daily.  . cyanocobalamin (,VITAMIN B-12,) 1000 MCG/ML injection Inject 1,000 mcg into the muscle every 30 (thirty) days. On or about the 12th of each month  . donepezil (ARICEPT) 10 MG tablet Take 10 mg by mouth at bedtime.  . hydrALAZINE (APRESOLINE) 50 MG tablet Take 1 tablet (50 mg total) by mouth every 8 (eight) hours.  . NUTRITIONAL SUPPLEMENT LIQD Take 120 mLs by mouth 2 (two) times daily. MedPass  . [DISCONTINUED] Cholecalciferol (VITAMIN D3) 10000 units TABS Take 20,000 Units by mouth daily.   . [DISCONTINUED] feeding supplement, ENSURE ENLIVE, (ENSURE ENLIVE) LIQD Take 237 mLs by mouth 2 (two) times daily between meals.   No facility-administered encounter medications on file as of 02/22/2018.     Review of Systems  Unable to obtain due to dementia    Immunization History  Administered Date(s) Administered  . Influenza-Unspecified 08/23/2017   Pertinent  Health Maintenance Due  Topic Date Due  . MAMMOGRAM  05/02/1996  . DEXA SCAN  05/03/2011  . PNA vac Low Risk Adult (1 of 2 - PCV13) 05/03/2011  . COLONOSCOPY  09/07/2017  . INFLUENZA VACCINE  06/23/2018      Vitals:   02/22/18 1021  BP: 106/68  Pulse: 67  Resp: 17  Temp: 98 F (36.7 C)  TempSrc: Oral  SpO2: 94%  Weight: 163 lb 12.8 oz (74.3 kg)  Height: 5\' 6"  (1.676 m)   Body mass index is 26.44 kg/m.  Physical  Exam  GENERAL APPEARANCE:  In no acute distress. Normal body habitus SKIN:  Sacral pressure ulcer stage 3, bilateral heel with DTI MOUTH and THROAT: Lips are without lesions. Oral mucosa is dry RESPIRATORY: Breathing is even & unlabored, BS CTAB CARDIAC: RRR, no murmur,no extra heart sounds, no edema GI: Abdomen soft, normal BS, no masses, no tenderness EXTREMITIES:  No cyanosis, no edema PSYCHIATRIC: Alert to self, disoriented to time and place. Affect and behavior are appropriate   Labs reviewed: Recent Labs    01/23/18 1041 01/24/18 0619  01/25/18 0722 02/02/18  NA 146* 147* 144 142  K 3.0* 3.1* 3.7 3.8  CL 112* 114* 113*  --   CO2 22 23 21*  --   GLUCOSE 129* 132* 113*  --   BUN 18 16 10 15   CREATININE 0.91 1.08* 0.83 0.7  CALCIUM 8.6* 8.7* 8.7*  --   MG  --   --  2.3  --    Recent Labs    01/21/18 1510 01/22/18 0345  AST 38 36  ALT 48 44  ALKPHOS 82 74  BILITOT 1.1 1.4*  PROT 6.7 6.0*  ALBUMIN 3.5 3.1*   Recent Labs    01/21/18 1510 01/22/18 0345 02/02/18  WBC 10.2 8.2 8.3  NEUTROABS 6.7  --  5  HGB 16.1* 14.7 14.7  HCT 52.8* 48.8* 46  MCV 95.0 96.1  --   PLT 189 138* 231    Lab Results  Component Value Date   CHOL 138 02/02/2018   HDL 48 02/02/2018   LDLCALC 73 02/02/2018   TRIG 87 02/02/2018     Assessment/Plan  1. History of CVA (cerebrovascular accident) - stable, continue Plavix 75 mg daily, ASA EC 81 mg daily, Atorvastatin 20 mg 1 tab daily, Hydralazine 50 mg Q 8 hours, Amlodipine 10 mg 1 tab daily   2. Essential hypertension - well-controlled, continue Hydralazine 50 mg Q 8 hours, Amlodipine 10 mg 1 tab daily   3. Decreased oral intake - will do CBC and BMP to check for dehydration, encourage oral fluid intake, Medpass 120 ml BID   4. Vitamin D deficiency - continue Vitamin D3 1,000 units give 2 tabs = 2,000 units daily   5. Vitamin B12 deficiency - continue Cyanocobalamin 1,000 mcg IM monthly   6. Pressure ulcer of sacral region, stage 3 (HCC) - continue wound treatment daily, keep skin clean and dry  7. Dementia without behavioral disturbance - continue supportive care, fall precautions    Family/ staff Communication:  Discussed plan of care with resident and charge nurse.  Labs/tests ordered:  BMP and CBC stat  Goals of care:   Short-term care  Kenard GowerMonina Medina-Vargas, NP [facility] [phone] (Monday-Friday 8:00 a.m. - 5:00 p.m.) [phone] (after hours)

## 2018-02-23 LAB — BASIC METABOLIC PANEL
BUN: 18 (ref 4–21)
Creatinine: 0.7 (ref 0.5–1.1)
Glucose: 107
Potassium: 3.3 — AB (ref 3.4–5.3)
Sodium: 149 — AB (ref 137–147)

## 2018-02-28 DIAGNOSIS — L89322 Pressure ulcer of left buttock, stage 2: Secondary | ICD-10-CM | POA: Diagnosis not present

## 2018-02-28 DIAGNOSIS — L89621 Pressure ulcer of left heel, stage 1: Secondary | ICD-10-CM | POA: Diagnosis not present

## 2018-02-28 DIAGNOSIS — L89611 Pressure ulcer of right heel, stage 1: Secondary | ICD-10-CM | POA: Diagnosis not present

## 2018-02-28 DIAGNOSIS — L89312 Pressure ulcer of right buttock, stage 2: Secondary | ICD-10-CM | POA: Diagnosis not present

## 2018-02-28 LAB — BASIC METABOLIC PANEL
BUN: 13 (ref 4–21)
CREATININE: 0.6 (ref 0.5–1.1)
GLUCOSE: 106
POTASSIUM: 3.6 (ref 3.4–5.3)
Sodium: 145 (ref 137–147)

## 2018-03-01 ENCOUNTER — Encounter: Payer: Self-pay | Admitting: Adult Health

## 2018-03-01 ENCOUNTER — Non-Acute Institutional Stay (SKILLED_NURSING_FACILITY): Payer: Medicare Other | Admitting: Adult Health

## 2018-03-01 DIAGNOSIS — W19XXXS Unspecified fall, sequela: Secondary | ICD-10-CM

## 2018-03-01 DIAGNOSIS — Z79899 Other long term (current) drug therapy: Secondary | ICD-10-CM

## 2018-03-01 NOTE — Progress Notes (Signed)
Location:  [facility] Room Number: 219-A Place of Service:  SNF (31) Provider:  Kenard Gower, NP  Patient Care Team: Otila Back as PCP - General (Internal Medicine)  Extended Emergency Contact Information Primary Emergency Contact: Larkin, Alfred Mobile Phone: [phone] Relation: Sister Secondary Emergency Contact: [facility] Address: [address]          [place],  [address] Home Phone: [phone] Relation: None  Code Status:  DNR  Goals of care: Advanced Directive information Advanced Directives 02/22/2018  Does Patient Have a Medical Advance Directive? Yes  Type of Advance Directive Out of facility DNR (pink MOST or yellow form)  Does patient want to make changes to medical advance directive? No - Patient declined  Would patient like information on creating a medical advance directive? -     Chief Complaint  Patient presents with  . Acute Visit    Patient is seen status post fall with forehead hematoma    HPI:  Pt is a 72 y.o. female seen today for an acute visit, status post fall in which she sustained a hematoma of the forehead. She was seen in the room and denied body pain. No skin tear has been noted. She is a short-term rehabilitation resident of [facility].  She has a PMH of dementia, hypertension, PVD, and stroke.    Past Medical History:  Diagnosis Date  . Cerebral infarction Feb 2019   . Dementia   . Hypertension   . Peripheral vascular disease (HCC)   . Stroke [facility])    History reviewed. No pertinent surgical history.  No Known Allergies  Outpatient Encounter Medications as of 03/01/2018  Medication Sig  . acetaminophen (TYLENOL) 325 MG tablet Take 650 mg by mouth every 6 (six) hours as needed.  Marland Kitchen amLODipine (NORVASC) 10 MG tablet Take 10 mg by mouth daily.  Marland Kitchen aspirin EC 81 MG tablet Take 81 mg by mouth daily.  Marland Kitchen atorvastatin (LIPITOR) 20 MG tablet Take 20 mg by mouth daily.  .  cholecalciferol (VITAMIN D) 1000 units tablet Take 2,000 Units by mouth daily. Take 2 tablets to = 2000 units  . clopidogrel (PLAVIX) 75 MG tablet Take 75 mg by mouth daily.  . cyanocobalamin (,VITAMIN B-12,) 1000 MCG/ML injection Inject 1,000 mcg into the muscle every 30 (thirty) days. On or about the 12th of each month  . donepezil (ARICEPT) 10 MG tablet Take 10 mg by mouth at bedtime.  . hydrALAZINE (APRESOLINE) 50 MG tablet Take 1 tablet (50 mg total) by mouth every 8 (eight) hours.  . NUTRITIONAL SUPPLEMENT LIQD Take 120 mLs by mouth 2 (two) times daily. MedPass  . Nutritional Supplements (NUTRITIONAL SUPPLEMENT PO) Take 1 each by mouth 2 (two) times daily. Magic Cup   No facility-administered encounter medications on file as of 03/01/2018.     Review of Systems  Unable to obtain due to dementia.    Immunization History  Administered Date(s) Administered  . Influenza-Unspecified 08/23/2017   Pertinent  Health Maintenance Due  Topic Date Due  . MAMMOGRAM  05/02/1996  . DEXA SCAN  05/03/2011  . PNA vac Low Risk Adult (1 of 2 - PCV13) 05/03/2011  . COLONOSCOPY  09/07/2017  . INFLUENZA VACCINE  06/23/2018      Vitals:   03/01/18 0856  BP: 118/69  Pulse: 76  Resp: 18  Temp: (!) 97.3 F (36.3 C)  TempSrc: Oral  Weight: 154 lb (69.9 kg)  Height: 5\' 6"  (1.676 m)  Body mass index is 24.86 kg/m.  Physical Exam  GENERAL APPEARANCE: Well nourished. In no acute distress. Normal body habitus MOUTH and THROAT: Lips are without lesions. Oral mucosa is moist and without lesions. Tongue is normal in shape, size, and color and without lesions RESPIRATORY: Breathing is even & unlabored, BS CTAB CARDIAC: RRR, no murmur,no extra heart sounds, no edema GI: Abdomen soft, normal BS, no masses, no tenderness EXTREMITIES:  No edema PSYCHIATRIC: Alert to self, disoriented to time and place. Affect and behavior are appropriate   Labs reviewed: Recent Labs    01/23/18 1041  01/24/18 0619 01/25/18 0722 02/02/18  NA 146* 147* 144 142  K 3.0* 3.1* 3.7 3.8  CL 112* 114* 113*  --   CO2 22 23 21*  --   GLUCOSE 129* 132* 113*  --   BUN 18 16 10 15   CREATININE 0.91 1.08* 0.83 0.7  CALCIUM 8.6* 8.7* 8.7*  --   MG  --   --  2.3  --    Recent Labs    01/21/18 1510 01/22/18 0345  AST 38 36  ALT 48 44  ALKPHOS 82 74  BILITOT 1.1 1.4*  PROT 6.7 6.0*  ALBUMIN 3.5 3.1*   Recent Labs    01/21/18 1510 01/22/18 0345 02/02/18  WBC 10.2 8.2 8.3  NEUTROABS 6.7  --  5  HGB 16.1* 14.7 14.7  HCT 52.8* 48.8* 46  MCV 95.0 96.1  --   PLT 189 138* 231    Lab Results  Component Value Date   CHOL 138 02/02/2018   HDL 48 02/02/2018   LDLCALC 73 02/02/2018   TRIG 87 02/02/2018    Assessment/Plan  1. Fall, sequela - S/P Fall , no apparent injury, needs monitoring closely  2. Medication management - reviewed medications, medications are appropriate    Family/ staff Communication: Discussed plan of care with resident and charge nurse.  Labs/tests ordered:  None  Goals of care: Long-term care   Kenard GowerMonina Medina-Vargas, NP [facility] [phone] (Monday-Friday 8:00 a.m. - 5:00 p.m.) [phone] (after hours)

## 2018-03-07 DIAGNOSIS — L89312 Pressure ulcer of right buttock, stage 2: Secondary | ICD-10-CM | POA: Diagnosis not present

## 2018-03-07 DIAGNOSIS — L89621 Pressure ulcer of left heel, stage 1: Secondary | ICD-10-CM | POA: Diagnosis not present

## 2018-03-07 DIAGNOSIS — L89322 Pressure ulcer of left buttock, stage 2: Secondary | ICD-10-CM | POA: Diagnosis not present

## 2018-03-07 DIAGNOSIS — L89611 Pressure ulcer of right heel, stage 1: Secondary | ICD-10-CM | POA: Diagnosis not present

## 2018-03-14 DIAGNOSIS — L89621 Pressure ulcer of left heel, stage 1: Secondary | ICD-10-CM | POA: Diagnosis not present

## 2018-03-14 DIAGNOSIS — L89322 Pressure ulcer of left buttock, stage 2: Secondary | ICD-10-CM | POA: Diagnosis not present

## 2018-03-14 DIAGNOSIS — L89312 Pressure ulcer of right buttock, stage 2: Secondary | ICD-10-CM | POA: Diagnosis not present

## 2018-03-14 DIAGNOSIS — L89611 Pressure ulcer of right heel, stage 1: Secondary | ICD-10-CM | POA: Diagnosis not present

## 2018-03-20 ENCOUNTER — Encounter: Payer: Self-pay | Admitting: Internal Medicine

## 2018-03-20 DIAGNOSIS — F329 Major depressive disorder, single episode, unspecified: Secondary | ICD-10-CM | POA: Insufficient documentation

## 2018-03-20 DIAGNOSIS — F32A Depression, unspecified: Secondary | ICD-10-CM | POA: Insufficient documentation

## 2018-03-21 DIAGNOSIS — L89312 Pressure ulcer of right buttock, stage 2: Secondary | ICD-10-CM | POA: Diagnosis not present

## 2018-03-21 DIAGNOSIS — L89322 Pressure ulcer of left buttock, stage 2: Secondary | ICD-10-CM | POA: Diagnosis not present

## 2018-03-21 DIAGNOSIS — L89621 Pressure ulcer of left heel, stage 1: Secondary | ICD-10-CM | POA: Diagnosis not present

## 2018-03-21 DIAGNOSIS — L89611 Pressure ulcer of right heel, stage 1: Secondary | ICD-10-CM | POA: Diagnosis not present

## 2018-03-28 ENCOUNTER — Non-Acute Institutional Stay (SKILLED_NURSING_FACILITY): Payer: Medicare Other | Admitting: Adult Health

## 2018-03-28 ENCOUNTER — Encounter: Payer: Self-pay | Admitting: Adult Health

## 2018-03-28 DIAGNOSIS — L89611 Pressure ulcer of right heel, stage 1: Secondary | ICD-10-CM | POA: Diagnosis not present

## 2018-03-28 DIAGNOSIS — F329 Major depressive disorder, single episode, unspecified: Secondary | ICD-10-CM

## 2018-03-28 DIAGNOSIS — Z8673 Personal history of transient ischemic attack (TIA), and cerebral infarction without residual deficits: Secondary | ICD-10-CM | POA: Diagnosis not present

## 2018-03-28 DIAGNOSIS — I10 Essential (primary) hypertension: Secondary | ICD-10-CM | POA: Diagnosis not present

## 2018-03-28 DIAGNOSIS — F0150 Vascular dementia without behavioral disturbance: Secondary | ICD-10-CM

## 2018-03-28 DIAGNOSIS — L89621 Pressure ulcer of left heel, stage 1: Secondary | ICD-10-CM | POA: Diagnosis not present

## 2018-03-28 DIAGNOSIS — F32A Depression, unspecified: Secondary | ICD-10-CM

## 2018-03-28 DIAGNOSIS — L89322 Pressure ulcer of left buttock, stage 2: Secondary | ICD-10-CM | POA: Diagnosis not present

## 2018-03-28 DIAGNOSIS — L89312 Pressure ulcer of right buttock, stage 2: Secondary | ICD-10-CM | POA: Diagnosis not present

## 2018-03-28 NOTE — Progress Notes (Signed)
Location:  [facility] Room Number: 201-B Place of Service:  SNF (31) Provider:  Kenard Gower, NP  Patient Care Team: Otila Back as PCP - General (Internal Medicine)  Extended Emergency Contact Information Primary Emergency Contact: Vassie, Kugel Mobile Phone: [phone] Relation: Sister Secondary Emergency Contact: [facility] Address: [address]          [place],  [address] Home Phone: [phone] Relation: None  Code Status:  DNR  Goals of care: Advanced Directive information Advanced Directives 03/28/2018  Does Patient Have a Medical Advance Directive? Yes  Type of Advance Directive Out of facility DNR (pink MOST or yellow form)  Does patient want to make changes to medical advance directive? No - Patient declined  Would patient like information on creating a medical advance directive? -     Chief Complaint  Patient presents with  . Medical Management of Chronic Issues    Routine [facility] visit    HPI:  Pt is a 72 y.o. female seen today for medical management of chronic diseases.  She is a long-term care resident of [facility].  She has a PMH of dementia, hypertension, peripheral vascular disease, and stroke.   Past Medical History:  Diagnosis Date  . Cerebral infarction Feb 2019   . Dementia   . Hypertension   . Peripheral vascular disease (HCC)   . Stroke [facility])    History reviewed. No pertinent surgical history.  No Known Allergies  Outpatient Encounter Medications as of 03/28/2018  Medication Sig  . acetaminophen (TYLENOL) 325 MG tablet Take 650 mg by mouth every 6 (six) hours as needed.  . Amino Acids-Protein Hydrolys (FEEDING SUPPLEMENT, PRO-STAT SUGAR FREE 64,) LIQD Take 30 mLs by mouth daily.  Marland Kitchen amLODipine (NORVASC) 10 MG tablet Take 10 mg by mouth daily.  Marland Kitchen aspirin EC 81 MG tablet Take 81 mg by mouth daily.  Marland Kitchen atorvastatin (LIPITOR) 20 MG tablet Take 20 mg by mouth daily.  .  cholecalciferol (VITAMIN D) 1000 units tablet Take 2,000 Units by mouth daily. Take 2 tablets to = 2000 units  . clopidogrel (PLAVIX) 75 MG tablet Take 75 mg by mouth daily.  . cyanocobalamin (,VITAMIN B-12,) 1000 MCG/ML injection Inject 1,000 mcg into the muscle every 30 (thirty) days. On or about the 12th of each month  . donepezil (ARICEPT) 10 MG tablet Take 10 mg by mouth at bedtime.  . hydrALAZINE (APRESOLINE) 50 MG tablet Take 1 tablet (50 mg total) by mouth every 8 (eight) hours.  . mirtazapine (REMERON) 15 MG tablet Take 15 mg by mouth at bedtime.  . Multiple Vitamins-Minerals (MULTIVITAMIN WITH MINERALS) tablet Take 1 tablet by mouth daily.  . Nutritional Supplements (NUTRITIONAL SUPPLEMENT PO) Take 1 each by mouth 2 (two) times daily. Magic Cup  . [DISCONTINUED] NUTRITIONAL SUPPLEMENT LIQD Take 120 mLs by mouth 2 (two) times daily. MedPass   No facility-administered encounter medications on file as of 03/28/2018.     Review of Systems  Unable to obtain due to dementia    Immunization History  Administered Date(s) Administered  . Influenza-Unspecified 08/23/2017   Pertinent  Health Maintenance Due  Topic Date Due  . MAMMOGRAM  05/02/1996  . DEXA SCAN  05/03/2011  . PNA vac Low Risk Adult (1 of 2 - PCV13) 05/03/2011  . COLONOSCOPY  09/07/2017  . INFLUENZA VACCINE  06/23/2018      Vitals:   03/28/18 1242  BP: 117/70  Pulse: 63  Resp: 20  Temp: Marland Kitchen)  97.5 F (36.4 C)  TempSrc: Oral  SpO2: 93%  Weight: 152 lb 9.6 oz (69.2 kg)  Height:  (1.676 m)   Body mass index is 24.63 kg/m.  Physical Exam  GENERAL APPEARANCE: Well nourished. In no acute distress. Normal body habitus SKIN:  Has pressure ulcer on Left and Right inner buttock with dressing, has bilateral heel unstageable pressure ulcer MOUTH and THROAT: Lips are without lesions. Oral mucosa is moist and without lesions. Tongue is normal in shape, size, and color and without lesions RESPIRATORY: Breathing is  even & unlabored, BS CTAB CARDIAC: RRR, no murmur,no extra heart sounds, no edema EXTREMITIES: Able to move BUE and RLE, can't move LLE PSYCHIATRIC: Alert to self, disoriented to time and place. Affect and behavior are appropriate   Labs reviewed: Recent Labs    01/23/18 1041 01/24/18 0619 01/25/18 0722  02/22/18 02/23/18 02/28/18  NA 146* 147* 144   < > 150* 149* 145  K 3.0* 3.1* 3.7   < > 4.0 3.3* 3.6  CL 112* 114* 113*  --   --   --   --   CO2 22 23 21*  --   --   --   --   GLUCOSE 129* 132* 113*  --   --   --   --   BUN < > CREATININE 0.91 1.08* 0.83   < > 0.6 0.7 0.6  CALCIUM 8.6* 8.7* 8.7*  --   --   --   --   MG  --   --  2.3  --   --   --   --    < > = values in this interval not displayed.   Recent Labs    01/21/18 1510 01/22/18 0345  AST 38 36  ALT 48 44  ALKPHOS 82 74  BILITOT 1.1 1.4*  PROT 6.7 6.0*  ALBUMIN 3.5 3.1*   Recent Labs    01/21/18 1510 01/22/18 0345 02/02/18 02/22/18  WBC 10.2 8.2 8.3 9.8  NEUTROABS 6.7  --  5 6  HGB 16.1* 14.7 14.7 15.8  HCT 52.8* 48.8* 46 48*  MCV 95.0 96.1  --   --   PLT 189 138* 231 277    Lab Results  Component Value Date   CHOL 138 02/02/2018   HDL 48 02/02/2018   LDLCALC 73 02/02/2018   TRIG 87 02/02/2018     Assessment/Plan  .1. Essential hypertension - well-controlled, continue Hydralazine 50 mg Q 8 hours, Amlodipine 10 mg daily   2. History of CVA (cerebrovascular accident) Feb 2019 - stable, continue Hydralazine 50 mg Q 8 hours, Amlodipine 10 mg daily, Atorvastatin 20 mg daily, Plavix 75 mg daily    3. Chronic depression - mood is stable, continue Mirtazapine 15 mg daily   4. Vascular dementia without behavioral disturbance - continue Donepezil 10 mg Q HS and supportive care    Family/ staff Communication: Discussed plan of care with resident  Labs/tests ordered:  None  Goals of care:   Long-term care   Kenard Gower, NP [facility] [phone] (Monday-Friday 8:00 a.m. - 5:00 p.m.) [phone] (after hours)

## 2018-04-04 DIAGNOSIS — L89312 Pressure ulcer of right buttock, stage 2: Secondary | ICD-10-CM | POA: Diagnosis not present

## 2018-04-04 DIAGNOSIS — L89322 Pressure ulcer of left buttock, stage 2: Secondary | ICD-10-CM | POA: Diagnosis not present

## 2018-04-04 DIAGNOSIS — L89621 Pressure ulcer of left heel, stage 1: Secondary | ICD-10-CM | POA: Diagnosis not present

## 2018-04-04 DIAGNOSIS — L89611 Pressure ulcer of right heel, stage 1: Secondary | ICD-10-CM | POA: Diagnosis not present

## 2018-04-05 DIAGNOSIS — F0390 Unspecified dementia without behavioral disturbance: Secondary | ICD-10-CM | POA: Diagnosis not present

## 2018-04-05 DIAGNOSIS — R630 Anorexia: Secondary | ICD-10-CM | POA: Diagnosis not present

## 2018-04-05 DIAGNOSIS — F4321 Adjustment disorder with depressed mood: Secondary | ICD-10-CM | POA: Diagnosis not present

## 2018-04-11 DIAGNOSIS — L89322 Pressure ulcer of left buttock, stage 2: Secondary | ICD-10-CM | POA: Diagnosis not present

## 2018-04-11 DIAGNOSIS — L89621 Pressure ulcer of left heel, stage 1: Secondary | ICD-10-CM | POA: Diagnosis not present

## 2018-04-11 DIAGNOSIS — L89611 Pressure ulcer of right heel, stage 1: Secondary | ICD-10-CM | POA: Diagnosis not present

## 2018-04-11 DIAGNOSIS — L89312 Pressure ulcer of right buttock, stage 2: Secondary | ICD-10-CM | POA: Diagnosis not present

## 2018-04-12 DIAGNOSIS — F0390 Unspecified dementia without behavioral disturbance: Secondary | ICD-10-CM | POA: Diagnosis not present

## 2018-04-12 DIAGNOSIS — F4321 Adjustment disorder with depressed mood: Secondary | ICD-10-CM | POA: Diagnosis not present

## 2018-04-19 DIAGNOSIS — N179 Acute kidney failure, unspecified: Secondary | ICD-10-CM | POA: Diagnosis not present

## 2018-04-19 DIAGNOSIS — M6281 Muscle weakness (generalized): Secondary | ICD-10-CM | POA: Diagnosis not present

## 2018-04-25 DIAGNOSIS — L89621 Pressure ulcer of left heel, stage 1: Secondary | ICD-10-CM | POA: Diagnosis not present

## 2018-04-25 DIAGNOSIS — L89611 Pressure ulcer of right heel, stage 1: Secondary | ICD-10-CM | POA: Diagnosis not present

## 2018-04-25 DIAGNOSIS — L89322 Pressure ulcer of left buttock, stage 2: Secondary | ICD-10-CM | POA: Diagnosis not present

## 2018-04-25 DIAGNOSIS — L89312 Pressure ulcer of right buttock, stage 2: Secondary | ICD-10-CM | POA: Diagnosis not present

## 2018-04-26 DIAGNOSIS — F4321 Adjustment disorder with depressed mood: Secondary | ICD-10-CM | POA: Diagnosis not present

## 2018-04-26 DIAGNOSIS — G4700 Insomnia, unspecified: Secondary | ICD-10-CM | POA: Diagnosis not present

## 2018-04-26 DIAGNOSIS — F0390 Unspecified dementia without behavioral disturbance: Secondary | ICD-10-CM | POA: Diagnosis not present

## 2018-04-29 DIAGNOSIS — R630 Anorexia: Secondary | ICD-10-CM | POA: Diagnosis not present

## 2018-04-29 DIAGNOSIS — M6281 Muscle weakness (generalized): Secondary | ICD-10-CM | POA: Diagnosis not present

## 2018-04-29 DIAGNOSIS — D649 Anemia, unspecified: Secondary | ICD-10-CM | POA: Diagnosis not present

## 2018-04-29 DIAGNOSIS — Z79899 Other long term (current) drug therapy: Secondary | ICD-10-CM | POA: Diagnosis not present

## 2018-04-29 LAB — BASIC METABOLIC PANEL
BUN: 18 (ref 4–21)
Creatinine: 0.5 (ref 0.5–1.1)
Glucose: 96
Potassium: 3.7 (ref 3.4–5.3)
Sodium: 149 — AB (ref 137–147)

## 2018-04-29 LAB — CBC AND DIFFERENTIAL
HEMATOCRIT: 45 (ref 36–46)
HEMOGLOBIN: 14.4 (ref 12.0–16.0)
NEUTROS ABS: 5
PLATELETS: 231 (ref 150–399)
WBC: 7.2

## 2018-05-01 ENCOUNTER — Emergency Department (HOSPITAL_COMMUNITY): Payer: Medicare Other

## 2018-05-01 ENCOUNTER — Inpatient Hospital Stay (HOSPITAL_COMMUNITY)
Admission: EM | Admit: 2018-05-01 | Discharge: 2018-05-06 | DRG: 064 | Disposition: A | Discharge status: Skilled Nursing Facility | Payer: Medicare Other | Source: Skilled Nursing Facility | Attending: Family Medicine | Admitting: Family Medicine

## 2018-05-01 ENCOUNTER — Other Ambulatory Visit: Payer: Self-pay

## 2018-05-01 ENCOUNTER — Encounter (HOSPITAL_COMMUNITY): Payer: Self-pay

## 2018-05-01 DIAGNOSIS — B9620 Unspecified Escherichia coli [E. coli] as the cause of diseases classified elsewhere: Secondary | ICD-10-CM | POA: Diagnosis present

## 2018-05-01 DIAGNOSIS — R079 Chest pain, unspecified: Secondary | ICD-10-CM | POA: Diagnosis not present

## 2018-05-01 DIAGNOSIS — F329 Major depressive disorder, single episode, unspecified: Secondary | ICD-10-CM | POA: Diagnosis present

## 2018-05-01 DIAGNOSIS — R29810 Facial weakness: Secondary | ICD-10-CM | POA: Diagnosis not present

## 2018-05-01 DIAGNOSIS — G8191 Hemiplegia, unspecified affecting right dominant side: Secondary | ICD-10-CM | POA: Diagnosis not present

## 2018-05-01 DIAGNOSIS — I69354 Hemiplegia and hemiparesis following cerebral infarction affecting left non-dominant side: Secondary | ICD-10-CM

## 2018-05-01 DIAGNOSIS — Z7982 Long term (current) use of aspirin: Secondary | ICD-10-CM

## 2018-05-01 DIAGNOSIS — Z8673 Personal history of transient ischemic attack (TIA), and cerebral infarction without residual deficits: Secondary | ICD-10-CM

## 2018-05-01 DIAGNOSIS — F0390 Unspecified dementia without behavioral disturbance: Secondary | ICD-10-CM | POA: Diagnosis present

## 2018-05-01 DIAGNOSIS — R531 Weakness: Secondary | ICD-10-CM | POA: Diagnosis not present

## 2018-05-01 DIAGNOSIS — I340 Nonrheumatic mitral (valve) insufficiency: Secondary | ICD-10-CM | POA: Diagnosis not present

## 2018-05-01 DIAGNOSIS — Z66 Do not resuscitate: Secondary | ICD-10-CM | POA: Diagnosis present

## 2018-05-01 DIAGNOSIS — I2510 Atherosclerotic heart disease of native coronary artery without angina pectoris: Secondary | ICD-10-CM | POA: Diagnosis present

## 2018-05-01 DIAGNOSIS — I6381 Other cerebral infarction due to occlusion or stenosis of small artery: Principal | ICD-10-CM | POA: Diagnosis present

## 2018-05-01 DIAGNOSIS — I69320 Aphasia following cerebral infarction: Secondary | ICD-10-CM | POA: Diagnosis not present

## 2018-05-01 DIAGNOSIS — I25119 Atherosclerotic heart disease of native coronary artery with unspecified angina pectoris: Secondary | ICD-10-CM

## 2018-05-01 DIAGNOSIS — J9811 Atelectasis: Secondary | ICD-10-CM | POA: Diagnosis not present

## 2018-05-01 DIAGNOSIS — Z8249 Family history of ischemic heart disease and other diseases of the circulatory system: Secondary | ICD-10-CM

## 2018-05-01 DIAGNOSIS — I6930 Unspecified sequelae of cerebral infarction: Secondary | ICD-10-CM

## 2018-05-01 DIAGNOSIS — Z7401 Bed confinement status: Secondary | ICD-10-CM

## 2018-05-01 DIAGNOSIS — R1310 Dysphagia, unspecified: Secondary | ICD-10-CM

## 2018-05-01 DIAGNOSIS — Z9049 Acquired absence of other specified parts of digestive tract: Secondary | ICD-10-CM

## 2018-05-01 DIAGNOSIS — J40 Bronchitis, not specified as acute or chronic: Secondary | ICD-10-CM | POA: Diagnosis not present

## 2018-05-01 DIAGNOSIS — I10 Essential (primary) hypertension: Secondary | ICD-10-CM | POA: Diagnosis present

## 2018-05-01 DIAGNOSIS — R0789 Other chest pain: Secondary | ICD-10-CM | POA: Diagnosis present

## 2018-05-01 DIAGNOSIS — E870 Hyperosmolality and hypernatremia: Secondary | ICD-10-CM | POA: Diagnosis not present

## 2018-05-01 DIAGNOSIS — Z7902 Long term (current) use of antithrombotics/antiplatelets: Secondary | ICD-10-CM

## 2018-05-01 DIAGNOSIS — R001 Bradycardia, unspecified: Secondary | ICD-10-CM | POA: Diagnosis not present

## 2018-05-01 DIAGNOSIS — E785 Hyperlipidemia, unspecified: Secondary | ICD-10-CM | POA: Diagnosis not present

## 2018-05-01 DIAGNOSIS — Z95828 Presence of other vascular implants and grafts: Secondary | ICD-10-CM

## 2018-05-01 DIAGNOSIS — L89133 Pressure ulcer of right lower back, stage 3: Secondary | ICD-10-CM | POA: Diagnosis not present

## 2018-05-01 DIAGNOSIS — Z515 Encounter for palliative care: Secondary | ICD-10-CM

## 2018-05-01 DIAGNOSIS — F0151 Vascular dementia with behavioral disturbance: Secondary | ICD-10-CM | POA: Diagnosis not present

## 2018-05-01 DIAGNOSIS — F0391 Unspecified dementia with behavioral disturbance: Secondary | ICD-10-CM | POA: Diagnosis present

## 2018-05-01 DIAGNOSIS — R9431 Abnormal electrocardiogram [ECG] [EKG]: Secondary | ICD-10-CM | POA: Diagnosis not present

## 2018-05-01 DIAGNOSIS — N3000 Acute cystitis without hematuria: Secondary | ICD-10-CM | POA: Diagnosis present

## 2018-05-01 DIAGNOSIS — E876 Hypokalemia: Secondary | ICD-10-CM | POA: Diagnosis present

## 2018-05-01 DIAGNOSIS — M2550 Pain in unspecified joint: Secondary | ICD-10-CM | POA: Diagnosis not present

## 2018-05-01 DIAGNOSIS — E871 Hypo-osmolality and hyponatremia: Secondary | ICD-10-CM | POA: Diagnosis not present

## 2018-05-01 DIAGNOSIS — L89620 Pressure ulcer of left heel, unstageable: Secondary | ICD-10-CM | POA: Diagnosis present

## 2018-05-01 DIAGNOSIS — R1311 Dysphagia, oral phase: Secondary | ICD-10-CM | POA: Diagnosis present

## 2018-05-01 DIAGNOSIS — E860 Dehydration: Secondary | ICD-10-CM | POA: Diagnosis present

## 2018-05-01 DIAGNOSIS — I69321 Dysphasia following cerebral infarction: Secondary | ICD-10-CM

## 2018-05-01 DIAGNOSIS — I739 Peripheral vascular disease, unspecified: Secondary | ICD-10-CM

## 2018-05-01 DIAGNOSIS — I6389 Other cerebral infarction: Secondary | ICD-10-CM | POA: Diagnosis not present

## 2018-05-01 DIAGNOSIS — I639 Cerebral infarction, unspecified: Secondary | ICD-10-CM | POA: Diagnosis not present

## 2018-05-01 DIAGNOSIS — Z79899 Other long term (current) drug therapy: Secondary | ICD-10-CM

## 2018-05-01 DIAGNOSIS — R52 Pain, unspecified: Secondary | ICD-10-CM | POA: Diagnosis not present

## 2018-05-01 HISTORY — DX: Hyperlipidemia, unspecified: E78.5

## 2018-05-01 LAB — URINALYSIS, ROUTINE W REFLEX MICROSCOPIC
Bilirubin Urine: NEGATIVE
Glucose, UA: NEGATIVE mg/dL
Ketones, ur: 20 mg/dL — AB
NITRITE: POSITIVE — AB
Protein, ur: NEGATIVE mg/dL
SPECIFIC GRAVITY, URINE: 1.023 (ref 1.005–1.030)
pH: 5 (ref 5.0–8.0)

## 2018-05-01 LAB — DIFFERENTIAL
ABS IMMATURE GRANULOCYTES: 0 10*3/uL (ref 0.0–0.1)
BASOS ABS: 0.1 10*3/uL (ref 0.0–0.1)
Basophils Relative: 1 %
Eosinophils Absolute: 0.2 10*3/uL (ref 0.0–0.7)
Eosinophils Relative: 2 %
IMMATURE GRANULOCYTES: 0 %
LYMPHS ABS: 2.2 10*3/uL (ref 0.7–4.0)
LYMPHS PCT: 30 %
Monocytes Absolute: 0.4 10*3/uL (ref 0.1–1.0)
Monocytes Relative: 5 %
NEUTROS ABS: 4.6 10*3/uL (ref 1.7–7.7)
Neutrophils Relative %: 62 %

## 2018-05-01 LAB — ETHANOL: Alcohol, Ethyl (B): <10 mg/dL (ref <10)

## 2018-05-01 LAB — COMPREHENSIVE METABOLIC PANEL
ALBUMIN: 3.4 g/dL — AB (ref 3.5–5.0)
ALT: 19 U/L (ref 14–54)
AST: 23 U/L (ref 15–41)
Alkaline Phosphatase: 69 U/L (ref 38–126)
Anion gap: 11 (ref 5–15)
BUN: 16 mg/dL (ref 6–20)
CALCIUM: 9.4 mg/dL (ref 8.9–10.3)
CO2: 25 mmol/L (ref 22–32)
CREATININE: 0.78 mg/dL (ref 0.44–1.00)
Chloride: 115 mmol/L — ABNORMAL HIGH (ref 101–111)
GFR calc Af Amer: >60 mL/min (ref >60)
GFR calc non Af Amer: >60 mL/min (ref >60)
GLUCOSE: 105 mg/dL — AB (ref 65–99)
Potassium: 3.9 mmol/L (ref 3.5–5.1)
SODIUM: 151 mmol/L — AB (ref 135–145)
Total Bilirubin: 1.2 mg/dL (ref 0.3–1.2)
Total Protein: 6.6 g/dL (ref 6.5–8.1)

## 2018-05-01 LAB — CBC
HCT: 51.6 % — ABNORMAL HIGH (ref 36.0–46.0)
HEMOGLOBIN: 15.9 g/dL — AB (ref 12.0–15.0)
MCH: 28.6 pg (ref 26.0–34.0)
MCHC: 30.8 g/dL (ref 30.0–36.0)
MCV: 92.8 fL (ref 78.0–100.0)
Platelets: 248 10*3/uL (ref 150–400)
RBC: 5.56 MIL/uL — ABNORMAL HIGH (ref 3.87–5.11)
RDW: 15.2 % (ref 11.5–15.5)
WBC: 7.4 10*3/uL (ref 4.0–10.5)

## 2018-05-01 LAB — I-STAT CHEM 8, ED
BUN: 18 mg/dL (ref 6–20)
CALCIUM ION: 1.13 mmol/L — AB (ref 1.15–1.40)
CHLORIDE: 113 mmol/L — AB (ref 101–111)
CREATININE: 0.6 mg/dL (ref 0.44–1.00)
Glucose, Bld: 104 mg/dL — ABNORMAL HIGH (ref 65–99)
HCT: 49 % — ABNORMAL HIGH (ref 36.0–46.0)
Hemoglobin: 16.7 g/dL — ABNORMAL HIGH (ref 12.0–15.0)
POTASSIUM: 3.8 mmol/L (ref 3.5–5.1)
Sodium: 152 mmol/L — ABNORMAL HIGH (ref 135–145)
TCO2: 24 mmol/L (ref 22–32)

## 2018-05-01 LAB — TROPONIN I: Troponin I: <0.03 ng/mL (ref <0.03)

## 2018-05-01 LAB — RAPID URINE DRUG SCREEN, HOSP PERFORMED
AMPHETAMINES: NOT DETECTED
BARBITURATES: NOT DETECTED
Benzodiazepines: NOT DETECTED
Cocaine: NOT DETECTED
OPIATES: NOT DETECTED
TETRAHYDROCANNABINOL: NOT DETECTED

## 2018-05-01 LAB — APTT: APTT: 28 s (ref 24–36)

## 2018-05-01 LAB — PROTIME-INR
INR: 1.13
Prothrombin Time: 14.4 seconds (ref 11.4–15.2)

## 2018-05-01 LAB — CBG MONITORING, ED: GLUCOSE-CAPILLARY: 94 mg/dL (ref 65–99)

## 2018-05-01 LAB — I-STAT TROPONIN, ED: Troponin i, poc: 0 ng/mL (ref 0.00–0.08)

## 2018-05-01 MED ORDER — ATORVASTATIN CALCIUM 20 MG PO TABS
20.0000 mg | ORAL_TABLET | Freq: Every day | ORAL | Status: DC
  Administered 2018-05-02 – 2018-05-05 (×3): 20 mg via ORAL
  Filled 2018-05-01 (×3): qty 1

## 2018-05-01 MED ORDER — SODIUM CHLORIDE 0.9 % IV SOLN
1.0000 g | Freq: Once | INTRAVENOUS | Status: AC
  Administered 2018-05-01: 1 g via INTRAVENOUS
  Filled 2018-05-01: qty 10

## 2018-05-01 MED ORDER — ASPIRIN 325 MG PO TABS
325.0000 mg | ORAL_TABLET | Freq: Every day | ORAL | Status: DC
  Administered 2018-05-03 – 2018-05-06 (×2): 325 mg via ORAL
  Filled 2018-05-01 (×2): qty 1

## 2018-05-01 MED ORDER — DEXTROSE 5 % IV SOLN
INTRAVENOUS | Status: DC
  Administered 2018-05-01: 21:00:00 via INTRAVENOUS
  Administered 2018-05-02 – 2018-05-03 (×2): 75 mL/h via INTRAVENOUS
  Administered 2018-05-03 – 2018-05-06 (×5): via INTRAVENOUS

## 2018-05-01 MED ORDER — DONEPEZIL HCL 10 MG PO TABS
10.0000 mg | ORAL_TABLET | Freq: Every day | ORAL | Status: DC
  Administered 2018-05-02 – 2018-05-03 (×2): 10 mg via ORAL
  Filled 2018-05-01 (×4): qty 1

## 2018-05-01 MED ORDER — STROKE: EARLY STAGES OF RECOVERY BOOK
Freq: Once | Status: AC
  Administered 2018-05-02: 18:00:00
  Filled 2018-05-01: qty 1

## 2018-05-01 MED ORDER — ACETAMINOPHEN 325 MG PO TABS: 650.0000 mg | ORAL_TABLET | Freq: Four times a day (QID) | ORAL | Status: DC | PRN

## 2018-05-01 MED ORDER — ONDANSETRON HCL 4 MG/2ML IJ SOLN: 4.0000 mg | Freq: Four times a day (QID) | INTRAMUSCULAR | Status: DC | PRN

## 2018-05-01 MED ORDER — HYDRALAZINE HCL 20 MG/ML IJ SOLN: 10.0000 mg | INTRAMUSCULAR | Status: DC | PRN

## 2018-05-01 MED ORDER — ASPIRIN 300 MG RE SUPP
300.0000 mg | Freq: Every day | RECTAL | Status: DC
  Administered 2018-05-01 – 2018-05-05 (×4): 300 mg via RECTAL
  Filled 2018-05-01 (×5): qty 1

## 2018-05-01 MED ORDER — SODIUM CHLORIDE 0.9 % IV BOLUS
1000.0000 mL | Freq: Once | INTRAVENOUS | Status: AC
Start: 2018-05-01 — End: 2018-05-01
  Administered 2018-05-01: 1000 mL via INTRAVENOUS

## 2018-05-01 MED ORDER — ENOXAPARIN SODIUM 40 MG/0.4ML ~~LOC~~ SOLN
40.0000 mg | SUBCUTANEOUS | Status: DC
  Administered 2018-05-01 – 2018-05-05 (×5): 40 mg via SUBCUTANEOUS
  Filled 2018-05-01 (×3): qty 0.4

## 2018-05-01 MED ORDER — ACETAMINOPHEN 650 MG RE SUPP: 650.0000 mg | Freq: Four times a day (QID) | RECTAL | Status: DC | PRN

## 2018-05-01 MED ORDER — IPRATROPIUM-ALBUTEROL 0.5-2.5 (3) MG/3ML IN SOLN
3.0000 mL | Freq: Four times a day (QID) | RESPIRATORY_TRACT | Status: DC | PRN
Start: 2018-05-01 — End: 2018-05-06

## 2018-05-01 MED ORDER — SODIUM CHLORIDE 0.9% FLUSH
3.0000 mL | Freq: Two times a day (BID) | INTRAVENOUS | Status: DC
  Administered 2018-05-05 – 2018-05-06 (×3): 3 mL via INTRAVENOUS

## 2018-05-01 MED ORDER — SODIUM CHLORIDE 0.9 % IV SOLN
1.0000 g | INTRAVENOUS | Status: DC
  Administered 2018-05-02 – 2018-05-05 (×4): 1 g via INTRAVENOUS
  Filled 2018-05-01 (×4): qty 10

## 2018-05-01 MED ORDER — MIRTAZAPINE 15 MG PO TABS
15.0000 mg | ORAL_TABLET | Freq: Every day | ORAL | Status: DC
  Administered 2018-05-02 – 2018-05-03 (×2): 15 mg via ORAL
  Filled 2018-05-01 (×4): qty 1

## 2018-05-01 MED ORDER — ONDANSETRON HCL 4 MG PO TABS: 4.0000 mg | ORAL_TABLET | Freq: Four times a day (QID) | ORAL | Status: DC | PRN

## 2018-05-01 MED ORDER — AMLODIPINE BESYLATE 10 MG PO TABS
10.0000 mg | ORAL_TABLET | Freq: Every day | ORAL | Status: DC
  Administered 2018-05-03 – 2018-05-06 (×3): 10 mg via ORAL
  Filled 2018-05-01 (×2): qty 2
  Filled 2018-05-01: qty 1
  Filled 2018-05-01: qty 2

## 2018-05-01 MED ORDER — CLOPIDOGREL BISULFATE 75 MG PO TABS
75.0000 mg | ORAL_TABLET | Freq: Every day | ORAL | Status: DC
  Administered 2018-05-03 – 2018-05-06 (×3): 75 mg via ORAL
  Filled 2018-05-01 (×4): qty 1

## 2018-05-01 NOTE — H&P (Addendum)
History and Physical    Morgan Sutton:[id number] DOB: February 10, 1946 DOA: 05/01/2018  Referring MD/NP/PA: Dr. Chaney Malling PCP: Eunice Blase, PA-C  Patient coming from: [facility] nursing facility via EMS  Chief Complaint: Weakness  I have personally briefly reviewed patient's old medical records in [facility]   HPI: Morgan Sutton is a 72 y.o. right-handed female with medical history significant of HTN, HLD, CAD, CVA with residual left-sided weakness, dysphagia on pured diet, dementia, remote history of CAD, PVD with bilateral iliac stents in 2017; who presents from [facility] with complaints of weakness new right-sided.  Patient sister provides history over the phone as the patient has known dementia.  At baseline the patient is nonambulatory, able to communicate , and recognizes her sister.  Patient sister apparently comes to visit her regularly. When she went to see her 3 days ago, she had not felt well.  Today, when she went to see her she reported that she did not look well, and was unable to use her right hand to drink out of her sippy cup.  She reported that her right arm was limp and that she had more strength out of her left hand which was previously affected with previous stroke.  When she asked if she was in pain she said in her chest, but reported that she could not really understand the rest of what she was saying.  When she felt her right arm it felt cold to the touch and her neck was also purple in color. She is at the facility and is on a pured diet and she makes note that they do not keep her tray there long enough for her to stay adequately hydrated or fed herself.  She fears that if she is unable to use her right hand what will do.    She also expresses a significant family history of heart disease on the patient's father's side. The earliest paternal uncle with heart disease was likely in their 41s.  Much of what patient tries to stay is unable to be understood but some words  such as  "yes" or "no" are clearly formed.  At this time she does not complain of chest pain.  ED Course: Upon admission into the urgency department patient was noted to be afebrile with pulse 51-66, respirations 11-17, blood pressure 155/84-190/74, and O2 saturations maintained on room air.  Initial CT scan of brain showed atrophy with chronic changes and bilateral basal ganglia and thalami infarcts with possible progression of right thalami infarct.  Labs revealed WBC 7.7, hemoglobin 15.9, sodium 151, albumin 3.4, and troponin 0.  Urinalysis was positive for signs of infection.  Patient was given 1 L of normal saline IV fluids and Rocephin IV.  TRH called to admit.   Review of Systems  Unable to perform ROS: Dementia    Past Medical History:  Diagnosis Date  . Cerebral infarction Feb 2019   . Dementia   . HLD (hyperlipidemia)   . Hypertension   . Peripheral vascular disease (HCC)   . Stroke [facility])     Past Surgical History:  Procedure Laterality Date  . CHOLECYSTECTOMY    . LEG SURGERY     Per sister patient shattered leg requiring surgery with placement of hardware  . STENT PLACEMENT ILIAC ([facility] HX)      Reports that she has quit smoking. She has never used smokeless tobacco. She reports that she does not drink alcohol or use drugs.  No Known Allergies  Family history significant for heart disease  Prior to Admission medications   Medication Sig Start Date End Date Taking? Authorizing Provider  acetaminophen (TYLENOL) 325 MG tablet Take 650 mg by mouth every 6 (six) hours as needed.    [provider]  Amino Acids-Protein Hydrolys (FEEDING SUPPLEMENT, PRO-STAT SUGAR FREE 64,) LIQD Take 30 mLs by mouth daily.    [provider]  amLODipine (NORVASC) 10 MG tablet Take 10 mg by mouth daily.    [provider]  aspirin EC 81 MG tablet Take 81 mg by mouth daily.    [provider]  atorvastatin (LIPITOR) 20 MG tablet Take 20 mg by mouth daily.     [provider]  cholecalciferol (VITAMIN D) 1000 units tablet Take 2,000 Units by mouth daily. Take 2 tablets to = 2000 units    [provider]  clopidogrel (PLAVIX) 75 MG tablet Take 75 mg by mouth daily.    [provider]  cyanocobalamin (,VITAMIN B-12,) 1000 MCG/ML injection Inject 1,000 mcg into the muscle every 30 (thirty) days. On or about the 12th of each month    [provider]  donepezil (ARICEPT) 10 MG tablet Take 10 mg by mouth at bedtime.    [provider]  hydrALAZINE (APRESOLINE) 50 MG tablet Take 1 tablet (50 mg total) by mouth every 8 (eight) hours. 01/25/18   Leroy Sea, MD  mirtazapine (REMERON) 15 MG tablet Take 15 mg by mouth at bedtime.    [provider]  Multiple Vitamins-Minerals (MULTIVITAMIN WITH MINERALS) tablet Take 1 tablet by mouth daily.    [provider]  Nutritional Supplements (NUTRITIONAL SUPPLEMENT PO) Take 1 each by mouth 2 (two) times daily. Manufacturing engineer, Historical, MD    Physical Exam:  Constitutional: Elderly female who appears to be in some discomfort Vitals:   05/01/18 1711 05/01/18 1715 05/01/18 1730 05/01/18 1830  BP:  (!) 190/74 (!) 175/104 (!) 155/87  Pulse:  (!) 52 66 (!) 51  Resp:  17 16 12   Temp:      TempSrc:      SpO2: 96% 100% 98% 98%   Eyes: PERRL, mild cloudiness noted of the right eye ENMT: Mucous membranes are dry. Posterior pharynx clear of any exudate or lesions.  Neck: normal, supple, no masses, no thyromegaly Respiratory: clear to auscultation bilaterally, no wheezing, no crackles. Normal respiratory effort. No accessory muscle use.  Cardiovascular: Bradycardic, positive systolic murmur.  No rubs / gallops. No extremity edema. 2+ pedal pulses. No carotid bruits.  Abdomen: no tenderness, no masses palpated. No hepatosplenomegaly. Bowel sounds positive.  Musculoskeletal: no clubbing / cyanosis. No joint deformity upper and lower extremities. Good  ROM, no contractures. Normal muscle tone.  Skin: Poor skin turgor noted.  Mild redness noted of the neck  Neurologic: Patient able to move all extremities, but strength appears to be 3/5 in the right upper and lower extremity.  4 out of 5 of the left upper and lower extremity.  Mild facial droop possibly noted on the right. Psychiatric: Alert, but unable to assess orientation. Normal mood.     Labs on Admission: I have personally reviewed following labs and imaging studies  CBC: Recent Labs  Lab 05/01/18 1743 05/01/18 1751  WBC 7.4  --   NEUTROABS 4.6  --   HGB 15.9* 16.7*  HCT 51.6* 49.0*  MCV 92.8  --   PLT 248  --    Basic Metabolic Panel: Recent  Labs  Lab 05/01/18 1743 05/01/18 1751  NA 151* 152*  K 3.9 3.8  CL 115* 113*  CO2 25  --   GLUCOSE 105* 104*  BUN 16 18  CREATININE 0.78 0.60  CALCIUM 9.4  --    GFR: CrCl cannot be calculated (Unknown ideal weight.). Liver Function Tests: Recent Labs  Lab 05/01/18 1743  AST 23  ALT 19  ALKPHOS 69  BILITOT 1.2  PROT 6.6  ALBUMIN 3.4*   No results for input(s): LIPASE, AMYLASE in the last 168 hours. No results for input(s): AMMONIA in the last 168 hours. Coagulation Profile: Recent Labs  Lab 05/01/18 1743  INR 1.13   Cardiac Enzymes: No results for input(s): CKTOTAL, CKMB, CKMBINDEX, TROPONINI in the last 168 hours. BNP (last 3 results) No results for input(s): PROBNP in the last 8760 hours. HbA1C: No results for input(s): HGBA1C in the last 72 hours. CBG: Recent Labs  Lab 05/01/18 1742  GLUCAP 94   Lipid Profile: No results for input(s): CHOL, HDL, LDLCALC, TRIG, CHOLHDL, LDLDIRECT in the last 72 hours. Thyroid Function Tests: No results for input(s): TSH, T4TOTAL, FREET4, T3FREE, THYROIDAB in the last 72 hours. Anemia Panel: No results for input(s): VITAMINB12, FOLATE, FERRITIN, TIBC, IRON, RETICCTPCT in the last 72 hours. Urine analysis:    Component Value Date/Time   COLORURINE AMBER (A)  05/01/2018 1748   APPEARANCEUR CLOUDY (A) 05/01/2018 1748   LABSPEC 1.023 05/01/2018 1748   PHURINE 5.0 05/01/2018 1748   GLUCOSEU NEGATIVE 05/01/2018 1748   HGBUR SMALL (A) 05/01/2018 1748   BILIRUBINUR NEGATIVE 05/01/2018 1748   KETONESUR 20 (A) 05/01/2018 1748   PROTEINUR NEGATIVE 05/01/2018 1748   UROBILINOGEN 0.2 12/12/2007 1020   NITRITE POSITIVE (A) 05/01/2018 1748   LEUKOCYTESUR MODERATE (A) 05/01/2018 1748   Sepsis Labs: No results found for this or any previous visit (from the past 240 hour(s)).   Radiological Exams on Admission: Ct Head Wo Contrast  Result Date: 05/01/2018 CLINICAL DATA:  Altered level of consciousness, weakness, last seen normal Thursday, today unable to use RIGHT hand, history of stroke with LEFT side deficit, dementia, hypertension EXAM: CT HEAD WITHOUT CONTRAST TECHNIQUE: Contiguous axial images were obtained from the base of the skull through the vertex without intravenous contrast. Sagittal and coronal MPR images reconstructed from axial data set. COMPARISON:  01/21/2018 FINDINGS: Brain: Generalized atrophy. Normal ventricular morphology. No midline shift or mass effect. Small vessel chronic ischemic changes of deep cerebral white matter. Multiple old infarcts involving the basal ganglia and thalami bilaterally, question mildly increased in thalami since prior exam particularly on RIGHT. No intracranial hemorrhage, mass lesion, evidence of acute infarction, or extra-axial fluid collection. Vascular: Atherosclerotic calcifications of the internal carotid arteries at the skull base. Skull: Intact Sinuses/Orbits: Clear Other: N/A IMPRESSION: Atrophy with small vessel chronic ischemic changes of deep cerebral white matter. Lacunar infarcts in BILATERAL basal ganglia and thalami, question mildly progressive in thalami especially RIGHT since prior exam. Electronically Signed   By: Ulyses Southward M.D.   On: 05/01/2018 18:29   Dg Chest Port 1 View  Result Date:  05/01/2018 CLINICAL DATA:  RIGHT-side weakness, last seen normal on Thursday EXAM: PORTABLE CHEST 1 VIEW COMPARISON:  Portable exam 1732 hours compared to 01/21/2018 FINDINGS: Normal heart size, mediastinal contours, and pulmonary vascularity. Mild central peribronchial thickening and minimal LEFT basilar atelectasis. Lungs otherwise clear. No pulmonary infiltrate, pleural effusion or pneumothorax. Bones demineralized. IMPRESSION: Mild bronchitic changes with minimal LEFT basilar atelectasis. Electronically Signed  By: Ulyses SouthwardMark  Boles M.D.   On: 05/01/2018 18:02    EKG: Independently reviewed.  Sinus rhythm at 52 bpm with QTc 533  Assessment/Plan Right-sided weakness, history of CVA with residual left-sided weakness and dysphasia: Acute.  Patient's sister normally visits patient stated that she was unable to utilize her right hand which was new.  CT scan of brain showed chronic changes with possible progression of right-sided thalami  infarct.  Exact onset of symptoms unknown.  Therefore patient not candidate for intervention.  Patient reportedly failed initial swallow study. - Admit to a telemetry bed - Focused stroke order set initiated - Neuro checks   - Check MRI/ MRA of brain - Check vas Carotid doppler ultrasound, echocardiogram - Aspirin - Speech/OT/PT to eval and treat - Dr. Petra KubaKilpatrick of neurology consulted, follow-up for further recommendations    Hypernatremia 2/2 dehydration: Patient presents with a sodium of 152 on admission with estimated free water deficit utilizing 40% of total body weight is approximately 2.1 liters.  Patient had artery received 1 L of normal saline IV fluids while in the ED. - Strict intake and output - D5 W at 100 mL/h continuously should show at least initial correct sodium by morning - Recheck BMP in a.m.  Urinary tract infection: Acute.  Urinalysis was positive for nitrates, moderate leukocytes, many bacteria, and 21-50 WBCs.  Patient was started empirically on  Rocephin - Check urine culture - Continue Rocephin  Chest pain: Patient had complained of chest pain.  Initial troponin was noted to be negative. - Trend cardiac enzymes overnight   Prolonged QT interval, bradycardia: acute. Patient presents with heart rates in the upper 40s to 50s while awake with QTc noted to be 533. - Recheck  Coronary artery disease, peripheral vascular disease - Continue Plavix when patient able to take p.o. meds  Essential hypertension - Continue amlodipine when patient able to take p.o. meds  Dementia - Continue  donepezil when patient able to take p.o. meds  Hyperlipidemia - Continue atorvastatin when patient able to take p.o. meds   DVT prophylaxis: lovenox Code Status: DNR Family Communication: Discussed plan of care with Sister by phone Disposition Plan: To be discharged back to patient's nursing facility once medically stable Consults called: Neurology Admission status: inpatient  Clydie Braunondell A Smith MD [facility] Pager [phone]   If 7PM-7AM, please contact night-coverage [email or web address] Password [email or web address]  05/01/2018, 7:25 PM

## 2018-05-01 NOTE — ED Provider Notes (Signed)
[facility] EMERGENCY DEPARTMENT Provider Note   CSN: [id number] Arrival date & time: 05/01/18  1706     History   Chief Complaint Chief Complaint  Patient presents with  . Weakness    HPI Morgan Sutton is a 72 y.o. female hx of dementia, previous stroke with L sided weakness and aphasia here with R sided weakness, chest pain.  Patient is currently residing at [facility].  She has previous left-sided weakness from stroke.  Patient last normal was about 3 days ago when the sister went to visit her.  Today when the sister went to visit her, patient was unable to hold anything on the right hand and appears weak on the right side.  Patient apparently complained of some chest pain earlier but is nonverbal to me and unable to answer any questions.  The history is provided by the nursing home and the EMS personnel.  Level v caveat- previous stroke with aphasia   Past Medical History:  Diagnosis Date  . Cerebral infarction Feb 2019   . Dementia   . Hypertension   . Peripheral vascular disease (HCC)   . Stroke [facility])     Patient Active Problem List   Diagnosis Date Noted  . Depression 03/20/2018  . Pressure injury of skin 01/23/2018  . Acute kidney injury (HCC) 01/21/2018  . Acute hypernatremia 01/21/2018  . History of CVA (cerebrovascular accident) Feb 2019 01/21/2018  . Hypertension 01/21/2018  . Dementia 01/21/2018  . PAD (peripheral artery disease) (HCC) 01/21/2018  . Acute metabolic encephalopathy 01/21/2018    History reviewed. No pertinent surgical history.   OB History   None      Home Medications    Prior to Admission medications   Medication Sig Start Date End Date Taking? Authorizing Provider  acetaminophen (TYLENOL) 325 MG tablet Take 650 mg by mouth every 6 (six) hours as needed.    [provider]  Amino Acids-Protein Hydrolys (FEEDING SUPPLEMENT, PRO-STAT SUGAR FREE 64,) LIQD Take 30 mLs by mouth daily.    [provider]  amLODipine (NORVASC) 10 MG tablet Take 10 mg by mouth daily.    [provider]  aspirin EC 81 MG tablet Take 81 mg by mouth daily.    [provider]  atorvastatin (LIPITOR) 20 MG tablet Take 20 mg by mouth daily.    [provider]  cholecalciferol (VITAMIN D) 1000 units tablet Take 2,000 Units by mouth daily. Take 2 tablets to = 2000 units    [provider]  clopidogrel (PLAVIX) 75 MG tablet Take 75 mg by mouth daily.    [provider]  cyanocobalamin (,VITAMIN B-12,) 1000 MCG/ML injection Inject 1,000 mcg into the muscle every 30 (thirty) days. On or about the 12th of each month    [provider]  donepezil (ARICEPT) 10 MG tablet Take 10 mg by mouth at bedtime.    [provider]  hydrALAZINE (APRESOLINE) 50 MG tablet Take 1 tablet (50 mg total) by mouth every 8 (eight) hours. 01/25/18   Leroy Sea, MD  mirtazapine (REMERON) 15 MG tablet Take 15 mg by mouth at bedtime.    [provider]  Multiple Vitamins-Minerals (MULTIVITAMIN WITH MINERALS) tablet Take 1 tablet by mouth daily.    [provider]  Nutritional Supplements (NUTRITIONAL SUPPLEMENT PO) Take 1 each by mouth 2 (two) times daily. Magic Cup    [provider]    Family History No family history on  file.  Social History Social History   Tobacco Use  . Smoking status: Never Smoker  . Smokeless tobacco: Never Used  Substance Use Topics  . Alcohol use: No    Frequency: Never  . Drug use: No     Allergies   Patient has no known allergies.   Review of Systems Review of Systems  Neurological: Positive for weakness.  All other systems reviewed and are negative.    Physical Exam Updated Vital Signs BP (!) 181/82 (BP Location: Right Arm)   Pulse (!) 54   Temp 97.8 F (36.6 C) (Oral)   Resp 11   SpO2 96%   Physical Exam  Constitutional:  Chronically ill, nonverbal   HENT:  Head: Normocephalic.    MM dry   Eyes: Pupils are equal, round, and reactive to light. Conjunctivae and EOM are normal.  Neck: Normal range of motion. Neck supple.  Cardiovascular: Normal rate, regular rhythm and normal heart sounds.  Pulmonary/Chest: Effort normal and breath sounds normal. No stridor. No respiratory distress. She has no wheezes.  Abdominal: Soft. Bowel sounds are normal. She exhibits no distension. There is no tenderness. There is no guarding.  Musculoskeletal: Normal range of motion.  Neurological:  Alert, nonverbal. Contracted on L side (chronic). Strength 3/5 R arm and leg (new)   Skin: Skin is warm.  Psychiatric:  Unable   Nursing note and vitals reviewed.    ED Treatments / Results  Labs (all labs ordered are listed, but only abnormal results are displayed) Labs Reviewed  I-STAT CHEM 8, ED - Abnormal; Notable for the following components:      Result Value   Sodium 152 (*)    Chloride 113 (*)    Glucose, Bld 104 (*)    Calcium, Ion 1.13 (*)    Hemoglobin 16.7 (*)    HCT 49.0 (*)    All other components within normal limits  ETHANOL  PROTIME-INR  APTT  CBC  DIFFERENTIAL  COMPREHENSIVE METABOLIC PANEL  RAPID URINE DRUG SCREEN, HOSP PERFORMED  URINALYSIS, ROUTINE W REFLEX MICROSCOPIC  I-STAT TROPONIN, ED  CBG MONITORING, ED    EKG EKG Interpretation  Date/Time:  Sunday May 01 2018 17:15:02 EDT Ventricular Rate:  53 PR Interval:    QRS Duration: 106 QT Interval:  567 QTC Calculation: 533 R Axis:   71 Text Interpretation:  Sinus rhythm Nonspecific repol abnormality, diffuse leads Prolonged QT interval No significant change since last tracing Confirmed by Richardean Canal [phone]) on 05/01/2018 5:22:47 PM   Radiology No results found.  Procedures Procedures (including critical care time)   CRITICAL CARE Performed by: Richardean Canal   Total critical care time: 30 minutes  Critical care time was exclusive of separately billable procedures and treating other  patients.  Critical care was necessary to treat or prevent imminent or life-threatening deterioration.  Critical care was time spent personally by me on the following activities: development of treatment plan with patient and/or surrogate as well as nursing, discussions with consultants, evaluation of patient's response to treatment, examination of patient, obtaining history from patient or surrogate, ordering and performing treatments and interventions, ordering and review of laboratory studies, ordering and review of radiographic studies, pulse oximetry and re-evaluation of patient's condition.  Medications Ordered in ED Medications  sodium chloride 0.9 % bolus 1,000 mL (has no administration in time range)     Initial Impression / Assessment and Plan / ED Course  I have reviewed the triage vital signs and the  nursing notes.  Pertinent labs & imaging results that were available during my care of the patient were reviewed by me and considered in my medical decision making (see chart for details).    Morgan Sutton is a 72 y.o. female here with new R sided weakness, chest pain. Has chronic L sided weakness and now has R sided weakness, last normal around 3 days ago so out of the TPA window. Will get CT head, labs, UA, CXR.   7:12 PM Labs showed hypernatremia (152). Also has UTI. Patient has lacunar infarcts as well. Likely dehydration and UTI causing worsening symptoms. Its possible that she had new stroke as well. Given IVF, rocephin. Hospitalist to admit for UTI, hypernatremia, weakness.    Final Clinical Impressions(s) / ED Diagnoses   Final diagnoses:  None    ED Discharge Orders    None       Charlynne PanderYao, David Hsienta, MD 05/01/18 [phone]

## 2018-05-01 NOTE — ED Notes (Signed)
Pt sister Willodean RosenthalSybil Brown number is [phone]

## 2018-05-01 NOTE — ED Triage Notes (Signed)
[facility] for right sided weakness per pt sister. Pt sister states LNW Thursday, noticed today that pt was unable to use her right hand. Pt has hx of stroke with left sided deficits. Pt alert but nonverbal on arrival. Per EMS pt also sent for eval of CP, pt unable to give descriptors. Pt in NAD.

## 2018-05-01 NOTE — ED Notes (Signed)
Admitting provider at bedside.

## 2018-05-01 NOTE — Progress Notes (Signed)
Called ER for report and given by Ryland,RN.

## 2018-05-02 ENCOUNTER — Inpatient Hospital Stay (HOSPITAL_COMMUNITY): Payer: Medicare Other

## 2018-05-02 DIAGNOSIS — I639 Cerebral infarction, unspecified: Secondary | ICD-10-CM

## 2018-05-02 DIAGNOSIS — I340 Nonrheumatic mitral (valve) insufficiency: Secondary | ICD-10-CM

## 2018-05-02 LAB — BASIC METABOLIC PANEL
Anion gap: 14 (ref 5–15)
BUN: 14 mg/dL (ref 6–20)
CO2: 24 mmol/L (ref 22–32)
CREATININE: 0.73 mg/dL (ref 0.44–1.00)
Calcium: 8.9 mg/dL (ref 8.9–10.3)
Chloride: 113 mmol/L — ABNORMAL HIGH (ref 101–111)
GFR calc Af Amer: >60 mL/min (ref >60)
Glucose, Bld: 97 mg/dL (ref 65–99)
POTASSIUM: 3.2 mmol/L — AB (ref 3.5–5.1)
SODIUM: 151 mmol/L — AB (ref 135–145)

## 2018-05-02 LAB — LIPID PANEL
CHOL/HDL RATIO: 4.5 ratio
Cholesterol: 161 mg/dL (ref 0–200)
HDL: 36 mg/dL — AB (ref >40)
LDL Cholesterol: 102 mg/dL — ABNORMAL HIGH (ref 0–99)
TRIGLYCERIDES: 115 mg/dL (ref <150)
VLDL: 23 mg/dL (ref 0–40)

## 2018-05-02 LAB — TROPONIN I: Troponin I: <0.03 ng/mL (ref <0.03)

## 2018-05-02 LAB — MRSA PCR SCREENING: MRSA BY PCR: NEGATIVE

## 2018-05-02 LAB — CBC
HCT: 47.7 % — ABNORMAL HIGH (ref 36.0–46.0)
Hemoglobin: 14.7 g/dL (ref 12.0–15.0)
MCH: 28.6 pg (ref 26.0–34.0)
MCHC: 30.8 g/dL (ref 30.0–36.0)
MCV: 92.8 fL (ref 78.0–100.0)
PLATELETS: 229 10*3/uL (ref 150–400)
RBC: 5.14 MIL/uL — AB (ref 3.87–5.11)
RDW: 15.3 % (ref 11.5–15.5)
WBC: 10.2 10*3/uL (ref 4.0–10.5)

## 2018-05-02 LAB — HEMOGLOBIN A1C
Hgb A1c MFr Bld: 5.5 % (ref 4.8–5.6)
MEAN PLASMA GLUCOSE: 111.15 mg/dL

## 2018-05-02 LAB — ECHOCARDIOGRAM COMPLETE

## 2018-05-02 MED ORDER — HYDRALAZINE HCL 20 MG/ML IJ SOLN: 10.0000 mg | Freq: Four times a day (QID) | INTRAMUSCULAR | Status: DC | PRN

## 2018-05-02 MED ORDER — POTASSIUM CHLORIDE 20 MEQ/15ML (10%) PO SOLN
40.0000 meq | Freq: Once | ORAL | Status: DC
  Filled 2018-05-02 (×2): qty 30

## 2018-05-02 MED ORDER — POTASSIUM CHLORIDE 20 MEQ/15ML (10%) PO SOLN
40.0000 meq | Freq: Once | ORAL | Status: AC
  Administered 2018-05-02: 40 meq via ORAL
  Filled 2018-05-02: qty 30

## 2018-05-02 NOTE — Evaluation (Signed)
Occupational Therapy Evaluation Patient Details Name: Morgan Sutton MRN: [id number] DOB: 01-15-1946 Today's Date: 05/02/2018    History of Present Illness Pt is a 72 y/o female admitted for possible CVA, workup pending.  PMH significant for CVA and dementia   Clinical Impression   This 72 y/o female presents with the above. PLOF obtained through chart review; Pt was most recently [facility], nonambulatory and receiving assist for ADLs, though per chart was able to complete self-feeding. Prior to CVA in Feb 2019 pt was using RW for functional mobility. Pt nonverbal this session; responds to name and makes eye contact with therapist when spoken to, though minimally responds to one step commands during session. Pt requiring Max hand over hand assist to complete simple grooming ADLs at bed level using RUE; requiring totalA for all other ADLs at bed level at this time. Pt will benefit from continued acute OT services and recommend continued OT services in SNF setting after discharge to maximize her overall safety and independence with ADLs and mobility.     Follow Up Recommendations  SNF;Supervision/Assistance - 24 hour    Equipment Recommendations  Other (comment)(TBD in next venue )           Precautions / Restrictions Precautions Precautions: Fall Restrictions Weight Bearing Restrictions: No                                                                                        ADL either performed or assessed with clinical judgement   ADL Overall ADL's : Needs assistance/impaired     Grooming: Maximal assistance;Bed level Grooming Details (indicate cue type and reason): mod-max hand over hand assist to wash face using RUE, pt requires assist to use UE, though once task is initiated pt attempting to complete task without assist, requires assist for thoroughness                                General  ADL Comments: Pt requiring maxA for simple grooming ADLs at bed level; otherwise requiring totalA for ADL completion at this time, suspect partly due to impaired cognition      Vision   Additional Comments: pt tracking to L and R during session, makes eye contact with therapist when spoken to or when stating pt's name; to be further assessed                 Pertinent Vitals/Pain Pain Assessment: Faces Faces Pain Scale: No hurt Pain Intervention(s): Monitored during session     Hand Dominance Right   Extremity/Trunk Assessment Upper Extremity Assessment Upper Extremity Assessment: Difficult to assess due to impaired cognition;LUE deficits/detail;RUE deficits/detail RUE Deficits / Details: increased difficulty to assess due to pt not following commands; PROM WFL; noted one instance of pt attempting to move/use RUE during session with minimal activation noted;  LUE Deficits / Details: increased difficulty to assess due to pt not following commands; [facility]   Lower Extremity Assessment Lower Extremity Assessment: Defer to PT evaluation       Communication Communication Communication: Expressive difficulties  Cognition Arousal/Alertness: Lethargic Behavior During Therapy: Flat affect Overall Cognitive Status: History of cognitive impairments - at baseline                                 General Comments: pt with baseline dementia; minimally follows one step commands this session givem multimodal cues    General Comments                  Home Living Family/patient expects to be discharged to:: Skilled nursing facility                                        Prior Functioning/Environment Level of Independence: Needs assistance  Gait / Transfers Assistance Needed: per chart review, pt nonambulatory prior to this admission ADL's / Homemaking Assistance Needed: Pt was able to self-feed and use RUE for self-feeding per chart review, needing  assist for additional ADLs    Comments: Pt in SNF since CVA in Feb 2019 [facility])        OT Problem List: Decreased strength;Decreased activity tolerance;Decreased cognition;Impaired UE functional use;Decreased range of motion;Impaired balance (sitting and/or standing)      OT Treatment/Interventions: Self-care/ADL training;DME and/or AE instruction;Therapeutic activities;Balance training;Therapeutic exercise;Patient/family education;Cognitive remediation/compensation;Visual/perceptual remediation/compensation    OT Goals(Current goals can be found in the care plan section) Acute Rehab OT Goals Patient Stated Goal: none stated OT Goal Formulation: Patient unable to participate in goal setting Time For Goal Achievement: 05/16/18 Potential to Achieve Goals: Fair  OT Frequency: Min 2X/week   Barriers to D/C:            Co-evaluation              AM-PAC PT "6 Clicks" Daily Activity     Outcome Measure Help from another person eating meals?: A Lot Help from another person taking care of personal grooming?: A Lot Help from another person toileting, which includes using toliet, bedpan, or urinal?: Total Help from another person bathing (including washing, rinsing, drying)?: Total Help from another person to put on and taking off regular upper body clothing?: Total Help from another person to put on and taking off regular lower body clothing?: Total 6 Click Score: 8   End of Session Nurse Communication: Mobility status  Activity Tolerance: Patient limited by lethargy Patient left: in bed;with call bell/phone within reach;with bed alarm set  OT Visit Diagnosis: Muscle weakness (generalized) (M62.81);Cognitive communication deficit (R41.841)                Time: 9604-5409 OT Time Calculation (min): 11 min Charges:  OT General Charges $OT Visit: 1 Visit OT Evaluation $OT Eval Moderate Complexity: 1 Mod G-Codes:     Morgan Sutton, OT Pager  [phone] 05/02/2018  Morgan Sutton 05/02/2018, 4:01 PM

## 2018-05-02 NOTE — Evaluation (Signed)
Physical Therapy Evaluation Patient Details Name: Morgan Sutton MRN: [id number] DOB: 01/30/1946 Today's Date: 05/02/2018   History of Present Illness  Pt is a 72 y/o female admitted for possible CVA, workup pending.  PMH significant for CVA and dementia  Clinical Impression  Pt essentially nonverbal throughout session, lethargic initially though with increasing alertness throughout session.  Attempted supine>sit but noted pt incontinent of bladder.  Rolling L and R for hygiene and placement of new/clean draw sheet and gown.  Pt would benefit from skilled PT to progress strength, balance, and functional mobility while in acute care, f/u with SNF as prior to admission.     Follow Up Recommendations SNF;Supervision/Assistance - 24 hour    Equipment Recommendations  None recommended by PT    Recommendations for Other Services       Precautions / Restrictions Precautions Precautions: Fall Restrictions Weight Bearing Restrictions: No      Mobility  Bed Mobility Overal bed mobility: Needs Assistance Bed Mobility: Rolling Rolling: Total assist         General bed mobility comments: Requires total assist to initiate and complete rolling R and L for hygiene and clothing mnagement  Transfers                    Ambulation/Gait                Stairs            Wheelchair Mobility    Modified Rankin (Stroke Patients Only)       Balance                                             Pertinent Vitals/Pain Pain Assessment: Faces Faces Pain Scale: No hurt    Home Living Family/patient expects to be discharged to:: Skilled nursing facility                      Prior Function Level of Independence: Needs assistance         Comments: Unsure of PLOF as no family present to determine.  Pt in SNF since CVA in Feb 2019.      Hand Dominance        Extremity/Trunk Assessment        Lower Extremity Assessment Lower  Extremity Assessment: Difficult to assess due to impaired cognition       Communication   Communication: Expressive difficulties  Cognition Arousal/Alertness: Lethargic Behavior During Therapy: Flat affect Overall Cognitive Status: History of cognitive impairments - at baseline                                        General Comments      Exercises     Assessment/Plan    PT Assessment Patient needs continued PT services  PT Problem List Decreased strength;Decreased activity tolerance;Decreased mobility       PT Treatment Interventions Functional mobility training;Therapeutic activities;Therapeutic exercise;Balance training;Cognitive remediation    PT Goals (Current goals can be found in the Care Plan section)  Acute Rehab PT Goals Patient Stated Goal: none stated PT Goal Formulation: Patient unable to participate in goal setting Time For Goal Achievement: 05/16/18 Potential to Achieve Goals: Fair    Frequency Min 2X/week   Barriers to  discharge        Co-evaluation               AM-PAC PT "6 Clicks" Daily Activity  Outcome Measure Difficulty turning over in bed (including adjusting bedclothes, sheets and blankets)?: A Lot Difficulty moving from lying on back to sitting on the side of the bed? : Unable Difficulty sitting down on and standing up from a chair with arms (e.g., wheelchair, bedside commode, etc,.)?: Unable Help needed moving to and from a bed to chair (including a wheelchair)?: Total Help needed walking in hospital room?: Total Help needed climbing 3-5 steps with a railing? : Total 6 Click Score: 7    End of Session   Activity Tolerance: Patient limited by lethargy Patient left: in bed;with call bell/phone within reach;with bed alarm set Nurse Communication: Mobility status PT Visit Diagnosis: Muscle weakness (generalized) (M62.81);Difficulty in walking, not elsewhere classified (R26.2)    Time: 1130-1145 PT Time Calculation  (min) (ACUTE ONLY): 15 min   Charges:   PT Evaluation $PT Eval Moderate Complexity: 1 Mod     PT G Codes:          Stephania FragminCaitlin E Deah Ottaway 05/02/2018, 12:00 PM

## 2018-05-02 NOTE — Progress Notes (Signed)
Modified Barium Swallow Progress Note  Patient Details  Name: Norval Mortonris J Traughber MRN: [id number] Date of Birth: 29-Jan-1946  Today's Date: 05/02/2018  Modified Barium Swallow completed.  Full report located under Chart Review in the Imaging Section.  Brief recommendations include the following:  Clinical Impression  Pt has a moderate oral dysphagia characterized by weak labial seal, lingual manipulation, and posterior propulsion. She has limited ability to masticate soft solids, holding boluses anteriorly with small munching pattern without fully forming a cohesive bolus. Minimal amounts of soft solids were able to reach the pharynx to be swallowed, with the rest of the residue manually removed by SLP despite Max cues for lingual sweep and pureed/liquid washes. She has baseline left-sided weakness as well as new right-sided weakness with residue pocketed bilaterally. Although still prolonged, her oral phase is more effective with purees and thin liquids, and when she swallows she has good airway protection (intermittent flash penetration only with thin liquids) and pharyngeal efficiency. Recommend starting Dys 1 diet and thin liquids with careful monitoring for oral residue.   Swallow Evaluation Recommendations       SLP Diet Recommendations: Dysphagia 1 (Puree) solids;Thin liquid   Liquid Administration via: Straw   Medication Administration: Crushed with puree   Supervision: Staff to assist with self feeding;Full supervision/cueing for compensatory strategies   Compensations: Minimize environmental distractions;Slow rate;Small sips/bites;Lingual sweep for clearance of pocketing;Monitor for anterior loss   Postural Changes: Seated upright at 90 degrees   Oral Care Recommendations: Oral care BID   Other Recommendations: Have oral suction available    Maxcine Hamaiewonsky, Carlisha Wisler 05/02/2018,4:00 PM   Maxcine HamLaura Paiewonsky, M.A. CCC-SLP [phone]

## 2018-05-02 NOTE — Progress Notes (Signed)
Pt. transported via bed from ER to 5W-10; pt. quiet and not answering questions, then may hear pt. softly say yes/okay; alert to self. No family with pt.; oriented to call button and room.

## 2018-05-02 NOTE — Progress Notes (Addendum)
STROKE TEAM PROGRESS NOTE   INTERVAL HISTORY Her brother is at the bedside.  She has been at [facility] since last stroke in Feb/2019.  She has been bedbound for about 7 months.  Patient with ongoing decline.  Her brother states that her sister helps take care of most of the medical decisions and processes.  He feels they would be open to considering palliative care.  Vitals:   05/02/18 0303 05/02/18 0510 05/02/18 0826 05/02/18 0843  BP: 138/66 (!) 157/71 (!) 161/75   Pulse: (!) 42 (!) 50 (!) 42   Resp:  15 14 10   Temp: 97.6 F (36.4 C) 97.6 F (36.4 C) 97.8 F (36.6 C)   TempSrc: Oral Oral Oral   SpO2: 98% 96% 97%     CBC:  Recent Labs  Lab 05/01/18 1743 05/01/18 1751 05/02/18 0331  WBC 7.4  --  10.2  NEUTROABS 4.6  --   --   HGB 15.9* 16.7* 14.7  HCT 51.6* 49.0* 47.7*  MCV 92.8  --  92.8  PLT 248  --  229    Basic Metabolic Panel:  Recent Labs  Lab 05/01/18 1743 05/01/18 1751 05/02/18 0331  NA 151* 152* 151*  K 3.9 3.8 3.2*  CL 115* 113* 113*  CO2 25  --  24  GLUCOSE 105* 104* 97  BUN 16 18 14   CREATININE 0.78 0.60 0.73  CALCIUM 9.4  --  8.9   Lipid Panel:     Component Value Date/Time   CHOL 161 05/02/2018 0331   TRIG 115 05/02/2018 0331   HDL 36 (L) 05/02/2018 0331   CHOLHDL 4.5 05/02/2018 0331   VLDL 23 05/02/2018 0331   LDLCALC 102 (H) 05/02/2018 0331   HgbA1c:  Lab Results  Component Value Date   HGBA1C 5.5 05/02/2018   Urine Drug Screen:     Component Value Date/Time   LABOPIA NONE DETECTED 05/01/2018 1748   COCAINSCRNUR NONE DETECTED 05/01/2018 1748   LABBENZ NONE DETECTED 05/01/2018 1748   AMPHETMU NONE DETECTED 05/01/2018 1748   THCU NONE DETECTED 05/01/2018 1748   LABBARB NONE DETECTED 05/01/2018 1748    Alcohol Level     Component Value Date/Time   ETH <10 05/01/2018 1745    IMAGING Ct Head Wo Contrast  Result Date: 05/01/2018 CLINICAL DATA:  Altered level of consciousness, weakness, last seen normal Thursday, today unable to use  RIGHT hand, history of stroke with LEFT side deficit, dementia, hypertension EXAM: CT HEAD WITHOUT CONTRAST TECHNIQUE: Contiguous axial images were obtained from the base of the skull through the vertex without intravenous contrast. Sagittal and coronal MPR images reconstructed from axial data set. COMPARISON:  01/21/2018 FINDINGS: Brain: Generalized atrophy. Normal ventricular morphology. No midline shift or mass effect. Small vessel chronic ischemic changes of deep cerebral white matter. Multiple old infarcts involving the basal ganglia and thalami bilaterally, question mildly increased in thalami since prior exam particularly on RIGHT. No intracranial hemorrhage, mass lesion, evidence of acute infarction, or extra-axial fluid collection. Vascular: Atherosclerotic calcifications of the internal carotid arteries at the skull base. Skull: Intact Sinuses/Orbits: Clear Other: N/A IMPRESSION: Atrophy with small vessel chronic ischemic changes of deep cerebral white matter. Lacunar infarcts in BILATERAL basal ganglia and thalami, question mildly progressive in thalami especially RIGHT since prior exam. Electronically Signed   By: Ulyses Southward M.D.   On: 05/01/2018 18:29   Mr Brain Wo Contrast  Result Date: 05/02/2018 CLINICAL DATA:  Initial evaluation for acute right-sided weakness. EXAM: MRI HEAD  WITHOUT CONTRAST MRA HEAD WITHOUT CONTRAST TECHNIQUE: Multiplanar, multiecho pulse sequences of the brain and surrounding structures were obtained without intravenous contrast. Angiographic images of the head were obtained using MRA technique without contrast. COMPARISON:  Prior CT from 05/01/2018 as well as previous MRI from 12/30/2017. FINDINGS: MRI HEAD FINDINGS Brain: Advanced cerebral atrophy for age. Extensive patchy and confluent T2/FLAIR hyperintensity throughout the periventricular and deep white matter both cerebral hemispheres, consistent with chronic microvascular ischemic disease, fairly advanced in nature.  Chronic microvascular ischemic changes extend into the brainstem and middle cerebellar peduncles as well. Multiple superimposed remote lacunar infarcts present within the hemispheric cerebral white matter, deep gray nuclei, and brainstem as well as the right middle cerebellar peduncle. Innumerable chronic micro hemorrhages again seen, primarily clustered around the deep gray nuclei and cerebellum, favored to reflect sequelae of poorly controlled hypertension. 6 mm acute ischemic infarct present at the right ventral pons (series 5, image 53). Probable additional faint subacute 6 mm ischemia infarct within the right periatrial white matter (series 5, image 60). No associated hemorrhage or mass effect. No other evidence for acute or subacute ischemia. No mass lesion, midline shift or mass effect. Diffuse ventricular prominence related to global parenchymal volume loss of hydrocephalus. No extra-axial fluid collection. Pituitary gland within normal limits. Vascular: Abnormal flow void within the left V4 segment, likely occluded. The major intracranial vascular flow voids otherwise maintained. Skull and upper cervical spine: Craniocervical junction within normal limits. Upper cervical spine demonstrates no significant finding. Bone marrow signal intensity within normal limits. No scalp soft tissue abnormality. Sinuses/Orbits: Globes and orbital soft tissues within normal limits. Patient status post lens extraction on the left. Paranasal sinuses are clear. Small volume opacity within the mastoid air cells bilaterally, of doubtful significance. Inner ear structures normal. Other: None. MRA HEAD FINDINGS ANTERIOR CIRCULATION: Distal examination degraded by motion artifact. Distal cervical segments of the internal carotid arteries are patent with antegrade flow. Petrous segments patent bilaterally. Scattered atheromatous irregularity within the cavernous/supraclinoid ICAs without high-grade stenosis, left worse than right.  A1 segments patent bilaterally anterior communicating artery grossly normal. Anterior cerebral arteries irregular but patent to their distal aspects without definite stenosis. M1 segments mildly irregular but patent without high-grade stenosis. Right MCA bifurcates early. Severe proximal left M2 stenoses noted (series 1045, image 8). Advanced small vessel atheromatous irregularity throughout the MCA branches distally. POSTERIOR CIRCULATION: Visualized right vertebral artery patent to the vertebrobasilar junction without stenosis. Left V4 segment not visualized, occluded. Slight retrograde filling into the distal left V4 segment. Right PICA patent proximally. Left PICA not visualized. Basilar irregular but patent to its distal aspect without flow-limiting stenosis. Superior cerebral arteries patent proximally. Probable severe left SCA stenosis noted (series 1037, image 9). Both of the PCAs arise from the basilar artery. Moderate right P1 stenosis (series 1037, image 9). Additional moderate distal right P2 stenosis (series 1037, image 16). Additional atheromatous irregularity throughout the remaining PCAs bilaterally, which do remain patent to their distal aspects. No aneurysm. IMPRESSION: MRI HEAD IMPRESSION: 1. 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 2. 6 mm subacute small vessel white matter infarct involving the right periatrial white matter. 3. Atrophy with severe chronic microvascular ischemic disease. 4. Innumerable chronic micro hemorrhages throughout the brain, favored to be secondary to poorly controlled hypertension. MRA HEAD IMPRESSION: 1. Occlusion of the left vertebral artery. 2. Advanced atheromatous change throughout the intracranial circulation. Notable findings include severe proximal left M2 stenoses, severe left SCA stenosis, with moderate right P1  and P2 stenoses. Electronically Signed   By: Rise Mu M.D.   On: 05/02/2018 03:18   Dg Chest Port 1  View  Result Date: 05/01/2018 CLINICAL DATA:  RIGHT-side weakness, last seen normal on Thursday EXAM: PORTABLE CHEST 1 VIEW COMPARISON:  Portable exam 1732 hours compared to 01/21/2018 FINDINGS: Normal heart size, mediastinal contours, and pulmonary vascularity. Mild central peribronchial thickening and minimal LEFT basilar atelectasis. Lungs otherwise clear. No pulmonary infiltrate, pleural effusion or pneumothorax. Bones demineralized. IMPRESSION: Mild bronchitic changes with minimal LEFT basilar atelectasis. Electronically Signed   By: Ulyses Southward M.D.   On: 05/01/2018 18:02   Mr Maxine Glenn Head Wo Contrast  Result Date: 05/02/2018 CLINICAL DATA:  Initial evaluation for acute right-sided weakness. EXAM: MRI HEAD WITHOUT CONTRAST MRA HEAD WITHOUT CONTRAST TECHNIQUE: Multiplanar, multiecho pulse sequences of the brain and surrounding structures were obtained without intravenous contrast. Angiographic images of the head were obtained using MRA technique without contrast. COMPARISON:  Prior CT from 05/01/2018 as well as previous MRI from 12/30/2017. FINDINGS: MRI HEAD FINDINGS Brain: Advanced cerebral atrophy for age. Extensive patchy and confluent T2/FLAIR hyperintensity throughout the periventricular and deep white matter both cerebral hemispheres, consistent with chronic microvascular ischemic disease, fairly advanced in nature. Chronic microvascular ischemic changes extend into the brainstem and middle cerebellar peduncles as well. Multiple superimposed remote lacunar infarcts present within the hemispheric cerebral white matter, deep gray nuclei, and brainstem as well as the right middle cerebellar peduncle. Innumerable chronic micro hemorrhages again seen, primarily clustered around the deep gray nuclei and cerebellum, favored to reflect sequelae of poorly controlled hypertension. 6 mm acute ischemic infarct present at the right ventral pons (series 5, image 53). Probable additional faint subacute 6 mm ischemia  infarct within the right periatrial white matter (series 5, image 60). No associated hemorrhage or mass effect. No other evidence for acute or subacute ischemia. No mass lesion, midline shift or mass effect. Diffuse ventricular prominence related to global parenchymal volume loss of hydrocephalus. No extra-axial fluid collection. Pituitary gland within normal limits. Vascular: Abnormal flow void within the left V4 segment, likely occluded. The major intracranial vascular flow voids otherwise maintained. Skull and upper cervical spine: Craniocervical junction within normal limits. Upper cervical spine demonstrates no significant finding. Bone marrow signal intensity within normal limits. No scalp soft tissue abnormality. Sinuses/Orbits: Globes and orbital soft tissues within normal limits. Patient status post lens extraction on the left. Paranasal sinuses are clear. Small volume opacity within the mastoid air cells bilaterally, of doubtful significance. Inner ear structures normal. Other: None. MRA HEAD FINDINGS ANTERIOR CIRCULATION: Distal examination degraded by motion artifact. Distal cervical segments of the internal carotid arteries are patent with antegrade flow. Petrous segments patent bilaterally. Scattered atheromatous irregularity within the cavernous/supraclinoid ICAs without high-grade stenosis, left worse than right. A1 segments patent bilaterally anterior communicating artery grossly normal. Anterior cerebral arteries irregular but patent to their distal aspects without definite stenosis. M1 segments mildly irregular but patent without high-grade stenosis. Right MCA bifurcates early. Severe proximal left M2 stenoses noted (series 1045, image 8). Advanced small vessel atheromatous irregularity throughout the MCA branches distally. POSTERIOR CIRCULATION: Visualized right vertebral artery patent to the vertebrobasilar junction without stenosis. Left V4 segment not visualized, occluded. Slight retrograde  filling into the distal left V4 segment. Right PICA patent proximally. Left PICA not visualized. Basilar irregular but patent to its distal aspect without flow-limiting stenosis. Superior cerebral arteries patent proximally. Probable severe left SCA stenosis noted (series 1037, image 9). Both of the  PCAs arise from the basilar artery. Moderate right P1 stenosis (series 1037, image 9). Additional moderate distal right P2 stenosis (series 1037, image 16). Additional atheromatous irregularity throughout the remaining PCAs bilaterally, which do remain patent to their distal aspects. No aneurysm. IMPRESSION: MRI HEAD IMPRESSION: 1. 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 2. 6 mm subacute small vessel white matter infarct involving the right periatrial white matter. 3. Atrophy with severe chronic microvascular ischemic disease. 4. Innumerable chronic micro hemorrhages throughout the brain, favored to be secondary to poorly controlled hypertension. MRA HEAD IMPRESSION: 1. Occlusion of the left vertebral artery. 2. Advanced atheromatous change throughout the intracranial circulation. Notable findings include severe proximal left M2 stenoses, severe left SCA stenosis, with moderate right P1 and P2 stenoses. Electronically Signed   By: Rise Mu M.D.   On: 05/02/2018 03:18   2D Echocardiogram  pending  Carotid Doppler   pending   PHYSICAL EXAM  i Constitutional: frail elderly Caucasian lady not in distress Psych: Affect appropriate to situation Eyes: No scleral injection HENT: No OP obstrucion Head: Normocephalic.  Cardiovascular: Normal rate and regular rhythm.  Respiratory: Effort normal, non-labored breathing Skin: WDI  Neuro: Mental Status: Patient is awake, alert, mute.  Cranial Nerves: II: Visual Fields are full. Pupils are equal, round, and reactive to light.   III,IV, VI: EOMI without ptosis or diploplia.  V: Facial sensation is symmetric  VII: Facial  movement appears to be slightly weak on the right VIII: hearing is intact to voice X: Uvula elevates symmetrically XI: Shoulder shrug is symmetric. XII: tongue is midline without atrophy or fasciculations.  Motor: She has 3/5 strength in the left arm, does not move her left leg  , she holds her right arm aloft with 4/5 strength, does not hold up her right leg, but does wiggle toes bilaterally. Sensory: She endorses symmetric sensation Cerebellar: She does not perform   ASSESSMENT/PLAN [name] is a 72 y.o. female with history of previous stroke in 12/2017, HTN, HLD, PVD and baseline dementia who is bedbound presenting with R hemiparesis.   Stroke:  right ventral pontine and R corona radiata infarcts  secondary to small vessel disease    CT head No acute stroke. Small vessel disease. Atrophy. B BG and thalamic lacunes, progressive since Feb.   MRI  R ventral pontine infarct. R periatrial WM infarct. Small vessel disease. Atrophy.  Multiple chronic microhemorrhages throughout the brain (due to uncontrolled hypertension)  MRA  L VA occlusion. Advanced atherosclerosis:  L M2, L SCA, R P1 and P2  Carotid Doppler  pending   2D Echo  pending   LDL 102  HgbA1c 5.5  Lovenox 40 mg sq daily for VTE prophylaxis  aspirin 81 mg daily and clopidogrel 75 mg daily prior to admission, now on aspirin 325 mg daily and clopidogrel 75 mg daily.  Okay to continue aspirin 81 mg and Plavix 75 mg daily at discharge x 3 weeks then one alone  Therapy recommendations:  SNF   Disposition:  pending (from SNF)  Given ongoing decline and poor neuro prognosis, palliative care consult placed  Dysphagia secondary to stroke  NPO  Failed swallow  For MBS today  Hypertension  Stable . Permissive hypertension (OK if < 220/120) but gradually normalize in 5-7 days . Long-term BP goal normotensive  Hyperlipidemia  Home meds: Lipitor 20, resumed in hospital  LDL 102, goal < 70  Continue  statin at discharge  Other Stroke Risk Factors  Advanced age  Hx stroke/TIA  12/2017 -   Coronary artery disease  PVD - B iliac stent 2017  Other Active Problems  Baseline dementia w/ psychotic features  Hypokalemia 3.2  Hospital day # 1  Annie MainSharon Biby, MSN, APRN, ANVP-BC, AGPCNP-BC Advanced Practice Stroke Nurse [facility] See Amion for Schedule & Pager information 05/02/2018 2:50 PM  I have personally examined this patient, reviewed notes, independently viewed imaging studies, participated in medical decision making and plan of care.ROS completed by me personally and pertinent positives fully documented  I have made any additions or clarifications directly to the above note. Agree with note above.   patient has had a gradual decline in her health for the last 7 months and is essentially been bedbound. With this new lacunar infarcts high expected condition declined further and discussions about palliative care and CODE STATUS appropriate. The patient's son is arriving tomorrow and I encourage palliative care consult to go over these decisions. Long discussion with the patient's brother at the bedside and answered questions. Greater than 50% time during this 35 minute visit was spent on counseling and coordination of care about her lacunar strokes and coronary declined medical care and discussion about palliative care options.  Delia HeadyPramod Sethi, MD Medical Director [facility] Pager: [phone] 05/02/2018 5:30 PM  To contact Stroke Continuity provider, please refer to [email or web address]. After hours, contact General Neurology

## 2018-05-02 NOTE — Progress Notes (Signed)
CCMD called, pt.'s HR occ. 39, had been 40-47; did see 37 x1.\; Dr. Antionette Charpyd paged.

## 2018-05-02 NOTE — Progress Notes (Signed)
Patient Demographics:    Morgan Sutton, is a 72 y.o. female, DOB - 28-Mar-1946, ZHY:[id number]  Admit date - 05/01/2018   Admitting Physician Clydie Braun, MD  Outpatient Primary MD for the patient is O'Buch, Greta, PA-C  LOS - 1   Chief Complaint  Patient presents with  . Weakness        Subjective:    Morgan Sutton today has no fevers, no emesis,  No chest pain, patient's brother Fayrene Fearing at bedside, questions answered, not really verbal  Assessment  & Plan :    Principal Problem:   Right sided weakness Active Problems:   Acute hypernatremia   History of CVA (cerebrovascular accident) Feb 2019   Hypertension   Dementia   PAD (peripheral artery disease) (HCC)   Dehydration  IMPRESSION: MRI HEAD IMPRESSION:  1. 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 2. 6 mm subacute small vessel white matter infarct involving the right periatrial white matter. 3. Atrophy with severe chronic microvascular ischemic disease. 4. Innumerable chronic micro hemorrhages throughout the brain, favored to be secondary to poorly controlled hypertension.  MRA HEAD IMPRESSION:  1. Occlusion of the left vertebral artery. 2. Advanced atheromatous change throughout the intracranial circulation. Notable findings include severe proximal left M2 stenoses, severe left SCA stenosis, with moderate right P1 and P2 stenoses.  Brief summary 72 y.o. right-handed female with medical history significant of HTN, HLD, CAD, recurrent CVA with residual left-sided weakness, dysphagia on pured diet, dementia, remote history of CAD, PVD with bilateral iliac stents in 2017--patient was admitted from [facility] nursing home on 05/01/2018 with concerns about new onset right-sided weakness, MRI brain however does not explain the new right-sided weakness which actually appears has improved significantly since admission  (within 12 to 16 hours) raising concerns about possible TIA.    Plan:-  1)Stroke:  right ventral pontine and R corona radiata infarcts  secondary to small vessel disease source--- MRI brain confirmed R ventral pontine infarct. R periatrial WM infarct, MRA Head shows   L VA occlusion... D/w Neurology/Stroke Team, they recommend increasing aspirin to 325 mg and contiinuing Plavix 71 mg, LDL is 102 goal is less than 70, A1c is 5.5.  Echocardiogram and carotid artery Dopplers are pending   2)HTN-allow permissive hypertension given acute stroke, keep systolic blood pressure under 295 and diastolic blood pressure under 284 for the next 5 days also  3)PAD-status post prior bilateral iliac stenting 2017, continue aspirin, Plavix and Lipitor as above  4)Neuropsych-patient with dementia with behavioral disturbance and psychotic features, suspect some component of vascular dementia  5)FEN/dysphagia-patient failed swallow eval, currently n.p.o. pending modified barium swallow, prior to admission patient was on pured diet  6)Social/Ethics-discussed with patient brother Fayrene Fearing at bedside, also discussed with patient's sister,   Code Status : DNR   Disposition Plan  : SNF  Consults  :  Neuro/Stroke team   DVT Prophylaxis  :  Lovenox    Lab Results  Component Value Date   PLT 229 05/02/2018    Inpatient Medications  Scheduled Meds: .  stroke: mapping our early stages of recovery book   Does not apply Once  . amLODipine  10 mg Oral Daily  . aspirin  300 mg Rectal Daily  Or  . aspirin  325 mg Oral Daily  . atorvastatin  20 mg Oral q1800  . clopidogrel  75 mg Oral Daily  . donepezil  10 mg Oral QHS  . enoxaparin (LOVENOX) injection  40 mg Subcutaneous Q24H  . mirtazapine  15 mg Oral QHS  . sodium chloride flush  3 mL Intravenous Q12H   Continuous Infusions: . cefTRIAXone (ROCEPHIN)  IV    . dextrose 75 mL/hr (05/02/18 1304)   PRN Meds:.acetaminophen **OR** acetaminophen,  hydrALAZINE, ipratropium-albuterol    Anti-infectives (From admission, onward)   Start     Dose/Rate Route Frequency Ordered Stop   05/02/18 2000  cefTRIAXone (ROCEPHIN) 1 g in sodium chloride 0.9 % 100 mL IVPB     1 g 200 mL/hr over 30 Minutes Intravenous Every 24 hours 05/01/18 1945     05/01/18 1915  cefTRIAXone (ROCEPHIN) 1 g in sodium chloride 0.9 % 100 mL IVPB     1 g 200 mL/hr over 30 Minutes Intravenous  Once 05/01/18 1909 05/01/18 2018        Objective:   Vitals:   05/02/18 0303 05/02/18 0510 05/02/18 0826 05/02/18 0843  BP: 138/66 (!) 157/71 (!) 161/75   Pulse: (!) 42 (!) 50 (!) 42   Resp:  15 14 10   Temp: 97.6 F (36.4 C) 97.6 F (36.4 C) 97.8 F (36.6 C)   TempSrc: Oral Oral Oral   SpO2: 98% 96% 97%     Wt Readings from Last 3 Encounters:  03/28/18 69.2 kg (152 lb 9.6 oz)  03/01/18 69.9 kg (154 lb)  02/22/18 74.3 kg (163 lb 12.8 oz)     Intake/Output Summary (Last 24 hours) at 05/02/2018 1619 Last data filed at 05/02/2018 [id number] Gross per 24 hour  Intake 1940 ml  Output -  Net 1940 ml     Physical Exam  Gen:- Awake Alert, resting comfortably HEENT:- [place].AT, No sclera icterus Neck-Supple Neck,No JVD,.  Lungs-  CTAB , diminished in bases CV- S1, S2 normal Abd-  +ve B.Sounds, Abd Soft, No tenderness,    Extremity/Skin:- No  edema,   good pulses Psych-not very verbal,  but able to tell me her name neuro- She has 3/5 strength in the left arm, does not move her left leg but unclear if this is behavioral, she holds her right arm aloft with 4/5 strength, does not hold up her right leg, but does wiggle toes bilaterally.     Data Review:   Micro Results Recent Results (from the past 240 hour(s))  MRSA PCR Screening     Status: None   Collection Time: 05/01/18 11:48 PM  Result Value Ref Range Status   MRSA by PCR NEGATIVE NEGATIVE Final    Comment:        The GeneXpert MRSA Assay (FDA approved for NASAL specimens only), is one component of  a comprehensive MRSA colonization surveillance program. It is not intended to diagnose MRSA infection nor to guide or monitor treatment for MRSA infections. Performed at [facility], [address] [place], [place] [address]     Radiology Reports Ct Head Wo Contrast  Result Date: 05/01/2018 CLINICAL DATA:  Altered level of consciousness, weakness, last seen normal Thursday, today unable to use RIGHT hand, history of stroke with LEFT side deficit, dementia, hypertension EXAM: CT HEAD WITHOUT CONTRAST TECHNIQUE: Contiguous axial images were obtained from the base of the skull through the vertex without intravenous contrast. Sagittal and coronal MPR images reconstructed from axial data  set. COMPARISON:  01/21/2018 FINDINGS: Brain: Generalized atrophy. Normal ventricular morphology. No midline shift or mass effect. Small vessel chronic ischemic changes of deep cerebral white matter. Multiple old infarcts involving the basal ganglia and thalami bilaterally, question mildly increased in thalami since prior exam particularly on RIGHT. No intracranial hemorrhage, mass lesion, evidence of acute infarction, or extra-axial fluid collection. Vascular: Atherosclerotic calcifications of the internal carotid arteries at the skull base. Skull: Intact Sinuses/Orbits: Clear Other: N/A IMPRESSION: Atrophy with small vessel chronic ischemic changes of deep cerebral white matter. Lacunar infarcts in BILATERAL basal ganglia and thalami, question mildly progressive in thalami especially RIGHT since prior exam. Electronically Signed   By: Ulyses Southward M.D.   On: 05/01/2018 18:29   Mr Brain Wo Contrast  Result Date: 05/02/2018 CLINICAL DATA:  Initial evaluation for acute right-sided weakness. EXAM: MRI HEAD WITHOUT CONTRAST MRA HEAD WITHOUT CONTRAST TECHNIQUE: Multiplanar, multiecho pulse sequences of the brain and surrounding structures were obtained without intravenous contrast. Angiographic images of the head were  obtained using MRA technique without contrast. COMPARISON:  Prior CT from 05/01/2018 as well as previous MRI from 12/30/2017. FINDINGS: MRI HEAD FINDINGS Brain: Advanced cerebral atrophy for age. Extensive patchy and confluent T2/FLAIR hyperintensity throughout the periventricular and deep white matter both cerebral hemispheres, consistent with chronic microvascular ischemic disease, fairly advanced in nature. Chronic microvascular ischemic changes extend into the brainstem and middle cerebellar peduncles as well. Multiple superimposed remote lacunar infarcts present within the hemispheric cerebral white matter, deep gray nuclei, and brainstem as well as the right middle cerebellar peduncle. Innumerable chronic micro hemorrhages again seen, primarily clustered around the deep gray nuclei and cerebellum, favored to reflect sequelae of poorly controlled hypertension. 6 mm acute ischemic infarct present at the right ventral pons (series 5, image 53). Probable additional faint subacute 6 mm ischemia infarct within the right periatrial white matter (series 5, image 60). No associated hemorrhage or mass effect. No other evidence for acute or subacute ischemia. No mass lesion, midline shift or mass effect. Diffuse ventricular prominence related to global parenchymal volume loss of hydrocephalus. No extra-axial fluid collection. Pituitary gland within normal limits. Vascular: Abnormal flow void within the left V4 segment, likely occluded. The major intracranial vascular flow voids otherwise maintained. Skull and upper cervical spine: Craniocervical junction within normal limits. Upper cervical spine demonstrates no significant finding. Bone marrow signal intensity within normal limits. No scalp soft tissue abnormality. Sinuses/Orbits: Globes and orbital soft tissues within normal limits. Patient status post lens extraction on the left. Paranasal sinuses are clear. Small volume opacity within the mastoid air cells  bilaterally, of doubtful significance. Inner ear structures normal. Other: None. MRA HEAD FINDINGS ANTERIOR CIRCULATION: Distal examination degraded by motion artifact. Distal cervical segments of the internal carotid arteries are patent with antegrade flow. Petrous segments patent bilaterally. Scattered atheromatous irregularity within the cavernous/supraclinoid ICAs without high-grade stenosis, left worse than right. A1 segments patent bilaterally anterior communicating artery grossly normal. Anterior cerebral arteries irregular but patent to their distal aspects without definite stenosis. M1 segments mildly irregular but patent without high-grade stenosis. Right MCA bifurcates early. Severe proximal left M2 stenoses noted (series 1045, image 8). Advanced small vessel atheromatous irregularity throughout the MCA branches distally. POSTERIOR CIRCULATION: Visualized right vertebral artery patent to the vertebrobasilar junction without stenosis. Left V4 segment not visualized, occluded. Slight retrograde filling into the distal left V4 segment. Right PICA patent proximally. Left PICA not visualized. Basilar irregular but patent to its distal aspect without flow-limiting stenosis. Superior  cerebral arteries patent proximally. Probable severe left SCA stenosis noted (series 1037, image 9). Both of the PCAs arise from the basilar artery. Moderate right P1 stenosis (series 1037, image 9). Additional moderate distal right P2 stenosis (series 1037, image 16). Additional atheromatous irregularity throughout the remaining PCAs bilaterally, which do remain patent to their distal aspects. No aneurysm. IMPRESSION: MRI HEAD IMPRESSION: 1. 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 2. 6 mm subacute small vessel white matter infarct involving the right periatrial white matter. 3. Atrophy with severe chronic microvascular ischemic disease. 4. Innumerable chronic micro hemorrhages throughout the brain, favored  to be secondary to poorly controlled hypertension. MRA HEAD IMPRESSION: 1. Occlusion of the left vertebral artery. 2. Advanced atheromatous change throughout the intracranial circulation. Notable findings include severe proximal left M2 stenoses, severe left SCA stenosis, with moderate right P1 and P2 stenoses. Electronically Signed   By: Rise Mu M.D.   On: 05/02/2018 03:18   Dg Chest Port 1 View  Result Date: 05/01/2018 CLINICAL DATA:  RIGHT-side weakness, last seen normal on Thursday EXAM: PORTABLE CHEST 1 VIEW COMPARISON:  Portable exam 1732 hours compared to 01/21/2018 FINDINGS: Normal heart size, mediastinal contours, and pulmonary vascularity. Mild central peribronchial thickening and minimal LEFT basilar atelectasis. Lungs otherwise clear. No pulmonary infiltrate, pleural effusion or pneumothorax. Bones demineralized. IMPRESSION: Mild bronchitic changes with minimal LEFT basilar atelectasis. Electronically Signed   By: Ulyses Southward M.D.   On: 05/01/2018 18:02   Dg Swallowing Func-speech Pathology  Result Date: 05/02/2018 Objective Swallowing Evaluation: Type of Study: MBS-Modified Barium Swallow Study  Patient Details Name: DAYJA LOVERIDGE MRN: [id number] Date of Birth: 23-Apr-1946 Today's Date: 05/02/2018 Time: SLP Start Time (ACUTE ONLY): 1459 -SLP Stop Time (ACUTE ONLY): 1517 SLP Time Calculation (min) (ACUTE ONLY): 18 min Past Medical History: Past Medical History: Diagnosis Date . Cerebral infarction Feb 2019  . Dementia  . HLD (hyperlipidemia)  . Hypertension  . Peripheral vascular disease (HCC)  . Stroke [facility])  Past Surgical History: Past Surgical History: Procedure Laterality Date . CHOLECYSTECTOMY   . LEG SURGERY    Per sister patient shattered leg requiring surgery with placement of hardware . STENT PLACEMENT ILIAC ([facility] HX)   HPI: Pt is a 72 y.o. right-handed female with medical history significant for HTN, HLD, CAD, CVA with residual left-sided weakness, dysphagia on pured diet,  dementia, remote history of CAD, PVD with bilateral iliac stents in 2017, who presents from [facility] with complaints of new right-sided weakness, decreased speech output. MRI shows acute R pontine infarct. Prior BSE 01/22/18 recommended Dys 3 diet and thin liquids due to altered mentation with recommendation to advance back to regular textures as mentation returned to baseline.  Subjective: pt alert, cooperative, minimally conversant but following commands Assessment / Plan / Recommendation CHL IP CLINICAL IMPRESSIONS 05/02/2018 Clinical Impression Pt has a moderate oral dysphagia characterized by weak labial seal, lingual manipulation, and posterior propulsion. She has limited ability to masticate soft solids, holding boluses anteriorly with small munching pattern without fully forming a cohesive bolus. Minimal amounts of soft solids were able to reach the pharynx to be swallowed, with the rest of the residue manually removed by SLP despite Max cues for lingual sweep and pureed/liquid washes. She has baseline left-sided weakness as well as new right-sided weakness with residue pocketed bilaterally. Although still prolonged, her oral phase is more effective with purees and thin liquids, and when she swallows she has good airway protection (intermittent flash penetration only with  thin liquids) and pharyngeal efficiency. Recommend starting Dys 1 diet and thin liquids with careful monitoring for oral residue. SLP Visit Diagnosis Dysphagia, oral phase (R13.11) Attention and concentration deficit following -- Frontal lobe and executive function deficit following -- Impact on safety and function Mild aspiration risk;Moderate aspiration risk   CHL IP TREATMENT RECOMMENDATION 05/02/2018 Treatment Recommendations Therapy as outlined in treatment plan below   Prognosis 05/02/2018 Prognosis for Safe Diet Advancement (No Data) Barriers to Reach Goals Cognitive deficits Barriers/Prognosis Comment -- CHL IP DIET RECOMMENDATION  05/02/2018 SLP Diet Recommendations Dysphagia 1 (Puree) solids;Thin liquid Liquid Administration via Straw Medication Administration Crushed with puree Compensations Minimize environmental distractions;Slow rate;Small sips/bites;Lingual sweep for clearance of pocketing;Monitor for anterior loss Postural Changes Seated upright at 90 degrees   CHL IP OTHER RECOMMENDATIONS 05/02/2018 Recommended Consults -- Oral Care Recommendations Oral care BID Other Recommendations Have oral suction available   CHL IP FOLLOW UP RECOMMENDATIONS 05/02/2018 Follow up Recommendations Skilled Nursing facility   [facility] IP FREQUENCY AND DURATION 05/02/2018 Speech Therapy Frequency (ACUTE ONLY) min 2x/week Treatment Duration 2 weeks      CHL IP ORAL PHASE 05/02/2018 Oral Phase Impaired Oral - Pudding Teaspoon -- Oral - Pudding Cup -- Oral - Honey Teaspoon -- Oral - Honey Cup -- Oral - Nectar Teaspoon -- Oral - Nectar Cup -- Oral - Nectar Straw -- Oral - Thin Teaspoon -- Oral - Thin Cup -- Oral - Thin Straw Weak lingual manipulation;Delayed oral transit Oral - Puree Weak lingual manipulation;Delayed oral transit;Right pocketing in lateral sulci;Left pocketing in lateral sulci;Lingual/palatal residue Oral - Mech Soft Weak lingual manipulation;Delayed oral transit;Right pocketing in lateral sulci;Left pocketing in lateral sulci;Lingual/palatal residue;Impaired mastication;Pocketing in anterior sulcus Oral - Regular -- Oral - Multi-Consistency -- Oral - Pill -- Oral Phase - Comment --  CHL IP PHARYNGEAL PHASE 05/02/2018 Pharyngeal Phase WFL Pharyngeal- Pudding Teaspoon -- Pharyngeal -- Pharyngeal- Pudding Cup -- Pharyngeal -- Pharyngeal- Honey Teaspoon -- Pharyngeal -- Pharyngeal- Honey Cup -- Pharyngeal -- Pharyngeal- Nectar Teaspoon -- Pharyngeal -- Pharyngeal- Nectar Cup -- Pharyngeal -- Pharyngeal- Nectar Straw -- Pharyngeal -- Pharyngeal- Thin Teaspoon -- Pharyngeal -- Pharyngeal- Thin Cup -- Pharyngeal -- Pharyngeal- Thin Straw -- Pharyngeal --  Pharyngeal- Puree -- Pharyngeal -- Pharyngeal- Mechanical Soft -- Pharyngeal -- Pharyngeal- Regular -- Pharyngeal -- Pharyngeal- Multi-consistency -- Pharyngeal -- Pharyngeal- Pill -- Pharyngeal -- Pharyngeal Comment --  CHL IP CERVICAL ESOPHAGEAL PHASE 05/02/2018 Cervical Esophageal Phase WFL Pudding Teaspoon -- Pudding Cup -- Honey Teaspoon -- Honey Cup -- Nectar Teaspoon -- Nectar Cup -- Nectar Straw -- Thin Teaspoon -- Thin Cup -- Thin Straw -- Puree -- Mechanical Soft -- Regular -- Multi-consistency -- Pill -- Cervical Esophageal Comment -- No flowsheet data found. Maxcine Ham 05/02/2018, 4:01 PM  Maxcine Ham, M.A. CCC-SLP [phone]             Mr Shirlee Latch Wo Contrast  Result Date: 05/02/2018 CLINICAL DATA:  Initial evaluation for acute right-sided weakness. EXAM: MRI HEAD WITHOUT CONTRAST MRA HEAD WITHOUT CONTRAST TECHNIQUE: Multiplanar, multiecho pulse sequences of the brain and surrounding structures were obtained without intravenous contrast. Angiographic images of the head were obtained using MRA technique without contrast. COMPARISON:  Prior CT from 05/01/2018 as well as previous MRI from 12/30/2017. FINDINGS: MRI HEAD FINDINGS Brain: Advanced cerebral atrophy for age. Extensive patchy and confluent T2/FLAIR hyperintensity throughout the periventricular and deep white matter both cerebral hemispheres, consistent with chronic microvascular ischemic disease, fairly advanced in nature. Chronic microvascular ischemic changes extend into  the brainstem and middle cerebellar peduncles as well. Multiple superimposed remote lacunar infarcts present within the hemispheric cerebral white matter, deep gray nuclei, and brainstem as well as the right middle cerebellar peduncle. Innumerable chronic micro hemorrhages again seen, primarily clustered around the deep gray nuclei and cerebellum, favored to reflect sequelae of poorly controlled hypertension. 6 mm acute ischemic infarct present at the right  ventral pons (series 5, image 53). Probable additional faint subacute 6 mm ischemia infarct within the right periatrial white matter (series 5, image 60). No associated hemorrhage or mass effect. No other evidence for acute or subacute ischemia. No mass lesion, midline shift or mass effect. Diffuse ventricular prominence related to global parenchymal volume loss of hydrocephalus. No extra-axial fluid collection. Pituitary gland within normal limits. Vascular: Abnormal flow void within the left V4 segment, likely occluded. The major intracranial vascular flow voids otherwise maintained. Skull and upper cervical spine: Craniocervical junction within normal limits. Upper cervical spine demonstrates no significant finding. Bone marrow signal intensity within normal limits. No scalp soft tissue abnormality. Sinuses/Orbits: Globes and orbital soft tissues within normal limits. Patient status post lens extraction on the left. Paranasal sinuses are clear. Small volume opacity within the mastoid air cells bilaterally, of doubtful significance. Inner ear structures normal. Other: None. MRA HEAD FINDINGS ANTERIOR CIRCULATION: Distal examination degraded by motion artifact. Distal cervical segments of the internal carotid arteries are patent with antegrade flow. Petrous segments patent bilaterally. Scattered atheromatous irregularity within the cavernous/supraclinoid ICAs without high-grade stenosis, left worse than right. A1 segments patent bilaterally anterior communicating artery grossly normal. Anterior cerebral arteries irregular but patent to their distal aspects without definite stenosis. M1 segments mildly irregular but patent without high-grade stenosis. Right MCA bifurcates early. Severe proximal left M2 stenoses noted (series 1045, image 8). Advanced small vessel atheromatous irregularity throughout the MCA branches distally. POSTERIOR CIRCULATION: Visualized right vertebral artery patent to the vertebrobasilar  junction without stenosis. Left V4 segment not visualized, occluded. Slight retrograde filling into the distal left V4 segment. Right PICA patent proximally. Left PICA not visualized. Basilar irregular but patent to its distal aspect without flow-limiting stenosis. Superior cerebral arteries patent proximally. Probable severe left SCA stenosis noted (series 1037, image 9). Both of the PCAs arise from the basilar artery. Moderate right P1 stenosis (series 1037, image 9). Additional moderate distal right P2 stenosis (series 1037, image 16). Additional atheromatous irregularity throughout the remaining PCAs bilaterally, which do remain patent to their distal aspects. No aneurysm. IMPRESSION: MRI HEAD IMPRESSION: 1. 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 2. 6 mm subacute small vessel white matter infarct involving the right periatrial white matter. 3. Atrophy with severe chronic microvascular ischemic disease. 4. Innumerable chronic micro hemorrhages throughout the brain, favored to be secondary to poorly controlled hypertension. MRA HEAD IMPRESSION: 1. Occlusion of the left vertebral artery. 2. Advanced atheromatous change throughout the intracranial circulation. Notable findings include severe proximal left M2 stenoses, severe left SCA stenosis, with moderate right P1 and P2 stenoses. Electronically Signed   By: Rise Mu M.D.   On: 05/02/2018 03:18     CBC Recent Labs  Lab 05/01/18 1743 05/01/18 1751 05/02/18 0331  WBC 7.4  --  10.2  HGB 15.9* 16.7* 14.7  HCT 51.6* 49.0* 47.7*  PLT 248  --  229  MCV 92.8  --  92.8  MCH 28.6  --  28.6  MCHC 30.8  --  30.8  RDW 15.2  --  15.3  LYMPHSABS 2.2  --   --  MONOABS 0.4  --   --   EOSABS 0.2  --   --   BASOSABS 0.1  --   --     Chemistries  Recent Labs  Lab 05/01/18 1743 05/01/18 1751 05/02/18 0331  NA 151* 152* 151*  K 3.9 3.8 3.2*  CL 115* 113* 113*  CO2 25  --  24  GLUCOSE 105* 104* 97  BUN 16 18 14     CREATININE 0.78 0.60 0.73  CALCIUM 9.4  --  8.9  AST 23  --   --   ALT 19  --   --   ALKPHOS 69  --   --   BILITOT 1.2  --   --    ------------------------------------------------------------------------------------------------------------------ Recent Labs    05/02/18 0331  CHOL 161  HDL 36*  LDLCALC 102*  TRIG 115  CHOLHDL 4.5    Lab Results  Component Value Date   HGBA1C 5.5 05/02/2018   ------------------------------------------------------------------------------------------------------------------ No results for input(s): TSH, T4TOTAL, T3FREE, THYROIDAB in the last 72 hours.  Invalid input(s): FREET3 ------------------------------------------------------------------------------------------------------------------ No results for input(s): VITAMINB12, FOLATE, FERRITIN, TIBC, IRON, RETICCTPCT in the last 72 hours.  Coagulation profile Recent Labs  Lab 05/01/18 1743  INR 1.13    No results for input(s): DDIMER in the last 72 hours.  Cardiac Enzymes Recent Labs  Lab 05/01/18 2044 05/02/18 0331 05/02/18 0731  TROPONINI <0.03 <0.03 <0.03   ------------------------------------------------------------------------------------------------------------------ No results found for: BNP   Cyris Maalouf M.D on 05/02/2018 at 4:19 PM  Between 7am to 7pm - Pager - [phone]  After 7pm go to [email or web address] - password [email or web address]  [facility] -  Office  [phone]   Voice Recognition Reubin Milan dictation system was used to create this note, attempts have been made to correct errors. Please contact the author with questions and/or clarifications.

## 2018-05-02 NOTE — Progress Notes (Signed)
Pts son, Tammy SoursGreg, and I gave pt a pill in applesauce and liquid potassium in orange juice and coke, small amounts of each and pt coughed, agreed her throat was burning and her eyes started watering afterward.

## 2018-05-02 NOTE — Progress Notes (Signed)
  Echocardiogram 2D Echocardiogram has been performed.  Janalyn HarderWest, Znya Albino R 05/02/2018, 5:02 PM

## 2018-05-02 NOTE — Plan of Care (Signed)
  Problem: Education: Goal: Knowledge of General Education information will improve Outcome: Progressing Note:  POC and orders reviewed with pt.; pt. seem to understand some.

## 2018-05-02 NOTE — Plan of Care (Signed)
Pt's hr remains 40-30's.  Pt remains npo.  Swallow eval and carotid doppler completed.

## 2018-05-02 NOTE — Evaluation (Signed)
Clinical/Bedside Swallow Evaluation Patient Details  Name: Morgan Sutton MRN: [id number] Date of Birth: [birth year]  Today's Date: 05/02/2018 Time: SLP Start Time (ACUTE ONLY): 1001 SLP Stop Time (ACUTE ONLY): 1026 SLP Time Calculation (min) (ACUTE ONLY): 25 min  Past Medical History:  Past Medical History:  Diagnosis Date  . Cerebral infarction Feb 2019   . Dementia   . HLD (hyperlipidemia)   . Hypertension   . Peripheral vascular disease (HCC)   . Stroke [facility](HCC)    Past Surgical History:  Past Surgical History:  Procedure Laterality Date  . CHOLECYSTECTOMY    . LEG SURGERY     Per sister patient shattered leg requiring surgery with placement of hardware  . STENT PLACEMENT ILIAC ([facility] HX)     HPI:  Pt is a 72 y.o. right-handed female with medical history significant for HTN, HLD, CAD, CVA with residual left-sided weakness, dysphagia on pured diet, dementia, remote history of CAD, PVD with bilateral iliac stents in 2017, who [facility] with complaints of new right-sided weakness, decreased speech output. MRI shows acute R pontine infarct. Prior BSE 01/22/18 recommended Dys 3 diet and thin liquids due to altered mentation with recommendation to advance back to regular textures as mentation returned to baseline.    Assessment / Plan / Recommendation Clinical Impression  Pt initially was drowsy with variable swallow initiation - anywhere from several seconds to over one minute after bolus presentation. She became more alert with more consistent swallow initiation after her brother arrived, although she continues to have intermittent, weak coughing with thin liquids and purees. Discussed with pt/family about concern for dysphagia and possibly aspiration, paritcularly given acute pontine infarct. They agree to proceed with MBS but would like to complete it this afternoon when their sister can be present. Will plan for this afternoon, but would keep pt NPO in the interim. SLP  Visit Diagnosis: Dysphagia, unspecified (R13.10)    Aspiration Risk  Moderate aspiration risk    Diet Recommendation NPO   Medication Administration: Via alternative means    Other  Recommendations Oral Care Recommendations: Oral care QID   Follow up Recommendations Skilled Nursing facility      Frequency and Duration            Prognosis Prognosis for Safe Diet Advancement: Good Barriers to Reach Goals: Cognitive deficits      Swallow Study   General HPI: Pt is a 72 y.o. right-handed female with medical history significant for HTN, HLD, CAD, CVA with residual left-sided weakness, dysphagia on pured diet, dementia, remote history of CAD, PVD with bilateral iliac stents in 2017, who [facility] with complaints of new right-sided weakness, decreased speech output. MRI shows acute R pontine infarct. Prior BSE 01/22/18 recommended Dys 3 diet and thin liquids due to altered mentation with recommendation to advance back to regular textures as mentation returned to baseline.  Type of Study: Bedside Swallow Evaluation Previous Swallow Assessment: see HPI Diet Prior to this Study: NPO Temperature Spikes Noted: No Respiratory Status: Room air History of Recent Intubation: No Behavior/Cognition: Alert;Cooperative;Requires cueing Oral Care Completed by SLP: No Oral Cavity - Dentition: Dentures, top(difficult to see lower dentition) Self-Feeding Abilities: Total assist Patient Positioning: Upright in bed Baseline Vocal Quality: Low vocal intensity Volitional Cough: Cognitively unable to elicit Volitional Swallow: Unable to elicit    Oral/Motor/Sensory Function Overall Oral Motor/Sensory Function: Other (comment)(difficulty following commands for assessment)   Ice Chips Ice chips: Impaired Presentation: Spoon Oral Phase Impairments: Poor awareness  of bolus Oral Phase Functional Implications: Oral holding Pharyngeal Phase Impairments: Suspected delayed Swallow   Thin  Liquid Thin Liquid: Impaired Presentation: Spoon Oral Phase Impairments: Poor awareness of bolus Pharyngeal  Phase Impairments: Suspected delayed Swallow;Cough - Immediate    Nectar Thick Nectar Thick Liquid: Not tested   Honey Thick Honey Thick Liquid: Not tested   Puree Puree: Impaired Presentation: Spoon Oral Phase Impairments: Poor awareness of bolus Oral Phase Functional Implications: Prolonged oral transit Pharyngeal Phase Impairments: Suspected delayed Swallow;Cough - Immediate   Solid   GO   Solid: Not tested        Maxcine Ham 05/02/2018,10:36 AM  Maxcine Ham, M.A. CCC-SLP [phone]

## 2018-05-02 NOTE — Progress Notes (Signed)
Spoke with Dr. Marisa Severinourage about urine culture and HR and BP.  Will obtain new ekg.

## 2018-05-02 NOTE — Progress Notes (Signed)
PT Cancellation Note  Patient Details Name: Morgan Sutton MRN: [id number] DOB: 17-Oct-1946   Cancelled Treatment:    Reason Eval/Treat Not Completed: Fatigue/lethargy limiting ability to participate   Stephania FragminCaitlin E Yuka Lallier 05/02/2018, 10:40 AM

## 2018-05-02 NOTE — Consult Note (Signed)
Neurology Consultation Reason for Consult: Right-sided weakness Referring Physician: Katrinka Blazing, R  CC: Right-sided weakness  History is obtained from: Patient, chart  HPI: Morgan Sutton is a 72 y.o. female with a history of previous stroke as well as dementia who presents with right-sided weakness.  Her sister had last seen her at that time, but then went to visit her today and noticed that she was not moving her right side quite as well.  Unclear how she speaks at baseline, but her speech is decreased today, though she tells me that she feels that this is her normal speech pattern.   LKW: 3 days ago tpa given?: no, outside of window    ROS:  Unable to obtain due to altered mental status.   Past Medical History:  Diagnosis Date  . Cerebral infarction Feb 2019   . Dementia   . HLD (hyperlipidemia)   . Hypertension   . Peripheral vascular disease (HCC)   . Stroke [facility])      Family History  Problem Relation Age of Onset  . Heart disease Father   . Dementia Mother      Social History:  reports that she has quit smoking. She has never used smokeless tobacco. She reports that she does not drink alcohol or use drugs.   Exam: Current vital signs: BP (!) 181/73 (BP Location: Right Arm)   Pulse 68   Temp 97.8 F (36.6 C) (Oral)   Resp 12   SpO2 96%  Vital signs in last 24 hours: Temp:  [97.8 F (36.6 C)-98.1 F (36.7 C)] 97.8 F (36.6 C) (06/10 0013) Pulse Rate:  [45-68] 68 (06/10 0013) Resp:  [9-17] 12 (06/09 2130) BP: (155-190)/(73-104) 181/73 (06/10 0013) SpO2:  [96 %-100 %] 96 % (06/10 0013)   Physical Exam  Constitutional: Appears well-developed and well-nourished.  Psych: Affect appropriate to situation Eyes: No scleral injection HENT: No OP obstrucion Head: Normocephalic.  Cardiovascular: Normal rate and regular rhythm.  Respiratory: Effort normal, non-labored breathing GI: Soft.  No distension. There is no tenderness.  Skin: WDI  Neuro: Mental  Status: Patient is awake, alert, she has very slowed speech with only one word answers, unable to tell me where she is or what month it is.  She is able to tell me her name and answer simple questions. Cranial Nerves: II: Visual Fields are full. Pupils are equal, round, and reactive to light.   III,IV, VI: EOMI without ptosis or diploplia.  V: Facial sensation is symmetric to temperature VII: Facial movement appears to be slightly weak on the right VIII: hearing is intact to voice X: Uvula elevates symmetrically XI: Shoulder shrug is symmetric. XII: tongue is midline without atrophy or fasciculations.  Motor: She has 3/5 strength in the left arm, does not move her left leg but unclear if this is behavioral, she holds her right arm aloft with 4/5 strength, does not hold up her right leg, but does wiggle toes bilaterally. Sensory: She endorses symmetric sensation Cerebellar: She does not perform     I have reviewed labs in epic and the results pertinent to this consultation are: Hyponatremia High hemoglobin  I have reviewed the images obtained: CT head- small vessel disease including lacunar infarcts  Impression: 72 year old female with a history of dementia and previous strokes who presents with worsening right-sided weakness.  I suspect that this does represent recurrent ischemic stroke.  She is being worked up for this  Recommendations: 1. HgbA1c, fasting lipid panel 2. MRI MRA  of the brain without contrast 3. Frequent neuro checks 4. Echocardiogram 5. Carotid dopplers 6. Prophylactic therapy-Plavix 75 mill grams daily as well as aspirin for 3 weeks followed by monotherapy 7. Risk factor modification 8. Telemetry monitoring 9. PT consult, OT consult, Speech consult 10. please page stroke NP  Or  PA  Or MD  from 8am -4 pm as this patient will be followed by the stroke team at this point.   You can look them up on [email or web address]      Ritta SlotMcNeill Corianna Avallone, MD [facility] [phone]  If 7pm- 7am, please page neurology on call as listed in AMION.

## 2018-05-02 NOTE — Plan of Care (Signed)
Discussed swallowing and urination and breathing after stroke with sister at bedside.  Called palliative care for her.

## 2018-05-02 NOTE — Progress Notes (Signed)
Carotid artery duplex has been completed. 1-39% ICA stenosis bilaterally.  05/02/18 2:03 PM Olen CordialGreg Wyoma Genson RVT

## 2018-05-03 DIAGNOSIS — Z515 Encounter for palliative care: Secondary | ICD-10-CM

## 2018-05-03 DIAGNOSIS — Z8673 Personal history of transient ischemic attack (TIA), and cerebral infarction without residual deficits: Secondary | ICD-10-CM

## 2018-05-03 DIAGNOSIS — F0151 Vascular dementia with behavioral disturbance: Secondary | ICD-10-CM

## 2018-05-03 DIAGNOSIS — Z66 Do not resuscitate: Secondary | ICD-10-CM

## 2018-05-03 NOTE — NC FL2 (Signed)
[place] MEDICAID FL2 LEVEL OF CARE SCREENING TOOL     IDENTIFICATION  Patient Name: Morgan Sutton Birthdate: 04-Jun-1946 Sex: female Admission Date (Current Location): 05/01/2018  [facility] [place]  [employer]   Facility and Address:  [facility], [address], [place], [place] [address]      Provider Number: [id number]  Attending Physician Name and Address:  Shon HaleEmokpae, Courage, MD  Relative Name and Phone Number:  Gillis EndsSybil, sister, [phone]    Current Level of Care: Hospital Recommended Level of Care: Skilled Nursing Facility Prior Approval Number:    Date Approved/Denied:   PASRR Number: [id number]  Discharge Plan: SNF    Current Diagnoses: Patient Active Problem List   Diagnosis Date Noted  . Right sided weakness 05/01/2018  . Dehydration 05/01/2018  . Depression 03/20/2018  . Pressure injury of skin 01/23/2018  . Acute kidney injury (HCC) 01/21/2018  . Acute hypernatremia 01/21/2018  . History of CVA (cerebrovascular accident) Feb 2019 01/21/2018  . Hypertension 01/21/2018  . Dementia 01/21/2018  . PAD (peripheral artery disease) (HCC) 01/21/2018  . Acute metabolic encephalopathy 01/21/2018    Orientation RESPIRATION BLADDER Height & Weight     Self  Normal Incontinent, External catheter Weight: 68.1 kg (150 lb 2.1 oz) Height:     BEHAVIORAL SYMPTOMS/MOOD NEUROLOGICAL BOWEL NUTRITION STATUS      Continent Diet(Please see DC Summary)  AMBULATORY STATUS COMMUNICATION OF NEEDS Skin   Extensive Assist Verbally PU Stage and Appropriate Care(Stage II on coccyx;Unstageable on heel)                       Personal Care Assistance Level of Assistance  Bathing, Feeding, Dressing Bathing Assistance: Maximum assistance Feeding assistance: Maximum assistance Dressing Assistance: Maximum assistance     Functional Limitations Info  Sight, Hearing, Speech Sight Info: Adequate Hearing Info: Adequate Speech Info: Adequate     SPECIAL CARE FACTORS FREQUENCY  PT (By licensed PT), OT (By licensed OT), Speech therapy     PT Frequency: 2x/week OT Frequency: 2x/week     Speech Therapy Frequency: 2x/week      Contractures      Additional Factors Info  Code Status, Allergies Code Status Info: [id number] A Allergies Info: Seroquel Quetiapine Fumarate           Current Medications (05/03/2018):  This is the current hospital active medication list Current Facility-Administered Medications  Medication Dose Route Frequency Provider Last Rate Last Dose  . acetaminophen (TYLENOL) tablet 650 mg  650 mg Oral Q6H PRN Madelyn FlavorsSmith, Rondell A, MD       Or  . acetaminophen (TYLENOL) suppository 650 mg  650 mg Rectal Q6H PRN Katrinka BlazingSmith, Rondell A, MD      . amLODipine (NORVASC) tablet 10 mg  10 mg Oral Daily Clydie BraunSmith, Rondell A, MD   Stopped at 05/02/18 1000  . aspirin suppository 300 mg  300 mg Rectal Daily Katrinka BlazingSmith, Rondell A, MD   300 mg at 05/02/18 1438   Or  . aspirin tablet 325 mg  325 mg Oral Daily Clydie BraunSmith, Rondell A, MD   Stopped at 05/02/18 1428  . atorvastatin (LIPITOR) tablet 20 mg  20 mg Oral q1800 Madelyn FlavorsSmith, Rondell A, MD   20 mg at 05/02/18 1709  . cefTRIAXone (ROCEPHIN) 1 g in sodium chloride 0.9 % 100 mL IVPB  1 g Intravenous Q24H Clydie BraunSmith, Rondell A, MD   Stopped at 05/02/18 2123  . clopidogrel (PLAVIX) tablet 75 mg  75  mg Oral Daily Clydie Braun, MD   Stopped at 05/02/18 1428  . dextrose 5 % solution   Intravenous Continuous Madelyn Flavors A, MD 75 mL/hr at 05/03/18 0411    . donepezil (ARICEPT) tablet 10 mg  10 mg Oral QHS Smith, Rondell A, MD   10 mg at 05/02/18 2150  . enoxaparin (LOVENOX) injection 40 mg  40 mg Subcutaneous Q24H Madelyn Flavors A, MD   40 mg at 05/02/18 2051  . hydrALAZINE (APRESOLINE) injection 10 mg  10 mg Intravenous Q6H PRN Emokpae, Courage, MD      . ipratropium-albuterol (DUONEB) 0.5-2.5 (3) MG/3ML nebulizer solution 3 mL  3 mL Nebulization Q6H PRN Smith, Rondell A, MD      . mirtazapine  (REMERON) tablet 15 mg  15 mg Oral QHS Smith, Rondell A, MD   15 mg at 05/02/18 2150  . potassium chloride 20 MEQ/15ML (10%) solution 40 mEq  40 mEq Oral Once Emokpae, Courage, MD      . sodium chloride flush (NS) 0.9 % injection 3 mL  3 mL Intravenous Q12H Clydie Braun, MD         Discharge Medications: Please see discharge summary for a list of discharge medications.  Relevant Imaging Results:  Relevant Lab Results:   Additional Information SSN: [id number] [place], [place]

## 2018-05-03 NOTE — Progress Notes (Signed)
Patient Demographics:    Morgan Sutton, is a 72 y.o. female, DOB - 1946/01/03, ZOX:[id number]  Admit date - 05/01/2018   Admitting Physician Clydie Braun, MD  Outpatient Primary MD for the patient is O'Buch, Greta, PA-C  LOS - 2   Chief Complaint  Patient presents with  . Weakness        Subjective:    Morgan Sutton today has no fevers, no emesis,  No chest pain, no new concerns, plan of care discussed with brother at bedside  Assessment  & Plan :    Principal Problem:   Right sided weakness Active Problems:   Acute hypernatremia   History of CVA (cerebrovascular accident) Feb 2019   Hypertension   Dementia   PAD (peripheral artery disease) (HCC)   Dehydration  IMPRESSION: MRI HEAD IMPRESSION:  1) 6 mm acute ischemic nonhemorrhagic ventral LEFT pontine infarct. 2) 6 mm subacute small vessel white matter infarct involving the right periatrial white matter. 3) Atrophy with severe chronic microvascular ischemic disease. 4) Innumerable chronic micro hemorrhages throughout the brain, favored to be secondary to poorly controlled hypertension.  MRA HEAD IMPRESSION:  1. Occlusion of the left vertebral artery. 2. Advanced atheromatous change throughout the intracranial circulation. Notable findings include severe proximal left M2 stenoses, severe left SCA stenosis, with moderate right P1 and P2 Stenoses.   2D Echo  EF 60-65%. RA highly echogenic echodensity 20x26mm  Consider TEE to further eval RA echodensity.      Brief summary 72 y.o. right-handed female with medical history significant of HTN, HLD, CAD, recurrent CVA with residual left-sided weakness, dysphagia on pured diet, dementia, remote history of CAD, PVD with bilateral iliac stents in 2017--patient was admitted from [facility] nursing home on 05/01/2018 with concerns about new onset right-sided weakness, MRI brain however does  not explain the new right-sided weakness which actually appears has improved significantly since admission (within 12 to 16 hours) raising concerns about possible TIA.    Plan:-  1)Stroke:  LEFT Ventral pontine and R corona radiata infarcts  secondary to small vessel disease source--- MRI brain confirmed Lt ventral pontine infarct. R periatrial WM infarct, MRA Head shows   L VA occlusion... D/w Neurology/Stroke Team, they recommend increasing aspirin to 325 mg and contiinuing Plavix 71 mg, LDL is 102 goal is less than 70, A1c is 5.5.  Echocardiogram suggests possible right atria echo genic mass, TEE recommended . ,  Noted artery Dopplers without hemodynamically significant stenosis.  Plan is for discharge with aspirin 81 mg and Plavix 75 mg for 3 weeks then Plavix alone after that from a stroke standpoint however patient may continue aspirin and Plavix due to PAD with previous stents, if TEE positive patient may be a candidate for DOAC  2)HTN-allow permissive hypertension given acute stroke, keep systolic blood pressure under 811 and diastolic blood pressure under 914 for the next 5 days also  3)PAD-status post prior bilateral iliac stenting 2017, continue aspirin, Plavix and Lipitor as above  4)Neuropsych-patient with dementia with behavioral disturbance and psychotic features, suspect some component of vascular dementia  5)FEN/dysphagia-patient failed swallow eval, currently n.p.o. pending modified barium swallow, prior to admission patient was on pured diet  6)Social/Ethics-discussed with patient brother Fayrene Fearing at bedside, also discussed with  patient's sister,   Code Status : DNR   Disposition Plan  : SNF  Consults  :  Neuro/Stroke team   DVT Prophylaxis  :  Lovenox    Lab Results  Component Value Date   PLT 229 05/02/2018    Inpatient Medications  Scheduled Meds: . amLODipine  10 mg Oral Daily  . aspirin  300 mg Rectal Daily   Or  . aspirin  325 mg Oral Daily  .  atorvastatin  20 mg Oral q1800  . clopidogrel  75 mg Oral Daily  . donepezil  10 mg Oral QHS  . enoxaparin (LOVENOX) injection  40 mg Subcutaneous Q24H  . mirtazapine  15 mg Oral QHS  . potassium chloride  40 mEq Oral Once  . sodium chloride flush  3 mL Intravenous Q12H   Continuous Infusions: . cefTRIAXone (ROCEPHIN)  IV Stopped (05/02/18 2123)  . dextrose 75 mL/hr (05/03/18 1603)   PRN Meds:.acetaminophen **OR** acetaminophen, hydrALAZINE, ipratropium-albuterol    Anti-infectives (From admission, onward)   Start     Dose/Rate Route Frequency Ordered Stop   05/02/18 2000  cefTRIAXone (ROCEPHIN) 1 g in sodium chloride 0.9 % 100 mL IVPB     1 g 200 mL/hr over 30 Minutes Intravenous Every 24 hours 05/01/18 1945     05/01/18 1915  cefTRIAXone (ROCEPHIN) 1 g in sodium chloride 0.9 % 100 mL IVPB     1 g 200 mL/hr over 30 Minutes Intravenous  Once 05/01/18 1909 05/01/18 2018        Objective:   Vitals:   05/03/18 0005 05/03/18 0406 05/03/18 0808 05/03/18 1504  BP: (!) 153/74 (!) 152/73 137/82 133/85  Pulse: (!) 48 (!) 49 (!) 45 (!) 58  Resp: 16 16 20 20   Temp: 98.1 F (36.7 C) 97.8 F (36.6 C) 98 F (36.7 C) 97.9 F (36.6 C)  TempSrc: Axillary Axillary Oral Oral  SpO2: 98% 97% 98% 98%  Weight:        Wt Readings from Last 3 Encounters:  05/02/18 68.1 kg (150 lb 2.1 oz)  03/28/18 69.2 kg (152 lb 9.6 oz)  03/01/18 69.9 kg (154 lb)     Intake/Output Summary (Last 24 hours) at 05/03/2018 1845 Last data filed at 05/03/2018 1603 Gross per 24 hour  Intake 1990 ml  Output 500 ml  Net 1490 ml     Physical Exam  Gen:- Awake Alert, resting comfortably HEENT:- [place].AT, No sclera icterus Neck-Supple Neck,No JVD,.  Lungs-  CTAB , diminished in bases CV- S1, S2 normal Abd-  +ve B.Sounds, Abd Soft, No tenderness,    Extremity:- No  edema,   good pulses Psych-not very verbal,  but able to tell me her name  neuro- Arms Held up equally with no arm drift. 4/5 strength in the  left arm, 3/5 left leg with decreased withdrawal to pain. right arm 4/5 strength, 4/5 R leg with brisk withdrawal.but limited effort and cooperation  wiggles toes bilaterally. Skin- Left Heel Necrotic unstageable Decubitus Ulcer/Pressure injury--- POA    Data Review:   Micro Results Recent Results (from the past 240 hour(s))  Urine Culture     Status: Abnormal (Preliminary result)   Collection Time: 05/01/18  6:06 PM  Result Value Ref Range Status   Specimen Description URINE, RANDOM  Final   Special Requests NONE  Final   Culture (A)  Final    >=100,000 COLONIES/mL ESCHERICHIA COLI >=100,000 COLONIES/mL UNIDENTIFIED ORGANISM Performed at [facility], [address]  [place], [place] [address]    Report Status PENDING  Incomplete  MRSA PCR Screening     Status: None   Collection Time: 05/01/18 11:48 PM  Result Value Ref Range Status   MRSA by PCR NEGATIVE NEGATIVE Final    Comment:        The GeneXpert MRSA Assay (FDA approved for NASAL specimens only), is one component of a comprehensive MRSA colonization surveillance program. It is not intended to diagnose MRSA infection nor to guide or monitor treatment for MRSA infections. Performed at [facility], [address] [place], [place] [address]     Radiology Reports Ct Head Wo Contrast  Result Date: 05/01/2018 CLINICAL DATA:  Altered level of consciousness, weakness, last seen normal Thursday, today unable to use RIGHT hand, history of stroke with LEFT side deficit, dementia, hypertension EXAM: CT HEAD WITHOUT CONTRAST TECHNIQUE: Contiguous axial images were obtained from the base of the skull through the vertex without intravenous contrast. Sagittal and coronal MPR images reconstructed from axial data set. COMPARISON:  01/21/2018 FINDINGS: Brain: Generalized atrophy. Normal ventricular morphology. No midline shift or mass effect. Small vessel chronic ischemic changes of deep cerebral white matter. Multiple old  infarcts involving the basal ganglia and thalami bilaterally, question mildly increased in thalami since prior exam particularly on RIGHT. No intracranial hemorrhage, mass lesion, evidence of acute infarction, or extra-axial fluid collection. Vascular: Atherosclerotic calcifications of the internal carotid arteries at the skull base. Skull: Intact Sinuses/Orbits: Clear Other: N/A IMPRESSION: Atrophy with small vessel chronic ischemic changes of deep cerebral white matter. Lacunar infarcts in BILATERAL basal ganglia and thalami, question mildly progressive in thalami especially RIGHT since prior exam. Electronically Signed   By: Ulyses Southward M.D.   On: 05/01/2018 18:29   Mr Brain Wo Contrast  Result Date: 05/02/2018 CLINICAL DATA:  Initial evaluation for acute right-sided weakness. EXAM: MRI HEAD WITHOUT CONTRAST MRA HEAD WITHOUT CONTRAST TECHNIQUE: Multiplanar, multiecho pulse sequences of the brain and surrounding structures were obtained without intravenous contrast. Angiographic images of the head were obtained using MRA technique without contrast. COMPARISON:  Prior CT from 05/01/2018 as well as previous MRI from 12/30/2017. FINDINGS: MRI HEAD FINDINGS Brain: Advanced cerebral atrophy for age. Extensive patchy and confluent T2/FLAIR hyperintensity throughout the periventricular and deep white matter both cerebral hemispheres, consistent with chronic microvascular ischemic disease, fairly advanced in nature. Chronic microvascular ischemic changes extend into the brainstem and middle cerebellar peduncles as well. Multiple superimposed remote lacunar infarcts present within the hemispheric cerebral white matter, deep gray nuclei, and brainstem as well as the right middle cerebellar peduncle. Innumerable chronic micro hemorrhages again seen, primarily clustered around the deep gray nuclei and cerebellum, favored to reflect sequelae of poorly controlled hypertension. 6 mm acute ischemic infarct present at the  right ventral pons (series 5, image 53). Probable additional faint subacute 6 mm ischemia infarct within the right periatrial white matter (series 5, image 60). No associated hemorrhage or mass effect. No other evidence for acute or subacute ischemia. No mass lesion, midline shift or mass effect. Diffuse ventricular prominence related to global parenchymal volume loss of hydrocephalus. No extra-axial fluid collection. Pituitary gland within normal limits. Vascular: Abnormal flow void within the left V4 segment, likely occluded. The major intracranial vascular flow voids otherwise maintained. Skull and upper cervical spine: Craniocervical junction within normal limits. Upper cervical spine demonstrates no significant finding. Bone marrow signal intensity within normal limits. No scalp soft tissue abnormality. Sinuses/Orbits: Globes and orbital soft tissues within normal  limits. Patient status post lens extraction on the left. Paranasal sinuses are clear. Small volume opacity within the mastoid air cells bilaterally, of doubtful significance. Inner ear structures normal. Other: None. MRA HEAD FINDINGS ANTERIOR CIRCULATION: Distal examination degraded by motion artifact. Distal cervical segments of the internal carotid arteries are patent with antegrade flow. Petrous segments patent bilaterally. Scattered atheromatous irregularity within the cavernous/supraclinoid ICAs without high-grade stenosis, left worse than right. A1 segments patent bilaterally anterior communicating artery grossly normal. Anterior cerebral arteries irregular but patent to their distal aspects without definite stenosis. M1 segments mildly irregular but patent without high-grade stenosis. Right MCA bifurcates early. Severe proximal left M2 stenoses noted (series 1045, image 8). Advanced small vessel atheromatous irregularity throughout the MCA branches distally. POSTERIOR CIRCULATION: Visualized right vertebral artery patent to the vertebrobasilar  junction without stenosis. Left V4 segment not visualized, occluded. Slight retrograde filling into the distal left V4 segment. Right PICA patent proximally. Left PICA not visualized. Basilar irregular but patent to its distal aspect without flow-limiting stenosis. Superior cerebral arteries patent proximally. Probable severe left SCA stenosis noted (series 1037, image 9). Both of the PCAs arise from the basilar artery. Moderate right P1 stenosis (series 1037, image 9). Additional moderate distal right P2 stenosis (series 1037, image 16). Additional atheromatous irregularity throughout the remaining PCAs bilaterally, which do remain patent to their distal aspects. No aneurysm. IMPRESSION: MRI HEAD IMPRESSION: 1. 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 2. 6 mm subacute small vessel white matter infarct involving the right periatrial white matter. 3. Atrophy with severe chronic microvascular ischemic disease. 4. Innumerable chronic micro hemorrhages throughout the brain, favored to be secondary to poorly controlled hypertension. MRA HEAD IMPRESSION: 1. Occlusion of the left vertebral artery. 2. Advanced atheromatous change throughout the intracranial circulation. Notable findings include severe proximal left M2 stenoses, severe left SCA stenosis, with moderate right P1 and P2 stenoses. Electronically Signed   By: Rise Mu M.D.   On: 05/02/2018 03:18   Dg Chest Port 1 View  Result Date: 05/01/2018 CLINICAL DATA:  RIGHT-side weakness, last seen normal on Thursday EXAM: PORTABLE CHEST 1 VIEW COMPARISON:  Portable exam 1732 hours compared to 01/21/2018 FINDINGS: Normal heart size, mediastinal contours, and pulmonary vascularity. Mild central peribronchial thickening and minimal LEFT basilar atelectasis. Lungs otherwise clear. No pulmonary infiltrate, pleural effusion or pneumothorax. Bones demineralized. IMPRESSION: Mild bronchitic changes with minimal LEFT basilar atelectasis.  Electronically Signed   By: Ulyses Southward M.D.   On: 05/01/2018 18:02   Dg Swallowing Func-speech Pathology  Result Date: 05/02/2018 Objective Swallowing Evaluation: Type of Study: MBS-Modified Barium Swallow Study  Patient Details Name: SYLINA HENION MRN: [id number] Date of Birth: 04-24-1946 Today's Date: 05/02/2018 Time: SLP Start Time (ACUTE ONLY): 1459 -SLP Stop Time (ACUTE ONLY): 1517 SLP Time Calculation (min) (ACUTE ONLY): 18 min Past Medical History: Past Medical History: Diagnosis Date . Cerebral infarction Feb 2019  . Dementia  . HLD (hyperlipidemia)  . Hypertension  . Peripheral vascular disease (HCC)  . Stroke [facility])  Past Surgical History: Past Surgical History: Procedure Laterality Date . CHOLECYSTECTOMY   . LEG SURGERY    Per sister patient shattered leg requiring surgery with placement of hardware . STENT PLACEMENT ILIAC ([facility] HX)   HPI: Pt is a 72 y.o. right-handed female with medical history significant for HTN, HLD, CAD, CVA with residual left-sided weakness, dysphagia on pured diet, dementia, remote history of CAD, PVD with bilateral iliac stents in 2017, who presents from [place] with  complaints of new right-sided weakness, decreased speech output. MRI shows acute R pontine infarct. Prior BSE 01/22/18 recommended Dys 3 diet and thin liquids due to altered mentation with recommendation to advance back to regular textures as mentation returned to baseline.  Subjective: pt alert, cooperative, minimally conversant but following commands Assessment / Plan / Recommendation CHL IP CLINICAL IMPRESSIONS 05/02/2018 Clinical Impression Pt has a moderate oral dysphagia characterized by weak labial seal, lingual manipulation, and posterior propulsion. She has limited ability to masticate soft solids, holding boluses anteriorly with small munching pattern without fully forming a cohesive bolus. Minimal amounts of soft solids were able to reach the pharynx to be swallowed, with the rest of the residue manually  removed by SLP despite Max cues for lingual sweep and pureed/liquid washes. She has baseline left-sided weakness as well as new right-sided weakness with residue pocketed bilaterally. Although still prolonged, her oral phase is more effective with purees and thin liquids, and when she swallows she has good airway protection (intermittent flash penetration only with thin liquids) and pharyngeal efficiency. Recommend starting Dys 1 diet and thin liquids with careful monitoring for oral residue. SLP Visit Diagnosis Dysphagia, oral phase (R13.11) Attention and concentration deficit following -- Frontal lobe and executive function deficit following -- Impact on safety and function Mild aspiration risk;Moderate aspiration risk   CHL IP TREATMENT RECOMMENDATION 05/02/2018 Treatment Recommendations Therapy as outlined in treatment plan below   Prognosis 05/02/2018 Prognosis for Safe Diet Advancement (No Data) Barriers to Reach Goals Cognitive deficits Barriers/Prognosis Comment -- CHL IP DIET RECOMMENDATION 05/02/2018 SLP Diet Recommendations Dysphagia 1 (Puree) solids;Thin liquid Liquid Administration via Straw Medication Administration Crushed with puree Compensations Minimize environmental distractions;Slow rate;Small sips/bites;Lingual sweep for clearance of pocketing;Monitor for anterior loss Postural Changes Seated upright at 90 degrees   CHL IP OTHER RECOMMENDATIONS 05/02/2018 Recommended Consults -- Oral Care Recommendations Oral care BID Other Recommendations Have oral suction available   CHL IP FOLLOW UP RECOMMENDATIONS 05/02/2018 Follow up Recommendations Skilled Nursing facility   [facility] IP FREQUENCY AND DURATION 05/02/2018 Speech Therapy Frequency (ACUTE ONLY) min 2x/week Treatment Duration 2 weeks      CHL IP ORAL PHASE 05/02/2018 Oral Phase Impaired Oral - Pudding Teaspoon -- Oral - Pudding Cup -- Oral - Honey Teaspoon -- Oral - Honey Cup -- Oral - Nectar Teaspoon -- Oral - Nectar Cup -- Oral - Nectar Straw -- Oral -  Thin Teaspoon -- Oral - Thin Cup -- Oral - Thin Straw Weak lingual manipulation;Delayed oral transit Oral - Puree Weak lingual manipulation;Delayed oral transit;Right pocketing in lateral sulci;Left pocketing in lateral sulci;Lingual/palatal residue Oral - Mech Soft Weak lingual manipulation;Delayed oral transit;Right pocketing in lateral sulci;Left pocketing in lateral sulci;Lingual/palatal residue;Impaired mastication;Pocketing in anterior sulcus Oral - Regular -- Oral - Multi-Consistency -- Oral - Pill -- Oral Phase - Comment --  CHL IP PHARYNGEAL PHASE 05/02/2018 Pharyngeal Phase WFL Pharyngeal- Pudding Teaspoon -- Pharyngeal -- Pharyngeal- Pudding Cup -- Pharyngeal -- Pharyngeal- Honey Teaspoon -- Pharyngeal -- Pharyngeal- Honey Cup -- Pharyngeal -- Pharyngeal- Nectar Teaspoon -- Pharyngeal -- Pharyngeal- Nectar Cup -- Pharyngeal -- Pharyngeal- Nectar Straw -- Pharyngeal -- Pharyngeal- Thin Teaspoon -- Pharyngeal -- Pharyngeal- Thin Cup -- Pharyngeal -- Pharyngeal- Thin Straw -- Pharyngeal -- Pharyngeal- Puree -- Pharyngeal -- Pharyngeal- Mechanical Soft -- Pharyngeal -- Pharyngeal- Regular -- Pharyngeal -- Pharyngeal- Multi-consistency -- Pharyngeal -- Pharyngeal- Pill -- Pharyngeal -- Pharyngeal Comment --  CHL IP CERVICAL ESOPHAGEAL PHASE 05/02/2018 Cervical Esophageal Phase WFL Pudding Teaspoon -- Pudding Cup --  Honey Teaspoon -- Honey Cup -- Nectar Teaspoon -- Nectar Cup -- Nectar Straw -- Thin Teaspoon -- Thin Cup -- Thin Straw -- Puree -- Mechanical Soft -- Regular -- Multi-consistency -- Pill -- Cervical Esophageal Comment -- No flowsheet data found. Maxcine Ham 05/02/2018, 4:01 PM  Maxcine Ham, M.A. CCC-SLP [phone]             Mr Shirlee Latch Wo Contrast  Result Date: 05/02/2018 CLINICAL DATA:  Initial evaluation for acute right-sided weakness. EXAM: MRI HEAD WITHOUT CONTRAST MRA HEAD WITHOUT CONTRAST TECHNIQUE: Multiplanar, multiecho pulse sequences of the brain and surrounding  structures were obtained without intravenous contrast. Angiographic images of the head were obtained using MRA technique without contrast. COMPARISON:  Prior CT from 05/01/2018 as well as previous MRI from 12/30/2017. FINDINGS: MRI HEAD FINDINGS Brain: Advanced cerebral atrophy for age. Extensive patchy and confluent T2/FLAIR hyperintensity throughout the periventricular and deep white matter both cerebral hemispheres, consistent with chronic microvascular ischemic disease, fairly advanced in nature. Chronic microvascular ischemic changes extend into the brainstem and middle cerebellar peduncles as well. Multiple superimposed remote lacunar infarcts present within the hemispheric cerebral white matter, deep gray nuclei, and brainstem as well as the right middle cerebellar peduncle. Innumerable chronic micro hemorrhages again seen, primarily clustered around the deep gray nuclei and cerebellum, favored to reflect sequelae of poorly controlled hypertension. 6 mm acute ischemic infarct present at the right ventral pons (series 5, image 53). Probable additional faint subacute 6 mm ischemia infarct within the right periatrial white matter (series 5, image 60). No associated hemorrhage or mass effect. No other evidence for acute or subacute ischemia. No mass lesion, midline shift or mass effect. Diffuse ventricular prominence related to global parenchymal volume loss of hydrocephalus. No extra-axial fluid collection. Pituitary gland within normal limits. Vascular: Abnormal flow void within the left V4 segment, likely occluded. The major intracranial vascular flow voids otherwise maintained. Skull and upper cervical spine: Craniocervical junction within normal limits. Upper cervical spine demonstrates no significant finding. Bone marrow signal intensity within normal limits. No scalp soft tissue abnormality. Sinuses/Orbits: Globes and orbital soft tissues within normal limits. Patient status post lens extraction on the  left. Paranasal sinuses are clear. Small volume opacity within the mastoid air cells bilaterally, of doubtful significance. Inner ear structures normal. Other: None. MRA HEAD FINDINGS ANTERIOR CIRCULATION: Distal examination degraded by motion artifact. Distal cervical segments of the internal carotid arteries are patent with antegrade flow. Petrous segments patent bilaterally. Scattered atheromatous irregularity within the cavernous/supraclinoid ICAs without high-grade stenosis, left worse than right. A1 segments patent bilaterally anterior communicating artery grossly normal. Anterior cerebral arteries irregular but patent to their distal aspects without definite stenosis. M1 segments mildly irregular but patent without high-grade stenosis. Right MCA bifurcates early. Severe proximal left M2 stenoses noted (series 1045, image 8). Advanced small vessel atheromatous irregularity throughout the MCA branches distally. POSTERIOR CIRCULATION: Visualized right vertebral artery patent to the vertebrobasilar junction without stenosis. Left V4 segment not visualized, occluded. Slight retrograde filling into the distal left V4 segment. Right PICA patent proximally. Left PICA not visualized. Basilar irregular but patent to its distal aspect without flow-limiting stenosis. Superior cerebral arteries patent proximally. Probable severe left SCA stenosis noted (series 1037, image 9). Both of the PCAs arise from the basilar artery. Moderate right P1 stenosis (series 1037, image 9). Additional moderate distal right P2 stenosis (series 1037, image 16). Additional atheromatous irregularity throughout the remaining PCAs bilaterally, which do remain patent to their distal aspects. No aneurysm.  IMPRESSION: MRI HEAD IMPRESSION: 1. 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 2. 6 mm subacute small vessel white matter infarct involving the right periatrial white matter. 3. Atrophy with severe chronic microvascular  ischemic disease. 4. Innumerable chronic micro hemorrhages throughout the brain, favored to be secondary to poorly controlled hypertension. MRA HEAD IMPRESSION: 1. Occlusion of the left vertebral artery. 2. Advanced atheromatous change throughout the intracranial circulation. Notable findings include severe proximal left M2 stenoses, severe left SCA stenosis, with moderate right P1 and P2 stenoses. Electronically Signed   By: Rise Mu M.D.   On: 05/02/2018 03:18     CBC Recent Labs  Lab 04/29/18 05/01/18 1743 05/01/18 1751 05/02/18 0331  WBC 7.2 7.4  --  10.2  HGB 14.4 15.9* 16.7* 14.7  HCT 45 51.6* 49.0* 47.7*  PLT 231 248  --  229  MCV  --  92.8  --  92.8  MCH  --  28.6  --  28.6  MCHC  --  30.8  --  30.8  RDW  --  15.2  --  15.3  LYMPHSABS  --  2.2  --   --   MONOABS  --  0.4  --   --   EOSABS  --  0.2  --   --   BASOSABS  --  0.1  --   --     Chemistries  Recent Labs  Lab 04/29/18 05/01/18 1743 05/01/18 1751 05/02/18 0331  NA 149* 151* 152* 151*  K 3.7 3.9 3.8 3.2*  CL  --  115* 113* 113*  CO2  --  25  --  24  GLUCOSE  --  105* 104* 97  BUN 18 16 18 14   CREATININE 0.5 0.78 0.60 0.73  CALCIUM  --  9.4  --  8.9  AST  --  23  --   --   ALT  --  19  --   --   ALKPHOS  --  69  --   --   BILITOT  --  1.2  --   --    ------------------------------------------------------------------------------------------------------------------ Recent Labs    05/02/18 0331  CHOL 161  HDL 36*  LDLCALC 102*  TRIG 115  CHOLHDL 4.5    Lab Results  Component Value Date   HGBA1C 5.5 05/02/2018   ------------------------------------------------------------------------------------------------------------------ No results for input(s): TSH, T4TOTAL, T3FREE, THYROIDAB in the last 72 hours.  Invalid input(s): FREET3 ------------------------------------------------------------------------------------------------------------------ No results for input(s): VITAMINB12,  FOLATE, FERRITIN, TIBC, IRON, RETICCTPCT in the last 72 hours.  Coagulation profile Recent Labs  Lab 05/01/18 1743  INR 1.13    No results for input(s): DDIMER in the last 72 hours.  Cardiac Enzymes Recent Labs  Lab 05/01/18 2044 05/02/18 0331 05/02/18 0731  TROPONINI <0.03 <0.03 <0.03   ------------------------------------------------------------------------------------------------------------------ No results found for: BNP  Shon Hale M.D on 05/03/2018 at 6:45 PM  Between 7am to 7pm - Pager - [phone]  After 7pm go to [email or web address] - password [email or web address]  [facility] -  Office  [phone]  Voice Recognition Reubin Milan dictation system was used to create this note, attempts have been made to correct errors. Please contact the author with questions and/or clarifications.

## 2018-05-03 NOTE — Clinical Social Work Note (Signed)
Clinical Social Work Assessment  Patient Details  Name: Morgan Sutton MRN: [id number] Date of Birth: 1946-04-08  Date of referral:  05/03/18               Reason for consult:  Discharge Planning                Permission sought to share information with:  Facility Medical sales representativeContact Representative, Family Supports Permission granted to share information::  No  Name::     Emergency planning/management officerybil  Agency::  [facility]  Relationship::  Sister  Contact Information:  [phone]  Housing/Transportation Living arrangements for the past 2 months:  Skilled Nursing Facility Source of Information:  Siblings Patient Interpreter Needed:  None Criminal Activity/Legal Involvement Pertinent to Current Situation/Hospitalization:  No - Comment as needed Significant Relationships:  Siblings Lives with:  Facility Resident Do you feel safe going back to the place where you live?  Yes Need for family participation in patient care:  Yes (Comment)  Care giving concerns:  CSW received consult for possible SNF placement at time of discharge. CSW spoke with patient's sister regarding discharge plan. [facility]. Patient's sister expressed understanding of plan and is agreeable to return to SNF placement at time of discharge. CSW to continue to follow and assist with discharge planning needs.   Social Worker assessment / plan:  CSW spoke with patient's sister concerning return to rehab.  Employment status:  Retired Health and safety inspectornsurance information:  Armed forces operational officerMedicare, Medicaid [place] Recommendations:  Skilled Nursing Facility Information / Referral to community resources:  Skilled Nursing Facility  Patient/Family's Response to care:  Patient's sister reported concerns about returning to [facility] but knows that any other long term care facility with Medicaid beds would have equal care. She wants them to get patient out of the bed more. She stated that she wishes she could take the patient home with but is not sure she  can at this time.   Patient/Family's Understanding of and Emotional Response to Diagnosis, Current Treatment, and Prognosis:  Patient/family is realistic regarding therapy needs and expressed being hopeful for improvement. Patient's sister expressed understanding of CSW role and discharge process as well as medical condition. No questions/concerns about plan or treatment.    Emotional Assessment Appearance:  Appears stated age Attitude/Demeanor/Rapport:  Unable to Assess Affect (typically observed):  Unable to Assess Orientation:  Oriented to Self Alcohol / Substance use:  Not Applicable Psych involvement (Current and /or in the community):  No (Comment)  Discharge Needs  Concerns to be addressed:  Care Coordination Readmission within the last 30 days:  No Current discharge risk:  None Barriers to Discharge:  Continued Medical Work up   Morgan Sutton, Theresia MajorsLCSWA 05/03/2018, 12:38 PM

## 2018-05-03 NOTE — Progress Notes (Signed)
SLP Cancellation Note  Patient Details Name: Morgan Sutton MRN: [id number] DOB: 03-05-1946   Cancelled treatment:     Attempted to see pt for ongoing dysphagia therapy and diet monitoring.  Pt was asleep on SLP arrival. Pt did not respond to cold washcloth on face. Pt briefly opened eyes when head of bed was elevated.  Pt would not orally accept PO trials of puree or thin liquid. RN notified.  RN reports good intake last night with family, but has decline POs today.  SLP will continue with current treatment plan when pt is able to participate.  Ondrea Dow E Marque Rademaker 05/03/2018, 12:37 PM

## 2018-05-03 NOTE — Progress Notes (Addendum)
STROKE TEAM PROGRESS NOTE   INTERVAL HISTORY RN is at the bedside.  No family present. Plans for palliative care meeting later today. Overall, no significant change. Most notable weakness in LLE, mute. Progressive decline over the past year led to recommendation to include palliative in goals of care. Pt at risk for recurrent stroke in setting of dementia.  Vitals:   05/02/18 2026 05/03/18 0005 05/03/18 0406 05/03/18 0808  BP: (!) 151/68 (!) 153/74 (!) 152/73 137/82  Pulse: (!) 50 (!) 48 (!) 49 (!) 45  Resp: 16 16 16 20   Temp: 98 F (36.7 C) 98.1 F (36.7 C) 97.8 F (36.6 C) 98 F (36.7 C)  TempSrc: Axillary Axillary Axillary Oral  SpO2: 97% 98% 97% 98%  Weight:        CBC:  Recent Labs  Lab 04/29/18 05/01/18 1743 05/01/18 1751 05/02/18 0331  WBC 7.2 7.4  --  10.2  NEUTROABS 5 4.6  --   --   HGB 14.4 15.9* 16.7* 14.7  HCT 45 51.6* 49.0* 47.7*  MCV  --  92.8  --  92.8  PLT 231 248  --  229    Basic Metabolic Panel:  Recent Labs  Lab 05/01/18 1743 05/01/18 1751 05/02/18 0331  NA 151* 152* 151*  K 3.9 3.8 3.2*  CL 115* 113* 113*  CO2 25  --  24  GLUCOSE 105* 104* 97  BUN 16 18 14   CREATININE 0.78 0.60 0.73  CALCIUM 9.4  --  8.9   Lipid Panel:     Component Value Date/Time   CHOL 161 05/02/2018 0331   TRIG 115 05/02/2018 0331   HDL 36 (L) 05/02/2018 0331   CHOLHDL 4.5 05/02/2018 0331   VLDL 23 05/02/2018 0331   LDLCALC 102 (H) 05/02/2018 0331   HgbA1c:  Lab Results  Component Value Date   HGBA1C 5.5 05/02/2018   Urine Drug Screen:     Component Value Date/Time   LABOPIA NONE DETECTED 05/01/2018 1748   COCAINSCRNUR NONE DETECTED 05/01/2018 1748   LABBENZ NONE DETECTED 05/01/2018 1748   AMPHETMU NONE DETECTED 05/01/2018 1748   THCU NONE DETECTED 05/01/2018 1748   LABBARB NONE DETECTED 05/01/2018 1748    Alcohol Level     Component Value Date/Time   ETH <10 05/01/2018 1745    IMAGING Ct Head Wo Contrast 05/01/2018 Atrophy with small vessel  chronic ischemic changes of deep cerebral white matter. Lacunar infarcts in BILATERAL basal ganglia and thalami, question mildly progressive in thalami especially RIGHT since prior exam.   Mr Brain Wo Contrast 05/02/2018 1. 6 mm acute ischemic nonhemorrhagic ventral LEFT pontine infarct. 2. 6 mm subacute small vessel white matter infarct involving the RIGHT periatrial white matter. 3. Atrophy with severe chronic microvascular ischemic disease. 4. Innumerable chronic micro hemorrhages throughout the brain, favored to be secondary to poorly controlled hypertension.   Mr Maxine Glenn Head Wo Contrast 05/02/2018 1. Occlusion of the left vertebral artery. 2. Advanced atheromatous change throughout the intracranial circulation. Notable findings include severe proximal left M2 stenoses, severe left SCA stenosis, with moderate right P1 and P2 stenoses.   2D Echocardiogram  - Left ventricle: The cavity size was normal. There was moderate concentric hypertrophy. Systolic function was normal. The estimated ejection fraction was in the range of 60% to 65%. Wall motion was normal; there were no regional wall motion abnormalities. Features are consistent with a pseudonormal left ventricular filling pattern, with concomitant abnormal relaxation and increased filling pressure (grade 2 diastolic dysfunction). Doppler  parameters are consistent with elevated ventricular end-diastolic filling pressure. - Aortic valve: There was no regurgitation. - Aortic root: The aortic root was normal in size. - Mitral valve: There was mild regurgitation. - Right ventricle: The cavity size was normal. Wall thickness was normal. Systolic function was normal. - Tricuspid valve: There was no regurgitation. - Pulmonic valve: There was no regurgitation. - Pulmonary arteries: Systolic pressure could not be accurately estimated. - Inferior vena cava: The vessel was normal in size. - Pericardium, extracardiac: There was no pericardial  effusion. Impressions:   There is highly echogenic echodensity in the right atrium measuring 20 x 16 mm. A TEE or a cardiac MRI is recommended for further evaluation.  Carotid Doppler   There is 1-39% bilateral ICA stenosis. Vertebral artery flow is antegrade.   PHYSICAL EXAM  i Constitutional: frail elderly Caucasian lady not in distress Psych: Affect appropriate to situation Eyes: No scleral injection HENT: No OP obstrucion Head: Normocephalic.  Cardiovascular: Normal rate and regular rhythm.  Respiratory: Effort normal, non-labored breathing Skin: WDI  Neuro: Mental Status: Patient is awake, alert, attemtps to speak but very dysarthric and cannot discern what she is saying Cranial Nerves: II: Visual Fields are full. Pupils are equal, round, and reactive to light.   III,IV, VI: EOMI without ptosis or diploplia.  V: Facial sensation is symmetric  VII: Facial movement appears to be slightly weak on the right VIII: hearing is intact to voice X: Uvula elevates symmetrically XI: Shoulder shrug is symmetric. XII: tongue is midline without atrophy or fasciculations.  Motor: Arms held up equally with no arm drift. 4/5 strength in the left arm, 3/5 left leg with decreased withdrawal to pain. right arm 4/5 strength, 4/5 R leg with brisk withdrawal.but limited effort and cooperation  wiggles toes bilaterally. Sensory: She endorses symmetric sensation Cerebellar: She does not perform   ASSESSMENT/PLAN [name] is a 72 y.o. female with history of previous stroke in 12/2017, HTN, HLD, PVD and baseline dementia who is bedbound presenting with R hemiparesis.   Stroke:  LEFT ventral pontine and R corona radiata infarcts secondary to small vessel disease vs undefined RA echodensity  CT head No acute stroke. Small vessel disease. Atrophy. B BG and thalamic lacunes, progressive since Feb.   MRI  Left ventral pontine infarct. R periatrial WM infarct. Small vessel disease. Atrophy.   Multiple chronic microhemorrhages throughout the brain (due to uncontrolled hypertension)  MRA  L VA occlusion. Advanced atherosclerosis:  L M2, L SCA, R P1 and P2  Carotid Doppler  B ICA 1-39% stenosis, VAs antegrade   2D Echo  EF 60-65%. RA highly echogenic echodensity 20x29mm  Consider TEE to further eval RA echodensity.   LDL 102  HgbA1c 5.5  Lovenox 40 mg sq daily for VTE prophylaxis  aspirin 81 mg daily and clopidogrel 75 mg daily prior to admission, now on aspirin 325 mg daily and clopidogrel 75 mg daily.  Okay to continue aspirin 81 mg and Plavix 75 mg daily at discharge x 3 weeks then one alone  Therapy recommendations:  SNF   Disposition:  Plan return to SNF (from SNF)  palliative care family conference today  Based on conference findings, consider TEE to further eval RA echodensity, consider tx w/ anticoagulation. Discussed with Dr. Marisa Severin. Will discuss with Dr. Pearlean Brownie  Dysphagia secondary to stroke  Failed bedside swallow  Passed MBS   On D1 thin liquids  Hypertension  Stable . Permissive hypertension (OK if < 220/120) but  gradually normalize in 5-7 days . Long-term BP goal normotensive  Hyperlipidemia  Home meds: Lipitor 20, resumed in hospital  LDL 102, goal < 70  Continue statin at discharge  Other Stroke Risk Factors  Advanced age  Hx stroke/TIA  12/2017 - residual L HP and dysphasia  Coronary artery disease  PVD - B iliac stent 2017  Other Active Problems  Baseline dementia w/ psychotic features  Hypokalemia 3.2  Hx Bell's Palsy - R face  Hx involuntary commitment - psychosis (behavior change, homicidal thoughts, delusions, paranoia)  Hospital day # 2  Annie MainSharon Biby, MSN, APRN, ANVP-BC, AGPCNP-BC Advanced Practice Stroke Nurse [facility] See Amion for Schedule & Pager information 05/03/2018 12:41 PM  I have personally examined this patient, reviewed notes, independently viewed imaging studies, participated in  medical decision making and plan of care.ROS completed by me personally and pertinent positives fully documented  I have made any additions or clarifications directly to the above note. Agree with note above.  The patient has presented with left brain stem and right white matter lacunar infarcts likely due to small vessel disease. Transthoracic echo shows her echo density in the right atrium possibly a clot which may need further evaluation with the cardiac MRI but given patient's poor neurological baseline I think it would be appropriate for us to have palliative care discussions with family and establishing goals of care prior to ordering more invasive tests. Greater than 50% and in this 25 minute visit was spent on counseling and coordination of care about strokes, dementia and answering questions  Delia HeadyPramod Sethi, MD Medical Director [facility] Pager: [phone] 05/03/2018 2:57 PM  To contact Stroke Continuity provider, please refer to [email or web address]. After hours, contact General Neurology

## 2018-05-03 NOTE — Plan of Care (Signed)
Pt has been repositioned several times today, she at well at lunch. Family had conference with palliative care.

## 2018-05-03 NOTE — Consult Note (Signed)
Consultation Note Date: 05/03/2018   Patient Name: Morgan Sutton  DOB: [birth year]  MRN: [id number]  Age / Sex: 72 y.o., female  PCP: Eunice Blase'Buch, Greta, PA-C Referring Physician: Shon HaleEmokpae, Courage, MD  Reason for Consultation: Establishing goals of care and Psychosocial/spiritual support  HPI/Patient Profile: 72 y.o. female   admitted on 05/01/2018 with past  medical history significant of HTN, HLD, CAD, CVA with residual left-sided weakness, dysphagia on pured diet, dementia, remote history of CAD, PVD with bilateral iliac stents in 2017; who presents from [facility] with complaints of weakness new right-sided.    IMPRESSION: MRI HEAD IMPRESSION:  1. 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 2. 6 mm subacute small vessel white matter infarct involving the right periatrial white matter. 3. Atrophy with severe chronic microvascular ischemic disease. 4. Innumerable chronic micro hemorrhages throughout the brain, favored to be secondary to poorly controlled hypertension.  MRA HEAD IMPRESSION:  1. Occlusion of the left vertebral artery. 2. Advanced atheromatous change throughout the intracranial circulation. Notable findings include severe proximal left M2 stenoses, severe left SCA stenosis, with moderate right P1 and P2 stenoses.  She has had continued physical, functional and cognitive decline over the past several years  Family face treatment option decisions, advanced directive decisions and anticipatory care needs   Clinical Assessment and Goals of Care:  This NP Lorinda CreedMary Larach reviewed medical records, received report from team, assessed the patient and then meet at the patient's bedside along with her family to include son/Greg, sister/Sybils and several other brothers   to discuss diagnosis, prognosis, GOC, EOL wishes disposition and options.  Concept of Hospice and  Palliative Care were discussed indetail.  A detailed discussion was had today regarding advanced directives.  Concepts specific to code status, artifical feeding and hydration, continued IV antibiotics and rehospitalization was had.  The difference between a aggressive medical intervention path  and a palliative comfort care path for this patient at this time was had.  Values and goals of care important to patient and family were attempted to be elicited.  Although family verbalize an understanding that time is limited 2/2 to significant brain disease/stroke and dementia they are unable at this time to put any limits on care interventions allowing for a more natural death.  MOST form introduced  Natural trajectory and expectations at EOL were discussed.  Questions and concerns addressed.   Family encouraged to call with questions or concerns.    PMT will continue to support holistically.   HCPOA    SUMMARY OF RECOMMENDATIONS    Family is open to all offered and available medical interventions to prolong life.     They would "most definitely" utilize artifical feeding "if that is what it takes, we won't let her starve to death"  They are hopeful for improvement and ongoing quality of life.  They are open to palliative and hospice services when eligible.  Code Status/Advance Care Planning:  DNR   Palliative Prophylaxis:   Aspiration, Bowel Regimen, Delirium Protocol, Frequent Pain Assessment and  Oral Care  Additional Recommendations (Limitations, Scope, Preferences):  Full Scope Treatment  Psycho-social/Spiritual:   Desire for further Chaplaincy support:yes  Additional Recommendations: Education on Hospice  Prognosis:   -unable to determine at this time   Discharge Planning: SNF when medically stable      Primary Diagnoses: Present on Admission: . Dehydration . Acute hypernatremia . Dementia . PAD (peripheral artery disease) (HCC) . Hypertension   I have  reviewed the medical record, interviewed the patient and family, and examined the patient. The following aspects are pertinent.  Past Medical History:  Diagnosis Date  . Cerebral infarction Feb 2019   . Dementia   . HLD (hyperlipidemia)   . Hypertension   . Peripheral vascular disease (HCC)   . Stroke [facility])    Social History   Socioeconomic History  . Marital status: Widowed    Spouse name: Not on file  . Number of children: Not on file  . Years of education: Not on file  . Highest education level: Not on file  Occupational History  . Not on file  Social Needs  . Financial resource strain: Not on file  . Food insecurity:    Worry: Not on file    Inability: Not on file  . Transportation needs:    Medical: Not on file    Non-medical: Not on file  Tobacco Use  . Smoking status: Former Games developer  . Smokeless tobacco: Never Used  Substance and Sexual Activity  . Alcohol use: No    Frequency: Never  . Drug use: No  . Sexual activity: Not on file  Lifestyle  . Physical activity:    Days per week: Not on file    Minutes per session: Not on file  . Stress: Not on file  Relationships  . Social connections:    Talks on phone: Not on file    Gets together: Not on file    Attends religious service: Not on file    Active member of club or organization: Not on file    Attends meetings of clubs or organizations: Not on file    Relationship status: Not on file  Other Topics Concern  . Not on file  Social History Narrative  . Not on file   Family History  Problem Relation Age of Onset  . Heart disease Father   . Dementia Mother    Scheduled Meds: . amLODipine  10 mg Oral Daily  . aspirin  300 mg Rectal Daily   Or  . aspirin  325 mg Oral Daily  . atorvastatin  20 mg Oral q1800  . clopidogrel  75 mg Oral Daily  . donepezil  10 mg Oral QHS  . enoxaparin (LOVENOX) injection  40 mg Subcutaneous Q24H  . mirtazapine  15 mg Oral QHS  . potassium chloride  40 mEq Oral Once  .  sodium chloride flush  3 mL Intravenous Q12H   Continuous Infusions: . cefTRIAXone (ROCEPHIN)  IV Stopped (05/02/18 2123)  . dextrose 75 mL/hr at 05/03/18 0411   PRN Meds:.acetaminophen **OR** acetaminophen, hydrALAZINE, ipratropium-albuterol Medications Prior to Admission:  Prior to Admission medications   Medication Sig Start Date End Date Taking? Authorizing Provider  acetaminophen (TYLENOL) 325 MG tablet Take 650 mg by mouth every 6 (six) hours as needed.    [provider]  Amino Acids-Protein Hydrolys (FEEDING SUPPLEMENT, PRO-STAT SUGAR FREE 64,) LIQD Take 30 mLs by mouth daily.    [provider]  amLODipine (NORVASC) 10 MG tablet Take  10 mg by mouth daily.    [provider]  aspirin EC 81 MG tablet Take 81 mg by mouth daily.    [provider]  atorvastatin (LIPITOR) 20 MG tablet Take 20 mg by mouth daily.    [provider]  cholecalciferol (VITAMIN D) 1000 units tablet Take 2,000 Units by mouth daily. Take 2 tablets to = 2000 units    [provider]  clopidogrel (PLAVIX) 75 MG tablet Take 75 mg by mouth daily.    [provider]  cyanocobalamin (,VITAMIN B-12,) 1000 MCG/ML injection Inject 1,000 mcg into the muscle every 30 (thirty) days. On or about the 12th of each month    [provider]  donepezil (ARICEPT) 10 MG tablet Take 10 mg by mouth at bedtime.    [provider]  hydrALAZINE (APRESOLINE) 50 MG tablet Take 1 tablet (50 mg total) by mouth every 8 (eight) hours. 01/25/18   Leroy Sea, MD  mirtazapine (REMERON) 15 MG tablet Take 15 mg by mouth at bedtime.    [provider]  Multiple Vitamins-Minerals (MULTIVITAMIN WITH MINERALS) tablet Take 1 tablet by mouth daily.    [provider]  Nutritional Supplements (NUTRITIONAL SUPPLEMENT PO) Take 1 each by mouth 2 (two) times daily. Magic Cup    [provider]   Allergies  Allergen Reactions  . Seroquel  [Quetiapine Fumarate] Other (See Comments)    Pt coded, per her sister   Review of Systems  Unable to perform ROS: Dementia    Physical Exam  Constitutional: She appears well-developed.  Cardiovascular: Normal rate, regular rhythm and normal heart sounds.  Pulmonary/Chest:  Decreased in bases  Neurological: She is alert.  Skin: Skin is warm and dry.    Vital Signs: BP 137/82 (BP Location: Right Arm)   Pulse (!) 45   Temp 98 F (36.7 C) (Oral)   Resp 20   Wt 68.1 kg (150 lb 2.1 oz)   SpO2 98%   BMI 24.23 kg/m  Pain Scale: PAINAD       SpO2: SpO2: 98 % O2 Device:SpO2: 98 % O2 Flow Rate: .   IO: Intake/output summary:   Intake/Output Summary (Last 24 hours) at 05/03/2018 1431 Last data filed at 05/03/2018 0900 Gross per 24 hour  Intake 1870 ml  Output 100 ml  Net 1770 ml    LBM: Last BM Date: (unknown-UTA) Baseline Weight: Weight: 68.1 kg (150 lb 2.1 oz) Most recent weight: Weight: 68.1 kg (150 lb 2.1 oz)     Palliative Assessment/Data:  30% at best   Discussed with Dr Mariea Clonts  Time In: 1345 Time Out: 1500 Time Total: 75 minutes Greater than 50%  of this time was spent counseling and coordinating care related to the above assessment and plan.  Signed by: Lorinda Creed, NP   Please contact Palliative Medicine Team phone at [phone] for questions and concerns.  For individual provider: See Loretha Stapler

## 2018-05-03 NOTE — Plan of Care (Signed)
Pt, family, and palliative care had meeting today.

## 2018-05-04 DIAGNOSIS — R1310 Dysphagia, unspecified: Secondary | ICD-10-CM

## 2018-05-04 LAB — BASIC METABOLIC PANEL
Anion gap: 7 (ref 5–15)
BUN: 5 mg/dL — ABNORMAL LOW (ref 6–20)
CHLORIDE: 111 mmol/L (ref 101–111)
CO2: 25 mmol/L (ref 22–32)
CREATININE: 0.63 mg/dL (ref 0.44–1.00)
Calcium: 8.8 mg/dL — ABNORMAL LOW (ref 8.9–10.3)
Glucose, Bld: 128 mg/dL — ABNORMAL HIGH (ref 65–99)
Potassium: 3 mmol/L — ABNORMAL LOW (ref 3.5–5.1)
SODIUM: 143 mmol/L (ref 135–145)

## 2018-05-04 MED ORDER — POTASSIUM CHLORIDE 20 MEQ/15ML (10%) PO SOLN
40.0000 meq | Freq: Once | ORAL | Status: AC
  Administered 2018-05-04: 40 meq via ORAL
  Filled 2018-05-04: qty 30

## 2018-05-04 NOTE — Progress Notes (Signed)
Patient ID: Morgan Sutton, female   DOB: [birth year], 72 y.o.   MRN: [id number]  This NP visited patient at the bedside as a follow up to  yesterday's GOCs meeting.  Sister/Morgan Sutton at bedside.  Continued conversation regarding current medical situation. Morgan Sutton verbalizes a very clear understanding of the current medical situation, long term poor prognosis and the limited options available for long term care.  However family as a whole are open to all offered and available medical interventions to prolong life, hoping for continued quality of life.  Morgan Sutton tells me she plans to continue to "do the most that she can to support her sister at this difficult time"  Plan is for TEE tomorrow to find source of clotting.  MOST form ntroduced  Ultimately plan is for return to SNF with Palliative or Hospice services which ever benefit best suits the situation on discharge.  Questions and concerns addressed   Discussed with Dr Josie DixonEmokepe    Total time spent on the unit was 35 minutes  Greater than 50% of the time was spent in counseling and coordination of care  Morgan CreedMary Sritha Chauncey NP  Palliative Medicine Team Team Phone # [phone] Pager [phone]

## 2018-05-04 NOTE — Care Management Important Message (Signed)
Important Message  Patient Details  Name: Morgan Sutton MRN: [id number] Date of Birth: 03/29/1946   Medicare Important Message Given:  No  Patient not able to sign due to illness/Unsigned copy left   Miku Brittinee Risk 05/04/2018, 2:16 PM

## 2018-05-04 NOTE — Progress Notes (Signed)
    [facility] has been requested to perform a transesophageal echocardiogram on 05/05/18 for Stroke.  After careful review of history and examination, the risks and benefits of transesophageal echocardiogram have been explained including risks of esophageal damage, perforation (1:10,000 risk), bleeding, pharyngeal hematoma as well as other potential complications associated with conscious sedation including aspiration, arrhythmia, respiratory failure and death. Alternatives to treatment were discussed, questions were answered. Patient/Sister (Cybil) is willing to proceed. Labs and vitals signs are stable. K+ low at 3.2, would recheck in the morning.   Laverda PageLindsay Roberts, NP-C 05/04/2018 1:26 PM

## 2018-05-04 NOTE — Progress Notes (Signed)
Patient Demographics:    Morgan Sutton, is a 72 y.o. female, DOB - 06/15/1946, XBJ:[id number]  Admit date - 05/01/2018   Admitting Physician Clydie Braun, MD  Outpatient Primary MD for the patient is O'Buch, Greta, PA-C  LOS - 3   Chief Complaint  Patient presents with  . Weakness        Subjective:    Morgan Sutton today has no fevers, no emesis,  No chest pain, no new concerns,  Dr Pearlean Brownie is at bedside  Assessment  & Plan :    Principal Problem:   Right sided weakness Active Problems:   Acute hypernatremia   History of CVA (cerebrovascular accident) Feb 2019   Hypertension   Dementia   PAD (peripheral artery disease) (HCC)   Dehydration   Palliative care by specialist   DNR (do not resuscitate)  IMPRESSION: MRI HEAD IMPRESSION:  1) 6 mm acute ischemic nonhemorrhagic ventral LEFT pontine infarct. 2) 6 mm subacute small vessel white matter infarct involving the right periatrial white matter. 3) Atrophy with severe chronic microvascular ischemic disease. 4) Innumerable chronic micro hemorrhages throughout the brain, favored to be secondary to poorly controlled hypertension.  MRA HEAD IMPRESSION:  1. Occlusion of the left vertebral artery. 2. Advanced atheromatous change throughout the intracranial circulation. Notable findings include severe proximal left M2 stenoses, severe left SCA stenosis, with moderate right P1 and P2 Stenoses.   2D Echo  EF 60-65%. RA highly echogenic echodensity 20x41mm  Consider TEE to further eval RA echodensity.      Brief summary 72 y.o. right-handed female with medical history significant of HTN, HLD, CAD, recurrent CVA with residual left-sided weakness, dysphagia on pured diet, dementia, remote history of CAD, PVD with bilateral iliac stents in 2017--patient was admitted from [facility] nursing home on 05/01/2018 with concerns about new onset  right-sided weakness, MRI brain however does not explain the new right-sided weakness which actually appears has improved significantly since admission (within 12 to 16 hours) raising concerns about possible TIA.  For TEE on 05/05/18   Plan:-  1)Stroke:  LEFT Ventral pontine and R corona radiata infarcts  secondary to small vessel disease source--- MRI brain confirmed Lt ventral pontine infarct. R periatrial WM infarct, MRA Head shows   L VA occlusion... D/w Neurology/Stroke Team, they recommend increasing aspirin to 325 mg and contiinuing Plavix 71 mg, LDL is 102 goal is less than 70, A1c is 5.5.  Echocardiogram suggests possible right atria echo genic mass, TEE recommended . ,  Noted artery Dopplers without hemodynamically significant stenosis.  Plan is for discharge with aspirin 81 mg and Plavix 75 mg for 3 weeks then Plavix alone after that from a stroke standpoint however patient may continue aspirin and Plavix due to PAD with previous stents, if TEE positive patient may be a candidate for DOAC, For TEE on 05/05/18  2)HTN-allow permissive hypertension given acute stroke, keep systolic blood pressure under 308 and diastolic blood pressure under 657 for the next 5 days also  3)PAD-status post prior bilateral iliac stenting 2017, continue aspirin, Plavix and Lipitor as above  4)Neuropsych-patient with dementia with behavioral disturbance and psychotic features, suspect some component of vascular dementia  5)FEN/dysphagia-patient failed swallow eval, currently n.p.o. pending modified barium swallow, prior  to admission patient was on pured diet  6)Social/Ethics-discussed with patient brother Fayrene Fearing at bedside, also discussed with patient's sister,   7)Hypokalemia-replace and recheck   Code Status : DNR   Disposition Plan  : SNF  Consults  :  Neuro/Stroke team   DVT Prophylaxis  :  Lovenox    Lab Results  Component Value Date   PLT 229 05/02/2018    Inpatient Medications  Scheduled  Meds: . amLODipine  10 mg Oral Daily  . aspirin  300 mg Rectal Daily   Or  . aspirin  325 mg Oral Daily  . atorvastatin  20 mg Oral q1800  . clopidogrel  75 mg Oral Daily  . donepezil  10 mg Oral QHS  . enoxaparin (LOVENOX) injection  40 mg Subcutaneous Q24H  . mirtazapine  15 mg Oral QHS  . potassium chloride  40 mEq Oral Once  . potassium chloride  40 mEq Oral Once  . sodium chloride flush  3 mL Intravenous Q12H   Continuous Infusions: . cefTRIAXone (ROCEPHIN)  IV Stopped (05/03/18 2147)  . dextrose 75 mL/hr at 05/04/18 1316   PRN Meds:.acetaminophen **OR** acetaminophen, hydrALAZINE, ipratropium-albuterol    Anti-infectives (From admission, onward)   Start     Dose/Rate Route Frequency Ordered Stop   05/02/18 2000  cefTRIAXone (ROCEPHIN) 1 g in sodium chloride 0.9 % 100 mL IVPB     1 g 200 mL/hr over 30 Minutes Intravenous Every 24 hours 05/01/18 1945     05/01/18 1915  cefTRIAXone (ROCEPHIN) 1 g in sodium chloride 0.9 % 100 mL IVPB     1 g 200 mL/hr over 30 Minutes Intravenous  Once 05/01/18 1909 05/01/18 2018        Objective:   Vitals:   05/04/18 0518 05/04/18 0545 05/04/18 0909 05/04/18 1428  BP: (!) 148/97  (!) 142/86 125/66  Pulse: 75  (!) 49 (!) 53  Resp: 18  20 20   Temp: 98.2 F (36.8 C)  98.1 F (36.7 C) 98 F (36.7 C)  TempSrc: Oral  Oral Oral  SpO2: 99%  98% 92%  Weight:  66.4 kg (146 lb 6.2 oz)      Wt Readings from Last 3 Encounters:  05/04/18 66.4 kg (146 lb 6.2 oz)  03/28/18 69.2 kg (152 lb 9.6 oz)  03/01/18 69.9 kg (154 lb)     Intake/Output Summary (Last 24 hours) at 05/04/2018 1921 Last data filed at 05/04/2018 1853 Gross per 24 hour  Intake 1098.75 ml  Output 1300 ml  Net -201.25 ml     Physical Exam  Gen:- Awake Alert, resting comfortably HEENT:- [place].AT, No sclera icterus Neck-Supple Neck,No JVD,.  Lungs-  CTAB , diminished in bases CV- S1, S2 normal Abd-  +ve B.Sounds, Abd Soft, No tenderness,    Extremity:- No  edema,    good pulses Psych-not very verbal,  but able to tell me her name  neuro- Arms Held up equally with no arm drift. 4/5 strength in the left arm, 3/5 left leg with decreased withdrawal to pain. right arm 4/5 strength, 4/5 R leg with brisk withdrawal.but limited effort and cooperation  wiggles toes bilaterally. Skin- Left Heel Necrotic unstageable Decubitus Ulcer/Pressure injury--- POA    Data Review:   Micro Results Recent Results (from the past 240 hour(s))  Urine Culture     Status: Abnormal (Preliminary result)   Collection Time: 05/01/18  6:06 PM  Result Value Ref Range Status   Specimen Description URINE, RANDOM  Final  Special Requests NONE  Final   Culture (A)  Final    >=100,000 COLONIES/mL ESCHERICHIA COLI >=100,000 COLONIES/mL UNIDENTIFIED ORGANISM    Report Status PENDING  Incomplete   Organism ID, Bacteria ESCHERICHIA COLI (A)  Final      Susceptibility   Escherichia coli - MIC*    AMPICILLIN <=2 SENSITIVE Sensitive     CEFAZOLIN <=4 SENSITIVE Sensitive     CEFTRIAXONE <=1 SENSITIVE Sensitive     CIPROFLOXACIN <=0.25 SENSITIVE Sensitive     GENTAMICIN <=1 SENSITIVE Sensitive     IMIPENEM <=0.25 SENSITIVE Sensitive     NITROFURANTOIN <=16 SENSITIVE Sensitive     TRIMETH/SULFA <=20 SENSITIVE Sensitive     AMPICILLIN/SULBACTAM <=2 SENSITIVE Sensitive     PIP/TAZO <=4 SENSITIVE Sensitive     Extended ESBL Value in next row Sensitive      NEGATIVEPerformed at [facility], [address] [place], [place] [address]    * >=100,000 COLONIES/mL ESCHERICHIA COLI  MRSA PCR Screening     Status: None   Collection Time: 05/01/18 11:48 PM  Result Value Ref Range Status   MRSA by PCR NEGATIVE NEGATIVE Final    Comment:        The GeneXpert MRSA Assay (FDA approved for NASAL specimens only), is one component of a comprehensive MRSA colonization surveillance program. It is not intended to diagnose MRSA infection nor to guide or monitor treatment for MRSA  infections. Performed at [facility], [address] [place], [place] [address]     Radiology Reports Ct Head Wo Contrast  Result Date: 05/01/2018 CLINICAL DATA:  Altered level of consciousness, weakness, last seen normal Thursday, today unable to use RIGHT hand, history of stroke with LEFT side deficit, dementia, hypertension EXAM: CT HEAD WITHOUT CONTRAST TECHNIQUE: Contiguous axial images were obtained from the base of the skull through the vertex without intravenous contrast. Sagittal and coronal MPR images reconstructed from axial data set. COMPARISON:  01/21/2018 FINDINGS: Brain: Generalized atrophy. Normal ventricular morphology. No midline shift or mass effect. Small vessel chronic ischemic changes of deep cerebral white matter. Multiple old infarcts involving the basal ganglia and thalami bilaterally, question mildly increased in thalami since prior exam particularly on RIGHT. No intracranial hemorrhage, mass lesion, evidence of acute infarction, or extra-axial fluid collection. Vascular: Atherosclerotic calcifications of the internal carotid arteries at the skull base. Skull: Intact Sinuses/Orbits: Clear Other: N/A IMPRESSION: Atrophy with small vessel chronic ischemic changes of deep cerebral white matter. Lacunar infarcts in BILATERAL basal ganglia and thalami, question mildly progressive in thalami especially RIGHT since prior exam. Electronically Signed   By: Ulyses Southward M.D.   On: 05/01/2018 18:29   Mr Brain Wo Contrast  Result Date: 05/02/2018 CLINICAL DATA:  Initial evaluation for acute right-sided weakness. EXAM: MRI HEAD WITHOUT CONTRAST MRA HEAD WITHOUT CONTRAST TECHNIQUE: Multiplanar, multiecho pulse sequences of the brain and surrounding structures were obtained without intravenous contrast. Angiographic images of the head were obtained using MRA technique without contrast. COMPARISON:  Prior CT from 05/01/2018 as well as previous MRI from 12/30/2017. FINDINGS: MRI HEAD  FINDINGS Brain: Advanced cerebral atrophy for age. Extensive patchy and confluent T2/FLAIR hyperintensity throughout the periventricular and deep white matter both cerebral hemispheres, consistent with chronic microvascular ischemic disease, fairly advanced in nature. Chronic microvascular ischemic changes extend into the brainstem and middle cerebellar peduncles as well. Multiple superimposed remote lacunar infarcts present within the hemispheric cerebral white matter, deep gray nuclei, and brainstem as well as the right  middle cerebellar peduncle. Innumerable chronic micro hemorrhages again seen, primarily clustered around the deep gray nuclei and cerebellum, favored to reflect sequelae of poorly controlled hypertension. 6 mm acute ischemic infarct present at the right ventral pons (series 5, image 53). Probable additional faint subacute 6 mm ischemia infarct within the right periatrial white matter (series 5, image 60). No associated hemorrhage or mass effect. No other evidence for acute or subacute ischemia. No mass lesion, midline shift or mass effect. Diffuse ventricular prominence related to global parenchymal volume loss of hydrocephalus. No extra-axial fluid collection. Pituitary gland within normal limits. Vascular: Abnormal flow void within the left V4 segment, likely occluded. The major intracranial vascular flow voids otherwise maintained. Skull and upper cervical spine: Craniocervical junction within normal limits. Upper cervical spine demonstrates no significant finding. Bone marrow signal intensity within normal limits. No scalp soft tissue abnormality. Sinuses/Orbits: Globes and orbital soft tissues within normal limits. Patient status post lens extraction on the left. Paranasal sinuses are clear. Small volume opacity within the mastoid air cells bilaterally, of doubtful significance. Inner ear structures normal. Other: None. MRA HEAD FINDINGS ANTERIOR CIRCULATION: Distal examination degraded by  motion artifact. Distal cervical segments of the internal carotid arteries are patent with antegrade flow. Petrous segments patent bilaterally. Scattered atheromatous irregularity within the cavernous/supraclinoid ICAs without high-grade stenosis, left worse than right. A1 segments patent bilaterally anterior communicating artery grossly normal. Anterior cerebral arteries irregular but patent to their distal aspects without definite stenosis. M1 segments mildly irregular but patent without high-grade stenosis. Right MCA bifurcates early. Severe proximal left M2 stenoses noted (series 1045, image 8). Advanced small vessel atheromatous irregularity throughout the MCA branches distally. POSTERIOR CIRCULATION: Visualized right vertebral artery patent to the vertebrobasilar junction without stenosis. Left V4 segment not visualized, occluded. Slight retrograde filling into the distal left V4 segment. Right PICA patent proximally. Left PICA not visualized. Basilar irregular but patent to its distal aspect without flow-limiting stenosis. Superior cerebral arteries patent proximally. Probable severe left SCA stenosis noted (series 1037, image 9). Both of the PCAs arise from the basilar artery. Moderate right P1 stenosis (series 1037, image 9). Additional moderate distal right P2 stenosis (series 1037, image 16). Additional atheromatous irregularity throughout the remaining PCAs bilaterally, which do remain patent to their distal aspects. No aneurysm. IMPRESSION: MRI HEAD IMPRESSION: 1. 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 2. 6 mm subacute small vessel white matter infarct involving the right periatrial white matter. 3. Atrophy with severe chronic microvascular ischemic disease. 4. Innumerable chronic micro hemorrhages throughout the brain, favored to be secondary to poorly controlled hypertension. MRA HEAD IMPRESSION: 1. Occlusion of the left vertebral artery. 2. Advanced atheromatous change  throughout the intracranial circulation. Notable findings include severe proximal left M2 stenoses, severe left SCA stenosis, with moderate right P1 and P2 stenoses. Electronically Signed   By: Rise Mu M.D.   On: 05/02/2018 03:18   Dg Chest Port 1 View  Result Date: 05/01/2018 CLINICAL DATA:  RIGHT-side weakness, last seen normal on Thursday EXAM: PORTABLE CHEST 1 VIEW COMPARISON:  Portable exam 1732 hours compared to 01/21/2018 FINDINGS: Normal heart size, mediastinal contours, and pulmonary vascularity. Mild central peribronchial thickening and minimal LEFT basilar atelectasis. Lungs otherwise clear. No pulmonary infiltrate, pleural effusion or pneumothorax. Bones demineralized. IMPRESSION: Mild bronchitic changes with minimal LEFT basilar atelectasis. Electronically Signed   By: Ulyses Southward M.D.   On: 05/01/2018 18:02   Dg Swallowing Func-speech Pathology  Result Date: 05/02/2018 Objective Swallowing Evaluation: Type  of Study: MBS-Modified Barium Swallow Study  Patient Details Name: DELANY STEURY MRN: [id number] Date of Birth: [birth year] Today's Date: 05/02/2018 Time: SLP Start Time (ACUTE ONLY): 1459 -SLP Stop Time (ACUTE ONLY): 1517 SLP Time Calculation (min) (ACUTE ONLY): 18 min Past Medical History: Past Medical History: Diagnosis Date . Cerebral infarction Feb 2019  . Dementia  . HLD (hyperlipidemia)  . Hypertension  . Peripheral vascular disease (HCC)  . Stroke [facility])  Past Surgical History: Past Surgical History: Procedure Laterality Date . CHOLECYSTECTOMY   . LEG SURGERY    Per sister patient shattered leg requiring surgery with placement of hardware . STENT PLACEMENT ILIAC ([facility] HX)   HPI: Pt is a 72 y.o. right-handed female with medical history significant for HTN, HLD, CAD, CVA with residual left-sided weakness, dysphagia on pured diet, dementia, remote history of CAD, PVD with bilateral iliac stents in 2017, who presents from [facility] with complaints of new right-sided weakness,  decreased speech output. MRI shows acute R pontine infarct. Prior BSE 01/22/18 recommended Dys 3 diet and thin liquids due to altered mentation with recommendation to advance back to regular textures as mentation returned to baseline.  Subjective: pt alert, cooperative, minimally conversant but following commands Assessment / Plan / Recommendation CHL IP CLINICAL IMPRESSIONS 05/02/2018 Clinical Impression Pt has a moderate oral dysphagia characterized by weak labial seal, lingual manipulation, and posterior propulsion. She has limited ability to masticate soft solids, holding boluses anteriorly with small munching pattern without fully forming a cohesive bolus. Minimal amounts of soft solids were able to reach the pharynx to be swallowed, with the rest of the residue manually removed by SLP despite Max cues for lingual sweep and pureed/liquid washes. She has baseline left-sided weakness as well as new right-sided weakness with residue pocketed bilaterally. Although still prolonged, her oral phase is more effective with purees and thin liquids, and when she swallows she has good airway protection (intermittent flash penetration only with thin liquids) and pharyngeal efficiency. Recommend starting Dys 1 diet and thin liquids with careful monitoring for oral residue. SLP Visit Diagnosis Dysphagia, oral phase (R13.11) Attention and concentration deficit following -- Frontal lobe and executive function deficit following -- Impact on safety and function Mild aspiration risk;Moderate aspiration risk   CHL IP TREATMENT RECOMMENDATION 05/02/2018 Treatment Recommendations Therapy as outlined in treatment plan below   Prognosis 05/02/2018 Prognosis for Safe Diet Advancement (No Data) Barriers to Reach Goals Cognitive deficits Barriers/Prognosis Comment -- CHL IP DIET RECOMMENDATION 05/02/2018 SLP Diet Recommendations Dysphagia 1 (Puree) solids;Thin liquid Liquid Administration via Straw Medication Administration Crushed with puree  Compensations Minimize environmental distractions;Slow rate;Small sips/bites;Lingual sweep for clearance of pocketing;Monitor for anterior loss Postural Changes Seated upright at 90 degrees   CHL IP OTHER RECOMMENDATIONS 05/02/2018 Recommended Consults -- Oral Care Recommendations Oral care BID Other Recommendations Have oral suction available   CHL IP FOLLOW UP RECOMMENDATIONS 05/02/2018 Follow up Recommendations Skilled Nursing facility   [facility] IP FREQUENCY AND DURATION 05/02/2018 Speech Therapy Frequency (ACUTE ONLY) min 2x/week Treatment Duration 2 weeks      CHL IP ORAL PHASE 05/02/2018 Oral Phase Impaired Oral - Pudding Teaspoon -- Oral - Pudding Cup -- Oral - Honey Teaspoon -- Oral - Honey Cup -- Oral - Nectar Teaspoon -- Oral - Nectar Cup -- Oral - Nectar Straw -- Oral - Thin Teaspoon -- Oral - Thin Cup -- Oral - Thin Straw Weak lingual manipulation;Delayed oral transit Oral - Puree Weak lingual manipulation;Delayed oral transit;Right pocketing in lateral sulci;Left pocketing in  lateral sulci;Lingual/palatal residue Oral - Mech Soft Weak lingual manipulation;Delayed oral transit;Right pocketing in lateral sulci;Left pocketing in lateral sulci;Lingual/palatal residue;Impaired mastication;Pocketing in anterior sulcus Oral - Regular -- Oral - Multi-Consistency -- Oral - Pill -- Oral Phase - Comment --  CHL IP PHARYNGEAL PHASE 05/02/2018 Pharyngeal Phase WFL Pharyngeal- Pudding Teaspoon -- Pharyngeal -- Pharyngeal- Pudding Cup -- Pharyngeal -- Pharyngeal- Honey Teaspoon -- Pharyngeal -- Pharyngeal- Honey Cup -- Pharyngeal -- Pharyngeal- Nectar Teaspoon -- Pharyngeal -- Pharyngeal- Nectar Cup -- Pharyngeal -- Pharyngeal- Nectar Straw -- Pharyngeal -- Pharyngeal- Thin Teaspoon -- Pharyngeal -- Pharyngeal- Thin Cup -- Pharyngeal -- Pharyngeal- Thin Straw -- Pharyngeal -- Pharyngeal- Puree -- Pharyngeal -- Pharyngeal- Mechanical Soft -- Pharyngeal -- Pharyngeal- Regular -- Pharyngeal -- Pharyngeal- Multi-consistency --  Pharyngeal -- Pharyngeal- Pill -- Pharyngeal -- Pharyngeal Comment --  CHL IP CERVICAL ESOPHAGEAL PHASE 05/02/2018 Cervical Esophageal Phase WFL Pudding Teaspoon -- Pudding Cup -- Honey Teaspoon -- Honey Cup -- Nectar Teaspoon -- Nectar Cup -- Nectar Straw -- Thin Teaspoon -- Thin Cup -- Thin Straw -- Puree -- Mechanical Soft -- Regular -- Multi-consistency -- Pill -- Cervical Esophageal Comment -- No flowsheet data found. Maxcine Hamaiewonsky, Laura 05/02/2018, 4:01 PM  Maxcine HamLaura Paiewonsky, M.A. CCC-SLP [phone]             Mr Shirlee LatchMra Head Wo Contrast  Result Date: 05/02/2018 CLINICAL DATA:  Initial evaluation for acute right-sided weakness. EXAM: MRI HEAD WITHOUT CONTRAST MRA HEAD WITHOUT CONTRAST TECHNIQUE: Multiplanar, multiecho pulse sequences of the brain and surrounding structures were obtained without intravenous contrast. Angiographic images of the head were obtained using MRA technique without contrast. COMPARISON:  Prior CT from 05/01/2018 as well as previous MRI from 12/30/2017. FINDINGS: MRI HEAD FINDINGS Brain: Advanced cerebral atrophy for age. Extensive patchy and confluent T2/FLAIR hyperintensity throughout the periventricular and deep white matter both cerebral hemispheres, consistent with chronic microvascular ischemic disease, fairly advanced in nature. Chronic microvascular ischemic changes extend into the brainstem and middle cerebellar peduncles as well. Multiple superimposed remote lacunar infarcts present within the hemispheric cerebral white matter, deep gray nuclei, and brainstem as well as the right middle cerebellar peduncle. Innumerable chronic micro hemorrhages again seen, primarily clustered around the deep gray nuclei and cerebellum, favored to reflect sequelae of poorly controlled hypertension. 6 mm acute ischemic infarct present at the right ventral pons (series 5, image 53). Probable additional faint subacute 6 mm ischemia infarct within the right periatrial white matter (series 5, image  60). No associated hemorrhage or mass effect. No other evidence for acute or subacute ischemia. No mass lesion, midline shift or mass effect. Diffuse ventricular prominence related to global parenchymal volume loss of hydrocephalus. No extra-axial fluid collection. Pituitary gland within normal limits. Vascular: Abnormal flow void within the left V4 segment, likely occluded. The major intracranial vascular flow voids otherwise maintained. Skull and upper cervical spine: Craniocervical junction within normal limits. Upper cervical spine demonstrates no significant finding. Bone marrow signal intensity within normal limits. No scalp soft tissue abnormality. Sinuses/Orbits: Globes and orbital soft tissues within normal limits. Patient status post lens extraction on the left. Paranasal sinuses are clear. Small volume opacity within the mastoid air cells bilaterally, of doubtful significance. Inner ear structures normal. Other: None. MRA HEAD FINDINGS ANTERIOR CIRCULATION: Distal examination degraded by motion artifact. Distal cervical segments of the internal carotid arteries are patent with antegrade flow. Petrous segments patent bilaterally. Scattered atheromatous irregularity within the cavernous/supraclinoid ICAs without high-grade stenosis, left worse than right. A1 segments patent bilaterally anterior  communicating artery grossly normal. Anterior cerebral arteries irregular but patent to their distal aspects without definite stenosis. M1 segments mildly irregular but patent without high-grade stenosis. Right MCA bifurcates early. Severe proximal left M2 stenoses noted (series 1045, image 8). Advanced small vessel atheromatous irregularity throughout the MCA branches distally. POSTERIOR CIRCULATION: Visualized right vertebral artery patent to the vertebrobasilar junction without stenosis. Left V4 segment not visualized, occluded. Slight retrograde filling into the distal left V4 segment. Right PICA patent  proximally. Left PICA not visualized. Basilar irregular but patent to its distal aspect without flow-limiting stenosis. Superior cerebral arteries patent proximally. Probable severe left SCA stenosis noted (series 1037, image 9). Both of the PCAs arise from the basilar artery. Moderate right P1 stenosis (series 1037, image 9). Additional moderate distal right P2 stenosis (series 1037, image 16). Additional atheromatous irregularity throughout the remaining PCAs bilaterally, which do remain patent to their distal aspects. No aneurysm. IMPRESSION: MRI HEAD IMPRESSION: 1. 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 2. 6 mm subacute small vessel white matter infarct involving the right periatrial white matter. 3. Atrophy with severe chronic microvascular ischemic disease. 4. Innumerable chronic micro hemorrhages throughout the brain, favored to be secondary to poorly controlled hypertension. MRA HEAD IMPRESSION: 1. Occlusion of the left vertebral artery. 2. Advanced atheromatous change throughout the intracranial circulation. Notable findings include severe proximal left M2 stenoses, severe left SCA stenosis, with moderate right P1 and P2 stenoses. Electronically Signed   By: Rise Mu M.D.   On: 05/02/2018 03:18     CBC Recent Labs  Lab 04/29/18 05/01/18 1743 05/01/18 1751 05/02/18 0331  WBC 7.2 7.4  --  10.2  HGB 14.4 15.9* 16.7* 14.7  HCT 45 51.6* 49.0* 47.7*  PLT 231 248  --  229  MCV  --  92.8  --  92.8  MCH  --  28.6  --  28.6  MCHC  --  30.8  --  30.8  RDW  --  15.2  --  15.3  LYMPHSABS  --  2.2  --   --   MONOABS  --  0.4  --   --   EOSABS  --  0.2  --   --   BASOSABS  --  0.1  --   --     Chemistries  Recent Labs  Lab 04/29/18 05/01/18 1743 05/01/18 1751 05/02/18 0331 05/04/18 1616  NA 149* 151* 152* 151* 143  K 3.7 3.9 3.8 3.2* 3.0*  CL  --  115* 113* 113* 111  CO2  --  25  --  24 25  GLUCOSE  --  105* 104* 97 128*  BUN 18 16 18 14  5*   CREATININE 0.5 0.78 0.60 0.73 0.63  CALCIUM  --  9.4  --  8.9 8.8*  AST  --  23  --   --   --   ALT  --  19  --   --   --   ALKPHOS  --  69  --   --   --   BILITOT  --  1.2  --   --   --    ------------------------------------------------------------------------------------------------------------------ Recent Labs    05/02/18 0331  CHOL 161  HDL 36*  LDLCALC 102*  TRIG 115  CHOLHDL 4.5    Lab Results  Component Value Date   HGBA1C 5.5 05/02/2018   ------------------------------------------------------------------------------------------------------------------ No results for input(s): TSH, T4TOTAL, T3FREE, THYROIDAB in the last 72 hours.  Invalid input(s): FREET3 ------------------------------------------------------------------------------------------------------------------  No results for input(s): VITAMINB12, FOLATE, FERRITIN, TIBC, IRON, RETICCTPCT in the last 72 hours.  Coagulation profile Recent Labs  Lab 05/01/18 1743  INR 1.13    No results for input(s): DDIMER in the last 72 hours.  Cardiac Enzymes Recent Labs  Lab 05/01/18 2044 05/02/18 0331 05/02/18 0731  TROPONINI <0.03 <0.03 <0.03   ------------------------------------------------------------------------------------------------------------------ No results found for: BNP  Shon Hale M.D on 05/04/2018 at 7:21 PM  Between 7am to 7pm - Pager - [phone]  After 7pm go to [email or web address] - password [email or web address]  [facility] -  Office  [phone]  Voice Recognition Reubin Milan dictation system was used to create this note, attempts have been made to correct errors. Please contact the author with questions and/or clarifications.

## 2018-05-04 NOTE — Progress Notes (Addendum)
STROKE TEAM PROGRESS NOTE   INTERVAL HISTORY RN is at the bedside.  No family present. Plans for palliative care meeting later today. Overall, no significant change. Most notable weakness in LLE, mute. Progressive decline over the past year led to recommendation to include palliative in goals of care. Pt at risk for recurrent stroke in setting of dementia.  Vitals:   05/04/18 0157 05/04/18 0518 05/04/18 0545 05/04/18 0909  BP: 138/65 (!) 148/97  (!) 142/86  Pulse: (!) 59 75  (!) 49  Resp: 18 18  20   Temp: 98.2 F (36.8 C) 98.2 F (36.8 C)  98.1 F (36.7 C)  TempSrc: Oral Oral  Oral  SpO2: 98% 99%  98%  Weight:   146 lb 6.2 oz (66.4 kg)     CBC:  Recent Labs  Lab 04/29/18 05/01/18 1743 05/01/18 1751 05/02/18 0331  WBC 7.2 7.4  --  10.2  NEUTROABS 5 4.6  --   --   HGB 14.4 15.9* 16.7* 14.7  HCT 45 51.6* 49.0* 47.7*  MCV  --  92.8  --  92.8  PLT 231 248  --  229    Basic Metabolic Panel:  Recent Labs  Lab 05/01/18 1743 05/01/18 1751 05/02/18 0331  NA 151* 152* 151*  K 3.9 3.8 3.2*  CL 115* 113* 113*  CO2 25  --  24  GLUCOSE 105* 104* 97  BUN 16 18 14   CREATININE 0.78 0.60 0.73  CALCIUM 9.4  --  8.9   Lipid Panel:     Component Value Date/Time   CHOL 161 05/02/2018 0331   TRIG 115 05/02/2018 0331   HDL 36 (L) 05/02/2018 0331   CHOLHDL 4.5 05/02/2018 0331   VLDL 23 05/02/2018 0331   LDLCALC 102 (H) 05/02/2018 0331   HgbA1c:  Lab Results  Component Value Date   HGBA1C 5.5 05/02/2018   Urine Drug Screen:     Component Value Date/Time   LABOPIA NONE DETECTED 05/01/2018 1748   COCAINSCRNUR NONE DETECTED 05/01/2018 1748   LABBENZ NONE DETECTED 05/01/2018 1748   AMPHETMU NONE DETECTED 05/01/2018 1748   THCU NONE DETECTED 05/01/2018 1748   LABBARB NONE DETECTED 05/01/2018 1748    Alcohol Level     Component Value Date/Time   ETH <10 05/01/2018 1745    IMAGING Ct Head Wo Contrast 05/01/2018 Atrophy with small vessel chronic ischemic changes of deep  cerebral white matter. Lacunar infarcts in BILATERAL basal ganglia and thalami, question mildly progressive in thalami especially RIGHT since prior exam.   Mr Brain Wo Contrast 05/02/2018 1. 6 mm acute ischemic nonhemorrhagic ventral LEFT pontine infarct. 2. 6 mm subacute small vessel white matter infarct involving the RIGHT periatrial white matter. 3. Atrophy with severe chronic microvascular ischemic disease. 4. Innumerable chronic micro hemorrhages throughout the brain, favored to be secondary to poorly controlled hypertension.   Mr Maxine Glenn Head Wo Contrast 05/02/2018 1. Occlusion of the left vertebral artery. 2. Advanced atheromatous change throughout the intracranial circulation. Notable findings include severe proximal left M2 stenoses, severe left SCA stenosis, with moderate right P1 and P2 stenoses.   2D Echocardiogram  - Left ventricle: The cavity size was normal. There was moderate concentric hypertrophy. Systolic function was normal. The estimated ejection fraction was in the range of 60% to 65%. Wall motion was normal; there were no regional wall motion abnormalities. Features are consistent with a pseudonormal left ventricular filling pattern, with concomitant abnormal relaxation and increased filling pressure (grade 2 diastolic dysfunction). Doppler parameters  are consistent with elevated ventricular end-diastolic filling pressure. - Aortic valve: There was no regurgitation. - Aortic root: The aortic root was normal in size. - Mitral valve: There was mild regurgitation. - Right ventricle: The cavity size was normal. Wall thickness was normal. Systolic function was normal. - Tricuspid valve: There was no regurgitation. - Pulmonic valve: There was no regurgitation. - Pulmonary arteries: Systolic pressure could not be accurately estimated. - Inferior vena cava: The vessel was normal in size. - Pericardium, extracardiac: There was no pericardial effusion. Impressions:   There is highly  echogenic echodensity in the right atrium measuring 20 x 16 mm. A TEE or a cardiac MRI is recommended for further evaluation.  Carotid Doppler   There is 1-39% bilateral ICA stenosis. Vertebral artery flow is antegrade.   PHYSICAL EXAM  i Constitutional: frail elderly Caucasian lady not in distress Psych: Affect appropriate to situation Eyes: No scleral injection HENT: No OP obstrucion Head: Normocephalic.  Cardiovascular: Normal rate and regular rhythm.  Respiratory: Effort normal, non-labored breathing Skin: WDI  Neuro: Mental Status: Patient is awake, alert, attemtps to speak but very dysarthric and cannot discern what she is saying Cranial Nerves: II: Visual Fields are full. Pupils are equal, round, and reactive to light.   III,IV, VI: EOMI without ptosis or diploplia.  V: Facial sensation is symmetric  VII: Facial movement appears to be slightly weak on the right VIII: hearing is intact to voice X: Uvula elevates symmetrically XI: Shoulder shrug is symmetric. XII: tongue is midline without atrophy or fasciculations.  Motor: Arms held up equally with no arm drift. 4/5 strength in the left arm, 4/5 left leg with decreased withdrawal to pain. right arm 4/5 strength, 4/5 R leg with brisk withdrawal.but limited effort and cooperation  wiggles toes bilaterally. Sensory: She endorses symmetric sensation Cerebellar: She does not perform   ASSESSMENT/PLAN Ms. Tajai Dionisio PaschalJ Carelli is a 72 y.o. female with history of previous stroke in 12/2017, HTN, HLD, PVD and baseline dementia who is bedbound presenting with R hemiparesis.   Stroke:  LEFT ventral pontine and R corona radiata infarcts secondary to small vessel disease vs undefined RA echodensity  CT head No acute stroke. Small vessel disease. Atrophy. B BG and thalamic lacunes, progressive since Feb.   MRI  Left ventral pontine infarct. R periatrial WM infarct. Small vessel disease. Atrophy.  Multiple chronic microhemorrhages  throughout the brain (due to uncontrolled hypertension)  MRA  L VA occlusion. Advanced atherosclerosis:  L M2, L SCA, R P1 and P2  Carotid Doppler  B ICA 1-39% stenosis, VAs antegrade   2D Echo  EF 60-65%. RA highly echogenic echodensity 20x4516mm  Consider TEE to further eval RA echodensity.   LDL 102  HgbA1c 5.5  Lovenox 40 mg sq daily for VTE prophylaxis  aspirin 81 mg daily and clopidogrel 75 mg daily prior to admission, now on aspirin 325 mg daily and clopidogrel 75 mg daily.  Okay to continue aspirin 81 mg and Plavix 75 mg daily at discharge x 3 weeks then one alone  Therapy recommendations:  SNF   Disposition:  Plan return to SNF (from SNF)  palliative care family conference today  Based on conference findings, consider TEE to further eval RA echodensity, consider tx w/ anticoagulation. Discussed with Dr. Marisa Severinourage. Will discuss with Dr. Pearlean BrownieSethi  Dysphagia secondary to stroke  Failed bedside swallow  Passed MBS   On D1 thin liquids  Hypertension  Stable . Permissive hypertension (OK if < 220/120) but gradually  normalize in 5-7 days . Long-term BP goal normotensive  Hyperlipidemia  Home meds: Lipitor 20, resumed in hospital  LDL 102, goal < 70  Continue statin at discharge  Other Stroke Risk Factors  Advanced age  Hx stroke/TIA  12/2017 - residual L HP and dysphasia  Coronary artery disease  PVD - B iliac stent 2017  Other Active Problems  Baseline dementia w/ psychotic features  Hypokalemia 3.2  Hx Bell's Palsy - R face  Hx involuntary commitment - psychosis (behavior change, homicidal thoughts, delusions, paranoia)  Hospital day # 3   The patient has significant dementia at baseline but not much physical deficits from her small strokes. Transthoracic echo has shown a right atrial echodensity and family wants to pursue aggressive workup and anticoagulation if deemed necessary. Recommend transesophageal echocardiogram to evaluate the right atrial  echodensity further. Family not available at the bedside. Discussed with Dr. Marisa Severin Greater than 50% and in this 25 minute visit was spent on counseling and coordination of care about strokes, dementia and answering questions  Delia Heady, MD Medical Director [facility] Pager: [phone] 05/04/2018 1:10 PM  To contact Stroke Continuity provider, please refer to [email or web address]. After hours, contact General Neurology

## 2018-05-04 NOTE — Plan of Care (Signed)
  Problem: Clinical Measurements: Goal: Ability to maintain clinical measurements within normal limits will improve Outcome: Progressing Goal: Will remain free from infection Outcome: Progressing Goal: Diagnostic test results will improve Outcome: Progressing Goal: Cardiovascular complication will be avoided Outcome: Progressing   Problem: Coping: Goal: Level of anxiety will decrease Outcome: Progressing   Problem: Elimination: Goal: Will not experience complications related to bowel motility Outcome: Progressing Goal: Will not experience complications related to urinary retention Outcome: Progressing   Problem: Pain Managment: Goal: General experience of comfort will improve Outcome: Progressing   Problem: Safety: Goal: Ability to remain free from injury will improve Outcome: Progressing   Problem: Skin Integrity: Goal: Risk for impaired skin integrity will decrease Outcome: Progressing   Problem: Nutrition: Goal: Risk of aspiration will decrease Outcome: Progressing   Problem: Ischemic Stroke/TIA Tissue Perfusion: Goal: Complications of ischemic stroke/TIA will be minimized Outcome: Progressing   Problem: Education: Goal: Knowledge of General Education information will improve Outcome: Not Progressing   Problem: Health Behavior/Discharge Planning: Goal: Ability to manage health-related needs will improve Outcome: Not Progressing   Problem: Activity: Goal: Risk for activity intolerance will decrease Outcome: Not Progressing   Problem: Nutrition: Goal: Adequate nutrition will be maintained Outcome: Not Progressing   Problem: Health Behavior/Discharge Planning: Goal: Ability to manage health-related needs will improve Outcome: Not Progressing   Problem: Self-Care: Goal: Ability to communicate needs accurately will improve Outcome: Not Progressing

## 2018-05-05 ENCOUNTER — Encounter (HOSPITAL_COMMUNITY): Payer: Self-pay | Admitting: *Deleted

## 2018-05-05 ENCOUNTER — Encounter (HOSPITAL_COMMUNITY)
Admission: EM | Disposition: A | Discharge status: Skilled Nursing Facility | Payer: Self-pay | Source: Skilled Nursing Facility | Attending: Family Medicine

## 2018-05-05 ENCOUNTER — Inpatient Hospital Stay (HOSPITAL_COMMUNITY): Payer: Medicare Other

## 2018-05-05 DIAGNOSIS — I6389 Other cerebral infarction: Secondary | ICD-10-CM

## 2018-05-05 DIAGNOSIS — R1310 Dysphagia, unspecified: Secondary | ICD-10-CM

## 2018-05-05 HISTORY — PX: TEE WITHOUT CARDIOVERSION: SHX5443

## 2018-05-05 LAB — BASIC METABOLIC PANEL
Anion gap: 11 (ref 5–15)
CHLORIDE: 110 mmol/L (ref 101–111)
CO2: 22 mmol/L (ref 22–32)
Calcium: 8.7 mg/dL — ABNORMAL LOW (ref 8.9–10.3)
Creatinine, Ser: 0.69 mg/dL (ref 0.44–1.00)
GFR calc Af Amer: >60 mL/min (ref >60)
GFR calc non Af Amer: >60 mL/min (ref >60)
GLUCOSE: 112 mg/dL — AB (ref 65–99)
POTASSIUM: 3.7 mmol/L (ref 3.5–5.1)
SODIUM: 143 mmol/L (ref 135–145)

## 2018-05-05 LAB — URINE CULTURE

## 2018-05-05 LAB — CBC
HEMATOCRIT: 43.5 % (ref 36.0–46.0)
Hemoglobin: 13.8 g/dL (ref 12.0–15.0)
MCH: 28.7 pg (ref 26.0–34.0)
MCHC: 31.7 g/dL (ref 30.0–36.0)
MCV: 90.4 fL (ref 78.0–100.0)
Platelets: 183 10*3/uL (ref 150–400)
RBC: 4.81 MIL/uL (ref 3.87–5.11)
RDW: 15 % (ref 11.5–15.5)
WBC: 8.3 10*3/uL (ref 4.0–10.5)

## 2018-05-05 SURGERY — ECHOCARDIOGRAM, TRANSESOPHAGEAL
Anesthesia: Moderate Sedation

## 2018-05-05 MED ORDER — SODIUM CHLORIDE BACTERIOSTATIC 0.9 % IJ SOLN
INTRAMUSCULAR | Status: DC | PRN
  Administered 2018-05-05: 9 mL via INTRAVENOUS

## 2018-05-05 MED ORDER — MIDAZOLAM HCL 10 MG/2ML IJ SOLN
INTRAMUSCULAR | Status: DC | PRN
  Administered 2018-05-05 (×2): 2 mg via INTRAVENOUS

## 2018-05-05 MED ORDER — BUTAMBEN-TETRACAINE-BENZOCAINE 2-2-14 % EX AERO
INHALATION_SPRAY | CUTANEOUS | Status: DC | PRN
  Administered 2018-05-05: 2 via TOPICAL

## 2018-05-05 MED ORDER — FENTANYL CITRATE (PF) 100 MCG/2ML IJ SOLN
INTRAMUSCULAR | Status: DC | PRN
  Administered 2018-05-05 (×2): 25 ug via INTRAVENOUS

## 2018-05-05 MED ORDER — FENTANYL CITRATE (PF) 100 MCG/2ML IJ SOLN
INTRAMUSCULAR | Status: AC
  Filled 2018-05-05: qty 2

## 2018-05-05 MED ORDER — MIDAZOLAM HCL 5 MG/ML IJ SOLN
INTRAMUSCULAR | Status: AC
  Filled 2018-05-05: qty 2

## 2018-05-05 MED ORDER — SODIUM CHLORIDE 0.9 % IV SOLN: INTRAVENOUS | Status: DC

## 2018-05-05 NOTE — Interval H&P Note (Signed)
History and Physical Interval Note:  05/05/2018 12:57 PM  Morgan Sutton  has presented today for surgery, with the diagnosis of STROKE  The various methods of treatment have been discussed with the patient and family. After consideration of risks, benefits and other options for treatment, the patient has consented to  Procedure(s): TRANSESOPHAGEAL ECHOCARDIOGRAM (TEE) (N/A) as a surgical intervention .  The patient's history has been reviewed, patient examined, no change in status, stable for surgery.  I have reviewed the patient's chart and labs.  Questions were answered to the patient's satisfaction.     Broughton Eppinger

## 2018-05-05 NOTE — Progress Notes (Signed)
PT Cancellation Note  Patient Details Name: Norval Mortonris J Suares MRN: [id number] DOB: 1946/07/21   Cancelled Treatment:    Reason Eval/Treat Not Completed: Patient at procedure or test/unavailable. Pt at TEE. Will check back as time allows.  Kallie LocksHannah Malvika Tung, PTA Pager [phone] Acute Rehab   Sheral ApleyHannah E Damone Fancher 05/05/2018, 2:22 PM

## 2018-05-05 NOTE — Progress Notes (Signed)
  Echocardiogram Echocardiogram Transesophageal has been performed.  Morgan Sutton [name] [name] 05/05/2018, 2:13 PM

## 2018-05-05 NOTE — Progress Notes (Signed)
SLP Cancellation Note  Patient Details Name: Morgan Sutton MRN: [id number] DOB: 12/10/1945   Cancelled treatment:       Reason Eval/Treat Not Completed: Patient at procedure or test/unavailable. Pt at TEE. Will check back as time allows.     Blenda MountsCouture, Trevonn Hallum Laurice 05/05/2018, 2:30 PM

## 2018-05-05 NOTE — Progress Notes (Signed)
Patient Demographics:    Morgan Sutton, is a 72 y.o. female, DOB - 10-Jul-1946, ZOX:[id number]  Admit date - 05/01/2018   Admitting Physician Morgan Braunondell A Smith, MD  Outpatient Primary MD for the patient is O'Buch, Greta, PA-C  LOS - 4   Chief Complaint  Patient presents with  . Weakness        Subjective:    Morgan Sutton today has no fevers, no emesis,  No chest pain, no new concerns,    Assessment  & Plan :    Principal Problem:   Right sided weakness Active Problems:   Acute hypernatremia   History of CVA (cerebrovascular accident) Feb 2019   Hypertension   Dementia   PAD (peripheral artery disease) (HCC)   Dehydration   Palliative care by specialist   DNR (do not resuscitate)   Dysphagia  IMPRESSION: MRI HEAD IMPRESSION:  1) 6 mm acute ischemic nonhemorrhagic ventral LEFT pontine infarct. 2) 6 mm subacute small vessel white matter infarct involving the right periatrial white matter. 3) Atrophy with severe chronic microvascular ischemic disease. 4) Innumerable chronic micro hemorrhages throughout the brain, favored to be secondary to poorly controlled hypertension.  MRA HEAD IMPRESSION:  1. Occlusion of the left vertebral artery. 2. Advanced atheromatous change throughout the intracranial circulation. Notable findings include severe proximal left M2 stenoses, severe left SCA stenosis, with moderate right P1 and P2 Stenoses.   2D Echo  EF 60-65%. RA highly echogenic echodensity 20x3516mm  Consider TEE to further eval RA echodensity.   TEE--- 05/05/18 -- TEE failed to demonstrate any intracardiac clot/or cardiac source of emboli, FINDINGS:  No intracardiac vegetation, mass, thrombus or signficant aortic arch plaque. No right to left shunt seen. Normal left ventricular function. Massive lipomatous hypertrophy is noted incidentally.      Brief Summary 72 y.o. right-handed  female with medical history significant of HTN, HLD, CAD, recurrent CVA with residual left-sided weakness, dysphagia on pured diet, dementia, remote history of CAD, PVD with bilateral iliac stents in 2017--patient was admitted from [facility] on 05/01/2018 with concerns about new onset right-sided weakness, MRI brain however does not explain the new right-sided weakness which actually appears has improved significantly since admission (within 12 to 16 hours) raising concerns about possible TIA.  For TEE on 05/05/18   Plan:-  1)Stroke:  LEFT Ventral pontine and R corona radiata infarcts  secondary to small vessel disease source--- MRI brain confirmed Lt ventral pontine infarct. R periatrial WM infarct, MRA Head shows   L VA occlusion... D/w Neurology/Stroke Team, they recommend increasing aspirin to 325 mg and contiinuing Plavix 71 mg, LDL is 102 goal is less than 70, A1c is 5.5.  Echocardiogram suggests possible right atria echo genic mass, TEE recommended . ,  Noted artery Dopplers without hemodynamically significant stenosis.  Plan is for discharge with aspirin 81 mg and Plavix 75 mg for 3 weeks then Plavix alone after that from a stroke standpoint however patient may continue aspirin and Plavix due to PAD with previous stents, TEE on 05/05/18 w/o cardioembolic source of stroke is identified  2)HTN-allow permissive hypertension given acute stroke, keep systolic blood pressure under 811220 and diastolic blood pressure under 914120 for the next 5 days also  3)PAD-status post prior  bilateral iliac stenting 2017, continue aspirin, Plavix and Lipitor as above  4)Neuropsych-patient with dementia with behavioral disturbance and psychotic features, suspect some component of vascular dementia  5)FEN/dysphagia-swallow eval noted, doing okay on modified pured diet,  6)Social/Ethics-discussed with patient brother Fayrene Fearing at bedside, also discussed with patient's sister, Ms Willodean Rosenthal  7)Hypokalemia-replace  and recheck   Code Status : DNR   Disposition Plan  : SNF  Consults  :  Neuro/Stroke team   DVT Prophylaxis  :  Lovenox    Lab Results  Component Value Date   PLT 183 05/05/2018    Inpatient Medications  Scheduled Meds: . amLODipine  10 mg Oral Daily  . aspirin  300 mg Rectal Daily   Or  . aspirin  325 mg Oral Daily  . atorvastatin  20 mg Oral q1800  . clopidogrel  75 mg Oral Daily  . donepezil  10 mg Oral QHS  . enoxaparin (LOVENOX) injection  40 mg Subcutaneous Q24H  . mirtazapine  15 mg Oral QHS  . potassium chloride  40 mEq Oral Once  . sodium chloride flush  3 mL Intravenous Q12H   Continuous Infusions: . cefTRIAXone (ROCEPHIN)  IV Stopped (05/04/18 2205)  . dextrose 75 mL/hr at 05/05/18 0306   PRN Meds:.acetaminophen **OR** acetaminophen, hydrALAZINE, ipratropium-albuterol    Anti-infectives (From admission, onward)   Start     Dose/Rate Route Frequency Ordered Stop   05/02/18 2000  cefTRIAXone (ROCEPHIN) 1 g in sodium chloride 0.9 % 100 mL IVPB     1 g 200 mL/hr over 30 Minutes Intravenous Every 24 hours 05/01/18 1945     05/01/18 1915  cefTRIAXone (ROCEPHIN) 1 g in sodium chloride 0.9 % 100 mL IVPB     1 g 200 mL/hr over 30 Minutes Intravenous  Once 05/01/18 1909 05/01/18 2018        Objective:   Vitals:   05/05/18 1410 05/05/18 1420 05/05/18 1427 05/05/18 1504  BP: 113/63 (!) 117/93  119/84  Pulse: (!) 53 60 (!) 51 (!) 56  Resp: 10 13 11 18   Temp:    97.8 F (36.6 C)  TempSrc:    Oral  SpO2: 94% 92% 94% 100%  Weight:        Wt Readings from Last 3 Encounters:  05/04/18 66.4 kg (146 lb 6.2 oz)  03/28/18 69.2 kg (152 lb 9.6 oz)  03/01/18 69.9 kg (154 lb)     Intake/Output Summary (Last 24 hours) at 05/05/2018 1559 Last data filed at 05/05/2018 0900 Gross per 24 hour  Intake 1010 ml  Output 450 ml  Net 560 ml     Physical Exam  Gen:-Lethargic after TEE test  HEENT:- [place].AT, No sclera icterus Neck-Supple Neck,No JVD,.  Lungs-   CTAB , diminished in bases CV- S1, S2 normal Abd-  +ve B.Sounds, Abd Soft, No tenderness,    Extremity:- No  edema,   good pulses Psych-more sleepy after TEE test neuro- Arms Held up equally with no arm drift. 4/5 strength in the left arm, 3/5 left leg with decreased withdrawal to pain. right arm 4/5 strength, 4/5 R leg with brisk withdrawal.but limited effort and cooperation  wiggles toes bilaterally. Skin- Left Heel Necrotic unstageable Decubitus Ulcer/Pressure injury--- POA    Data Review:   Micro Results Recent Results (from the past 240 hour(s))  Urine Culture     Status: Abnormal   Collection Time: 05/01/18  6:06 PM  Result Value Ref Range Status   Specimen Description URINE, RANDOM  Final   Special Requests NONE  Final   Culture (A)  Final    >=100,000 COLONIES/mL ESCHERICHIA COLI >=100,000 COLONIES/mL AEROCOCCUS URINAE Standardized susceptibility testing for this organism is not available. Performed at [facility], [address] [place], [place] [address]    Report Status 05/05/2018 FINAL  Final   Organism ID, Bacteria ESCHERICHIA COLI (A)  Final      Susceptibility   Escherichia coli - MIC*    AMPICILLIN <=2 SENSITIVE Sensitive     CEFAZOLIN <=4 SENSITIVE Sensitive     CEFTRIAXONE <=1 SENSITIVE Sensitive     CIPROFLOXACIN <=0.25 SENSITIVE Sensitive     GENTAMICIN <=1 SENSITIVE Sensitive     IMIPENEM <=0.25 SENSITIVE Sensitive     NITROFURANTOIN <=16 SENSITIVE Sensitive     TRIMETH/SULFA <=20 SENSITIVE Sensitive     AMPICILLIN/SULBACTAM <=2 SENSITIVE Sensitive     PIP/TAZO <=4 SENSITIVE Sensitive     Extended ESBL NEGATIVE Sensitive     * >=100,000 COLONIES/mL ESCHERICHIA COLI  MRSA PCR Screening     Status: None   Collection Time: 05/01/18 11:48 PM  Result Value Ref Range Status   MRSA by PCR NEGATIVE NEGATIVE Final    Comment:        The GeneXpert MRSA Assay (FDA approved for NASAL specimens only), is one component of a comprehensive MRSA  colonization surveillance program. It is not intended to diagnose MRSA infection nor to guide or monitor treatment for MRSA infections. Performed at [facility], [address] [place], [place] [address]     Radiology Reports Ct Head Wo Contrast  Result Date: 05/01/2018 CLINICAL DATA:  Altered level of consciousness, weakness, last seen normal Thursday, today unable to use RIGHT hand, history of stroke with LEFT side deficit, dementia, hypertension EXAM: CT HEAD WITHOUT CONTRAST TECHNIQUE: Contiguous axial images were obtained from the base of the skull through the vertex without intravenous contrast. Sagittal and coronal MPR images reconstructed from axial data set. COMPARISON:  01/21/2018 FINDINGS: Brain: Generalized atrophy. Normal ventricular morphology. No midline shift or mass effect. Small vessel chronic ischemic changes of deep cerebral white matter. Multiple old infarcts involving the basal ganglia and thalami bilaterally, question mildly increased in thalami since prior exam particularly on RIGHT. No intracranial hemorrhage, mass lesion, evidence of acute infarction, or extra-axial fluid collection. Vascular: Atherosclerotic calcifications of the internal carotid arteries at the skull base. Skull: Intact Sinuses/Orbits: Clear Other: N/A IMPRESSION: Atrophy with small vessel chronic ischemic changes of deep cerebral white matter. Lacunar infarcts in BILATERAL basal ganglia and thalami, question mildly progressive in thalami especially RIGHT since prior exam. Electronically Signed   By: Ulyses Southward M.D.   On: 05/01/2018 18:29   Mr Brain Wo Contrast  Result Date: 05/02/2018 CLINICAL DATA:  Initial evaluation for acute right-sided weakness. EXAM: MRI HEAD WITHOUT CONTRAST MRA HEAD WITHOUT CONTRAST TECHNIQUE: Multiplanar, multiecho pulse sequences of the brain and surrounding structures were obtained without intravenous contrast. Angiographic images of the head were obtained using MRA  technique without contrast. COMPARISON:  Prior CT from 05/01/2018 as well as previous MRI from 12/30/2017. FINDINGS: MRI HEAD FINDINGS Brain: Advanced cerebral atrophy for age. Extensive patchy and confluent T2/FLAIR hyperintensity throughout the periventricular and deep white matter both cerebral hemispheres, consistent with chronic microvascular ischemic disease, fairly advanced in nature. Chronic microvascular ischemic changes extend into the brainstem and middle cerebellar peduncles as well. Multiple superimposed remote lacunar infarcts present within the hemispheric cerebral white matter, deep gray nuclei, and  brainstem as well as the right middle cerebellar peduncle. Innumerable chronic micro hemorrhages again seen, primarily clustered around the deep gray nuclei and cerebellum, favored to reflect sequelae of poorly controlled hypertension. 6 mm acute ischemic infarct present at the right ventral pons (series 5, image 53). Probable additional faint subacute 6 mm ischemia infarct within the right periatrial white matter (series 5, image 60). No associated hemorrhage or mass effect. No other evidence for acute or subacute ischemia. No mass lesion, midline shift or mass effect. Diffuse ventricular prominence related to global parenchymal volume loss of hydrocephalus. No extra-axial fluid collection. Pituitary gland within normal limits. Vascular: Abnormal flow void within the left V4 segment, likely occluded. The major intracranial vascular flow voids otherwise maintained. Skull and upper cervical spine: Craniocervical junction within normal limits. Upper cervical spine demonstrates no significant finding. Bone marrow signal intensity within normal limits. No scalp soft tissue abnormality. Sinuses/Orbits: Globes and orbital soft tissues within normal limits. Patient status post lens extraction on the left. Paranasal sinuses are clear. Small volume opacity within the mastoid air cells bilaterally, of doubtful  significance. Inner ear structures normal. Other: None. MRA HEAD FINDINGS ANTERIOR CIRCULATION: Distal examination degraded by motion artifact. Distal cervical segments of the internal carotid arteries are patent with antegrade flow. Petrous segments patent bilaterally. Scattered atheromatous irregularity within the cavernous/supraclinoid ICAs without high-grade stenosis, left worse than right. A1 segments patent bilaterally anterior communicating artery grossly normal. Anterior cerebral arteries irregular but patent to their distal aspects without definite stenosis. M1 segments mildly irregular but patent without high-grade stenosis. Right MCA bifurcates early. Severe proximal left M2 stenoses noted (series 1045, image 8). Advanced small vessel atheromatous irregularity throughout the MCA branches distally. POSTERIOR CIRCULATION: Visualized right vertebral artery patent to the vertebrobasilar junction without stenosis. Left V4 segment not visualized, occluded. Slight retrograde filling into the distal left V4 segment. Right PICA patent proximally. Left PICA not visualized. Basilar irregular but patent to its distal aspect without flow-limiting stenosis. Superior cerebral arteries patent proximally. Probable severe left SCA stenosis noted (series 1037, image 9). Both of the PCAs arise from the basilar artery. Moderate right P1 stenosis (series 1037, image 9). Additional moderate distal right P2 stenosis (series 1037, image 16). Additional atheromatous irregularity throughout the remaining PCAs bilaterally, which do remain patent to their distal aspects. No aneurysm. IMPRESSION: MRI HEAD IMPRESSION: 1. 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 2. 6 mm subacute small vessel white matter infarct involving the right periatrial white matter. 3. Atrophy with severe chronic microvascular ischemic disease. 4. Innumerable chronic micro hemorrhages throughout the brain, favored to be secondary to  poorly controlled hypertension. MRA HEAD IMPRESSION: 1. Occlusion of the left vertebral artery. 2. Advanced atheromatous change throughout the intracranial circulation. Notable findings include severe proximal left M2 stenoses, severe left SCA stenosis, with moderate right P1 and P2 stenoses. Electronically Signed   By: Rise Mu M.D.   On: 05/02/2018 03:18   Dg Chest Port 1 View  Result Date: 05/01/2018 CLINICAL DATA:  RIGHT-side weakness, last seen normal on Thursday EXAM: PORTABLE CHEST 1 VIEW COMPARISON:  Portable exam 1732 hours compared to 01/21/2018 FINDINGS: Normal heart size, mediastinal contours, and pulmonary vascularity. Mild central peribronchial thickening and minimal LEFT basilar atelectasis. Lungs otherwise clear. No pulmonary infiltrate, pleural effusion or pneumothorax. Bones demineralized. IMPRESSION: Mild bronchitic changes with minimal LEFT basilar atelectasis. Electronically Signed   By: Ulyses Southward M.D.   On: 05/01/2018 18:02   Dg Swallowing Func-speech Pathology  Result  Date: 05/02/2018 Objective Swallowing Evaluation: Type of Study: MBS-Modified Barium Swallow Study  Patient Details Name: TASHEMA TILLER MRN: [id number] Date of Birth: [birth year] Today's Date: 05/02/2018 Time: SLP Start Time (ACUTE ONLY): 1459 -SLP Stop Time (ACUTE ONLY): 1517 SLP Time Calculation (min) (ACUTE ONLY): 18 min Past Medical History: Past Medical History: Diagnosis Date . Cerebral infarction Feb 2019  . Dementia  . HLD (hyperlipidemia)  . Hypertension  . Peripheral vascular disease (HCC)  . Stroke [facility])  Past Surgical History: Past Surgical History: Procedure Laterality Date . CHOLECYSTECTOMY   . LEG SURGERY    Per sister patient shattered leg requiring surgery with placement of hardware . STENT PLACEMENT ILIAC ([facility] HX)   HPI: Pt is a 72 y.o. right-handed female with medical history significant for HTN, HLD, CAD, CVA with residual left-sided weakness, dysphagia on pured diet, dementia, remote  history of CAD, PVD with bilateral iliac stents in 2017, who presents from [facility] with complaints of new right-sided weakness, decreased speech output. MRI shows acute R pontine infarct. Prior BSE 01/22/18 recommended Dys 3 diet and thin liquids due to altered mentation with recommendation to advance back to regular textures as mentation returned to baseline.  Subjective: pt alert, cooperative, minimally conversant but following commands Assessment / Plan / Recommendation CHL IP CLINICAL IMPRESSIONS 05/02/2018 Clinical Impression Pt has a moderate oral dysphagia characterized by weak labial seal, lingual manipulation, and posterior propulsion. She has limited ability to masticate soft solids, holding boluses anteriorly with small munching pattern without fully forming a cohesive bolus. Minimal amounts of soft solids were able to reach the pharynx to be swallowed, with the rest of the residue manually removed by SLP despite Max cues for lingual sweep and pureed/liquid washes. She has baseline left-sided weakness as well as new right-sided weakness with residue pocketed bilaterally. Although still prolonged, her oral phase is more effective with purees and thin liquids, and when she swallows she has good airway protection (intermittent flash penetration only with thin liquids) and pharyngeal efficiency. Recommend starting Dys 1 diet and thin liquids with careful monitoring for oral residue. SLP Visit Diagnosis Dysphagia, oral phase (R13.11) Attention and concentration deficit following -- Frontal lobe and executive function deficit following -- Impact on safety and function Mild aspiration risk;Moderate aspiration risk   CHL IP TREATMENT RECOMMENDATION 05/02/2018 Treatment Recommendations Therapy as outlined in treatment plan below   Prognosis 05/02/2018 Prognosis for Safe Diet Advancement (No Data) Barriers to Reach Goals Cognitive deficits Barriers/Prognosis Comment -- CHL IP DIET RECOMMENDATION 05/02/2018 SLP Diet  Recommendations Dysphagia 1 (Puree) solids;Thin liquid Liquid Administration via Straw Medication Administration Crushed with puree Compensations Minimize environmental distractions;Slow rate;Small sips/bites;Lingual sweep for clearance of pocketing;Monitor for anterior loss Postural Changes Seated upright at 90 degrees   CHL IP OTHER RECOMMENDATIONS 05/02/2018 Recommended Consults -- Oral Care Recommendations Oral care BID Other Recommendations Have oral suction available   CHL IP FOLLOW UP RECOMMENDATIONS 05/02/2018 Follow up Recommendations Skilled Nursing facility   [facility] IP FREQUENCY AND DURATION 05/02/2018 Speech Therapy Frequency (ACUTE ONLY) min 2x/week Treatment Duration 2 weeks      CHL IP ORAL PHASE 05/02/2018 Oral Phase Impaired Oral - Pudding Teaspoon -- Oral - Pudding Cup -- Oral - Honey Teaspoon -- Oral - Honey Cup -- Oral - Nectar Teaspoon -- Oral - Nectar Cup -- Oral - Nectar Straw -- Oral - Thin Teaspoon -- Oral - Thin Cup -- Oral - Thin Straw Weak lingual manipulation;Delayed oral transit Oral - Puree Weak lingual manipulation;Delayed oral transit;Right  pocketing in lateral sulci;Left pocketing in lateral sulci;Lingual/palatal residue Oral - Mech Soft Weak lingual manipulation;Delayed oral transit;Right pocketing in lateral sulci;Left pocketing in lateral sulci;Lingual/palatal residue;Impaired mastication;Pocketing in anterior sulcus Oral - Regular -- Oral - Multi-Consistency -- Oral - Pill -- Oral Phase - Comment --  CHL IP PHARYNGEAL PHASE 05/02/2018 Pharyngeal Phase WFL Pharyngeal- Pudding Teaspoon -- Pharyngeal -- Pharyngeal- Pudding Cup -- Pharyngeal -- Pharyngeal- Honey Teaspoon -- Pharyngeal -- Pharyngeal- Honey Cup -- Pharyngeal -- Pharyngeal- Nectar Teaspoon -- Pharyngeal -- Pharyngeal- Nectar Cup -- Pharyngeal -- Pharyngeal- Nectar Straw -- Pharyngeal -- Pharyngeal- Thin Teaspoon -- Pharyngeal -- Pharyngeal- Thin Cup -- Pharyngeal -- Pharyngeal- Thin Straw -- Pharyngeal -- Pharyngeal- Puree --  Pharyngeal -- Pharyngeal- Mechanical Soft -- Pharyngeal -- Pharyngeal- Regular -- Pharyngeal -- Pharyngeal- Multi-consistency -- Pharyngeal -- Pharyngeal- Pill -- Pharyngeal -- Pharyngeal Comment --  CHL IP CERVICAL ESOPHAGEAL PHASE 05/02/2018 Cervical Esophageal Phase WFL Pudding Teaspoon -- Pudding Cup -- Honey Teaspoon -- Honey Cup -- Nectar Teaspoon -- Nectar Cup -- Nectar Straw -- Thin Teaspoon -- Thin Cup -- Thin Straw -- Puree -- Mechanical Soft -- Regular -- Multi-consistency -- Pill -- Cervical Esophageal Comment -- No flowsheet data found. Maxcine Ham 05/02/2018, 4:01 PM  Maxcine Ham, M.A. CCC-SLP [phone]             Mr Shirlee Latch Wo Contrast  Result Date: 05/02/2018 CLINICAL DATA:  Initial evaluation for acute right-sided weakness. EXAM: MRI HEAD WITHOUT CONTRAST MRA HEAD WITHOUT CONTRAST TECHNIQUE: Multiplanar, multiecho pulse sequences of the brain and surrounding structures were obtained without intravenous contrast. Angiographic images of the head were obtained using MRA technique without contrast. COMPARISON:  Prior CT from 05/01/2018 as well as previous MRI from 12/30/2017. FINDINGS: MRI HEAD FINDINGS Brain: Advanced cerebral atrophy for age. Extensive patchy and confluent T2/FLAIR hyperintensity throughout the periventricular and deep white matter both cerebral hemispheres, consistent with chronic microvascular ischemic disease, fairly advanced in nature. Chronic microvascular ischemic changes extend into the brainstem and middle cerebellar peduncles as well. Multiple superimposed remote lacunar infarcts present within the hemispheric cerebral white matter, deep gray nuclei, and brainstem as well as the right middle cerebellar peduncle. Innumerable chronic micro hemorrhages again seen, primarily clustered around the deep gray nuclei and cerebellum, favored to reflect sequelae of poorly controlled hypertension. 6 mm acute ischemic infarct present at the right ventral pons (series 5,  image 53). Probable additional faint subacute 6 mm ischemia infarct within the right periatrial white matter (series 5, image 60). No associated hemorrhage or mass effect. No other evidence for acute or subacute ischemia. No mass lesion, midline shift or mass effect. Diffuse ventricular prominence related to global parenchymal volume loss of hydrocephalus. No extra-axial fluid collection. Pituitary gland within normal limits. Vascular: Abnormal flow void within the left V4 segment, likely occluded. The major intracranial vascular flow voids otherwise maintained. Skull and upper cervical spine: Craniocervical junction within normal limits. Upper cervical spine demonstrates no significant finding. Bone marrow signal intensity within normal limits. No scalp soft tissue abnormality. Sinuses/Orbits: Globes and orbital soft tissues within normal limits. Patient status post lens extraction on the left. Paranasal sinuses are clear. Small volume opacity within the mastoid air cells bilaterally, of doubtful significance. Inner ear structures normal. Other: None. MRA HEAD FINDINGS ANTERIOR CIRCULATION: Distal examination degraded by motion artifact. Distal cervical segments of the internal carotid arteries are patent with antegrade flow. Petrous segments patent bilaterally. Scattered atheromatous irregularity within the cavernous/supraclinoid ICAs without high-grade stenosis, left worse than  right. A1 segments patent bilaterally anterior communicating artery grossly normal. Anterior cerebral arteries irregular but patent to their distal aspects without definite stenosis. M1 segments mildly irregular but patent without high-grade stenosis. Right MCA bifurcates early. Severe proximal left M2 stenoses noted (series 1045, image 8). Advanced small vessel atheromatous irregularity throughout the MCA branches distally. POSTERIOR CIRCULATION: Visualized right vertebral artery patent to the vertebrobasilar junction without stenosis.  Left V4 segment not visualized, occluded. Slight retrograde filling into the distal left V4 segment. Right PICA patent proximally. Left PICA not visualized. Basilar irregular but patent to its distal aspect without flow-limiting stenosis. Superior cerebral arteries patent proximally. Probable severe left SCA stenosis noted (series 1037, image 9). Both of the PCAs arise from the basilar artery. Moderate right P1 stenosis (series 1037, image 9). Additional moderate distal right P2 stenosis (series 1037, image 16). Additional atheromatous irregularity throughout the remaining PCAs bilaterally, which do remain patent to their distal aspects. No aneurysm. IMPRESSION: MRI HEAD IMPRESSION: 1. 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 2. 6 mm subacute small vessel white matter infarct involving the right periatrial white matter. 3. Atrophy with severe chronic microvascular ischemic disease. 4. Innumerable chronic micro hemorrhages throughout the brain, favored to be secondary to poorly controlled hypertension. MRA HEAD IMPRESSION: 1. Occlusion of the left vertebral artery. 2. Advanced atheromatous change throughout the intracranial circulation. Notable findings include severe proximal left M2 stenoses, severe left SCA stenosis, with moderate right P1 and P2 stenoses. Electronically Signed   By: Rise Mu M.D.   On: 05/02/2018 03:18     CBC Recent Labs  Lab 04/29/18 05/01/18 1743 05/01/18 1751 05/02/18 0331 05/05/18 0500  WBC 7.2 7.4  --  10.2 8.3  HGB 14.4 15.9* 16.7* 14.7 13.8  HCT 45 51.6* 49.0* 47.7* 43.5  PLT 231 248  --  229 183  MCV  --  92.8  --  92.8 90.4  MCH  --  28.6  --  28.6 28.7  MCHC  --  30.8  --  30.8 31.7  RDW  --  15.2  --  15.3 15.0  LYMPHSABS  --  2.2  --   --   --   MONOABS  --  0.4  --   --   --   EOSABS  --  0.2  --   --   --   BASOSABS  --  0.1  --   --   --     Chemistries  Recent Labs  Lab 05/01/18 1743 05/01/18 1751 05/02/18 0331  05/04/18 1616 05/05/18 0500  NA 151* 152* 151* 143 143  K 3.9 3.8 3.2* 3.0* 3.7  CL 115* 113* 113* 111 110  CO2 25  --  24 25 22   GLUCOSE 105* 104* 97 128* 112*  BUN 16 18 14  5* <5*  CREATININE 0.78 0.60 0.73 0.63 0.69  CALCIUM 9.4  --  8.9 8.8* 8.7*  AST 23  --   --   --   --   ALT 19  --   --   --   --   ALKPHOS 69  --   --   --   --   BILITOT 1.2  --   --   --   --    ------------------------------------------------------------------------------------------------------------------ No results for input(s): CHOL, HDL, LDLCALC, TRIG, CHOLHDL, LDLDIRECT in the last 72 hours.  Lab Results  Component Value Date   HGBA1C 5.5 05/02/2018   ------------------------------------------------------------------------------------------------------------------ No results for input(s): TSH,  T4TOTAL, T3FREE, THYROIDAB in the last 72 hours.  Invalid input(s): FREET3 ------------------------------------------------------------------------------------------------------------------ No results for input(s): VITAMINB12, FOLATE, FERRITIN, TIBC, IRON, RETICCTPCT in the last 72 hours.  Coagulation profile Recent Labs  Lab 05/01/18 1743  INR 1.13    No results for input(s): DDIMER in the last 72 hours.  Cardiac Enzymes Recent Labs  Lab 05/01/18 2044 05/02/18 0331 05/02/18 0731  TROPONINI <0.03 <0.03 <0.03   ------------------------------------------------------------------------------------------------------------------ No results found for: BNP  Shon Hale M.D on 05/05/2018 at 3:59 PM  Between 7am to 7pm - Pager - [phone]  After 7pm go to [email or web address] - password [email or web address]  [facility] -  Office  [phone]  Voice Recognition Reubin Milan dictation system was used to create this note, attempts have been made to correct errors. Please contact the author with questions and/or clarifications.

## 2018-05-05 NOTE — Progress Notes (Signed)
Occupational Therapy Treatment Patient Details Name: Morgan Sutton MRN: [id number] DOB: May 25, 1946 Today's Date: 05/05/2018    History of present illness Pt is a 72 y/o female admitted for possible CVA, workup pending.  PMH significant for CVA and dementia   OT comments  Pt fatigued today, however agreeable to simple grooming tasks and rolling in bed. OT will continue to follow acutely  Follow Up Recommendations  SNF;Supervision/Assistance - 24 hour    Equipment Recommendations  Other (comment)(TBD at next venue of care)    Recommendations for Other Services      Precautions / Restrictions Precautions Precautions: Fall Restrictions Weight Bearing Restrictions: No       Mobility Bed Mobility Overal bed mobility: Needs Assistance Bed Mobility: Rolling Rolling: Total assist         General bed mobility comments: Requires total assist to initiate and complete rolling R and L for hygiene and clothing mnagement. Attempted ax 2 to sit EOB  Transfers                      Balance                                           ADL either performed or assessed with clinical judgement   ADL Overall ADL's : Needs assistance/impaired     Grooming: Bed level;Moderate assistance;Sitting Grooming Details (indicate cue type and reason): mod A with hand over hand to wash face and brush hair with R UE, cues to complete and for thouroughness Upper Body Bathing: Maximal assistance;Bed level;Sitting Upper Body Bathing Details (indicate cue type and reason): hand over hand assist                                 Vision Patient Visual Report: No change from baseline     Perception     Praxis      Cognition Arousal/Alertness: Awake/alert Behavior During Therapy: Flat affect Overall Cognitive Status: History of cognitive impairments - at baseline                                 General Comments: pt with baseline dementia;  minimally follows one step commands this session givem multimodal cues         Exercises     Shoulder Instructions       General Comments      Pertinent Vitals/ Pain       Pain Assessment: No/denies pain Pain Score: 0-No pain Pain Intervention(s): Monitored during session  Home Living                                          Prior Functioning/Environment              Frequency  Min 2X/week        Progress Toward Goals  OT Goals(current goals can now be found in the care plan section)  Progress towards OT goals: OT to reassess next treatment     Plan Discharge plan remains appropriate    Co-evaluation                 AM-PAC  PT "6 Clicks" Daily Activity     Outcome Measure   Help from another person eating meals?: A Lot Help from another person taking care of personal grooming?: A Lot Help from another person toileting, which includes using toliet, bedpan, or urinal?: Total Help from another person bathing (including washing, rinsing, drying)?: Total Help from another person to put on and taking off regular upper body clothing?: Total Help from another person to put on and taking off regular lower body clothing?: Total 6 Click Score: 8    End of Session    OT Visit Diagnosis: Muscle weakness (generalized) (M62.81);Cognitive communication deficit (R41.841)   Activity Tolerance Patient limited by fatigue   Patient Left in bed;with call bell/phone within reach;with bed alarm set   Nurse Communication      Functional Assessment Tool Used: AM-PAC 6 Clicks Daily Activity   Time: 6962-9528 OT Time Calculation (min): 17 min  Charges: OT G-codes **NOT FOR INPATIENT CLASS** Functional Assessment Tool Used: AM-PAC 6 Clicks Daily Activity OT General Charges $OT Visit: 1 Visit OT Treatments $Therapeutic Activity: 8-22 mins     Galen Manila 05/05/2018, 1:41 PM

## 2018-05-05 NOTE — Progress Notes (Signed)
STROKE TEAM PROGRESS NOTE   INTERVAL HISTORY No one is at the bedside.  Family met with palliative care team and agreed to DO NOT RESUSCITATE but would like to continue medical aggressive care including anticoagulation if necessary hence TEE is scheduled.  Vitals:   05/05/18 1400 05/05/18 1410 05/05/18 1420 05/05/18 1427  BP: 122/77 113/63 (!) 117/93   Pulse: (!) 51 (!) 53 60 (!) 51  Resp: _0 Temp: 97.6 F (36.4 C)     TempSrc: Oral     SpO2: 96% 94% 92% 94%  Weight:        CBC:  Recent Labs  Lab 04/29/18  05/01/18 1743  05/02/18 0331 05/05/18 0500  WBC 7.2   < > 7.4  --  10.2 8.3  NEUTROABS 5  --  4.6  --   --   --   HGB 14.4  --  15.9*   < > 14.7 13.8  HCT 45  --  51.6*   < > 47.7* 43.5  MCV  --    < > 92.8  --  92.8 90.4  PLT 231  --  248  --  229 183   < > = values in this interval not displayed.    Basic Metabolic Panel:  Recent Labs  Lab 05/04/18 1616 05/05/18 0500  NA 143 143  K 3.0* 3.7  CL 111 110  CO2 25 22  GLUCOSE 128* 112*  BUN 5* <5*  CREATININE 0.63 0.69  CALCIUM 8.8* 8.7*   Lipid Panel:     Component Value Date/Time   CHOL 161 05/02/2018 0331   TRIG 115 05/02/2018 0331   HDL 36 (L) 05/02/2018 0331   CHOLHDL 4.5 05/02/2018 0331   VLDL 23 05/02/2018 0331   LDLCALC 102 (H) 05/02/2018 0331   HgbA1c:  Lab Results  Component Value Date   HGBA1C 5.5 05/02/2018   Urine Drug Screen:     Component Value Date/Time   LABOPIA NONE DETECTED 05/01/2018 1748   COCAINSCRNUR NONE DETECTED 05/01/2018 1748   LABBENZ NONE DETECTED 05/01/2018 1748   AMPHETMU NONE DETECTED 05/01/2018 1748   THCU NONE DETECTED 05/01/2018 1748   LABBARB NONE DETECTED 05/01/2018 1748    Alcohol Level     Component Value Date/Time   ETH <10 05/01/2018 1745    IMAGING Ct Head Wo Contrast 05/01/2018 Atrophy with small vessel chronic ischemic changes of deep cerebral white matter. Lacunar infarcts in BILATERAL basal ganglia and thalami, question mildly  progressive in thalami especially RIGHT since prior exam.   Mr Brain Wo Contrast 05/02/2018 1. 6 mm acute ischemic nonhemorrhagic ventral LEFT pontine infarct. 2. 6 mm subacute small vessel white matter infarct involving the RIGHT periatrial white matter. 3. Atrophy with severe chronic microvascular ischemic disease. 4. Innumerable chronic micro hemorrhages throughout the brain, favored to be secondary to poorly controlled hypertension.   Mr Jodene Nam Head Wo Contrast 05/02/2018 1. Occlusion of the left vertebral artery. 2. Advanced atheromatous change throughout the intracranial circulation. Notable findings include severe proximal left M2 stenoses, severe left SCA stenosis, with moderate right P1 and P2 stenoses.   2D Echocardiogram  - Left ventricle: The cavity size was normal. There was moderate concentric hypertrophy. Systolic function was normal. The estimated ejection fraction was in the range of 60% to 65%. Wall motion was normal; there were no regional wall motion abnormalities. Features are consistent with a pseudonormal left ventricular filling pattern, with concomitant abnormal relaxation and increased filling pressure (grade  2 diastolic dysfunction). Doppler parameters are consistent with elevated ventricular end-diastolic filling pressure. - Aortic valve: There was no regurgitation. - Aortic root: The aortic root was normal in size. - Mitral valve: There was mild regurgitation. - Right ventricle: The cavity size was normal. Wall thickness was normal. Systolic function was normal. - Tricuspid valve: There was no regurgitation. - Pulmonic valve: There was no regurgitation. - Pulmonary arteries: Systolic pressure could not be accurately estimated. - Inferior vena cava: The vessel was normal in size. - Pericardium, extracardiac: There was no pericardial effusion. Impressions:   There is highly echogenic echodensity in the right atrium measuring 20 x 16 mm. A TEE or a cardiac MRI is recommended  for further evaluation.  Carotid Doppler   There is 1-39% bilateral ICA stenosis. Vertebral artery flow is antegrade.   PHYSICAL EXAM  i Constitutional: frail elderly Caucasian lady not in distress Psych: Affect appropriate to situation Eyes: No scleral injection HENT: No OP obstrucion Head: Normocephalic.  Cardiovascular: Normal rate and regular rhythm.  Respiratory: Effort normal, non-labored breathing Skin: WDI  Neuro: Mental Status: Patient is awake, alert, attemtps to speak but very dysarthric and cannot discern what she is saying Cranial Nerves: II: Visual Fields are full. Pupils are equal, round, and reactive to light.   III,IV, VI: EOMI without ptosis or diploplia.  V: Facial sensation is symmetric  VII: Facial movement appears to be slightly weak on the right VIII: hearing is intact to voice X: Uvula elevates symmetrically XI: Shoulder shrug is symmetric. XII: tongue is midline without atrophy or fasciculations.  Motor: Arms held up equally with no arm drift. 4/5 strength in the left arm, 4/5 left leg with decreased withdrawal to pain. right arm 4/5 strength, 4/5 R leg with brisk withdrawal.but limited effort and cooperation  wiggles toes bilaterally. Sensory: She endorses symmetric sensation Cerebellar: She does not perform   ASSESSMENT/PLAN Ms. Tondra MONSERATH NEFF is a 72 y.o. female with history of previous stroke in 12/2017, HTN, HLD, PVD and baseline dementia who is bedbound presenting with R hemiparesis.   Stroke:  LEFT ventral pontine and R corona radiata infarcts secondary to small vessel disease vs undefined RA echodensity  CT head No acute stroke. Small vessel disease. Atrophy. B BG and thalamic lacunes, progressive since Feb.   MRI  Left ventral pontine infarct. R periatrial WM infarct. Small vessel disease. Atrophy.  Multiple chronic microhemorrhages throughout the brain (due to uncontrolled hypertension)  MRA  L VA occlusion. Advanced atherosclerosis:  L  M2, L SCA, R P1 and P2  Carotid Doppler  B ICA 1-39% stenosis, VAs antegrade   2D Echo  EF 60-65%. RA highly echogenic echodensity 20x59m  Consider TEE to further eval RA echodensity.   LDL 102  HgbA1c 5.5  Lovenox 40 mg sq daily for VTE prophylaxis  aspirin 81 mg daily and clopidogrel 75 mg daily prior to admission, now on aspirin 325 mg daily and clopidogrel 75 mg daily.  Okay to continue aspirin 81 mg and Plavix 75 mg daily at discharge x 3 weeks then one alone  Therapy recommendations:  SNF   Disposition:  Plan return to SNF (from SNF)  palliative care family conference today  Based on conference findings, consider TEE to further eval RA echodensity, consider tx w/ anticoagulation. Discussed with Dr. CJoesph Fillers Will discuss with Dr. SLeonie Man Dysphagia secondary to stroke  Failed bedside swallow  Passed MBS   On D1 thin liquids  Hypertension  Stable . Permissive hypertension (OK  if < 220/120) but gradually normalize in 5-7 days . Long-term BP goal normotensive  Hyperlipidemia  Home meds: Lipitor 20, resumed in hospital  LDL 102, goal < 70  Continue statin at discharge  Other Stroke Risk Factors  Advanced age  Hx stroke/TIA  12/2017 - residual L HP and dysphasia  Coronary artery disease  PVD - B iliac stent 2017  Other Active Problems  Baseline dementia w/ psychotic features  Hypokalemia 3.2  Hx Bell's Palsy - R face  Hx involuntary commitment - psychosis (behavior change, homicidal thoughts, delusions, paranoia)  Hospital day # 4   The patient has   significant dementia at baseline but not much physical deficits from her small strokes. Transthoracic echo has shown a right atrial echodensity and family wants to pursue aggressive workup and anticoagulation if deemed necessary.await transesophageal echocardiogram to evaluate the right atrial echodensity further. Family not available at the bedside. Discussed with Dr. Julaine Fusi,  MD Medical Director [place] Pager: [phone] 05/05/2018 2:34 PM  To contact Stroke Continuity provider, please refer to [email or web address] After hours, contact General Neurology

## 2018-05-05 NOTE — Op Note (Signed)
INDICATIONS: stroke  PROCEDURE:   Informed consent was obtained prior to the procedure. The risks, benefits and alternatives for the procedure were discussed and the patient comprehended these risks.  Risks include, but are not limited to, cough, sore throat, vomiting, nausea, somnolence, esophageal and stomach trauma or perforation, bleeding, low blood pressure, aspiration, pneumonia, infection, trauma to the teeth and death.    After a procedural time-out, the oropharynx was anesthetized with 20% benzocaine spray.   During this procedure the patient was administered a total of Versed 4 mg and Fentanyl 50 mcvg to achieve and maintain moderate conscious sedation.  The patient's heart rate, blood pressure, and oxygen saturationweare monitored continuously during the procedure. The period of conscious sedation was 11 minutes, of which I was present face-to-face 100% of this time.  The transesophageal probe was inserted in the esophagus and stomach without difficulty and multiple views were obtained.  The patient was kept under observation until the patient left the procedure room.  The patient left the procedure room in stable condition.   Agitated microbubble saline contrast was administered.  COMPLICATIONS:    There were no immediate complications.  FINDINGS:  No intracardiac vegetation, mass, thrombus or signficant aortic arch plaque. No right to left shunt seen. Normal left ventricular function. Massive lipomatous hypertrophy is noted incidentally.   RECOMMENDATIONS:    No cardioembolic source of stroke is identified. Consider arrhythmia monitor for occult atrial fibrillation.  Time Spent Directly with the Patient:  45 minutes   Mirna Sutcliffe 05/05/2018, 1:54 PM

## 2018-05-06 MED ORDER — ASPIRIN EC 81 MG PO TBEC: 81.0000 mg | DELAYED_RELEASE_TABLET | Freq: Every day | ORAL | 3 refills | Status: AC

## 2018-05-06 MED ORDER — ATORVASTATIN CALCIUM 40 MG PO TABS: 40.0000 mg | ORAL_TABLET | Freq: Every evening | ORAL | 3 refills | Status: DC

## 2018-05-06 NOTE — Discharge Instructions (Signed)
1) Left Heel Necrotic unstageable Decubitus Ulcer/Pressure injury--- POA ,Lower Back/Lumbar Area Stage III Decubitus- Please get wound care consult for dressing changes at nursing home 2)Palliative care consult at the nursing home to help guide goals of care 3)Frequent (q 2 to 4 hr) turns to prevent decubitus ulcers 4)Repeat CBC and CMP test in 1 week

## 2018-05-06 NOTE — Progress Notes (Signed)
STROKE TEAM PROGRESS NOTE   INTERVAL HISTORY No one is at the bedside. TEE yesterday shows no evidence of right atrial echodensity or any cardiac source of embolism. Patient is being transferred to skilled nursing facility later today.  Vitals:   05/05/18 2232 05/06/18 0443 05/06/18 0822 05/06/18 1312  BP: (!) 155/83 (!) 169/76 (!) 160/90 (!) 152/84  Pulse: 68 (!) 54 (!) 59 60  Resp: 18 18 18 16   Temp: 98.1 F (36.7 C) 97.8 F (36.6 C) 97.8 F (36.6 C) 97.9 F (36.6 C)  TempSrc: Oral  Oral Oral  SpO2: 93% 96% 94% 97%  Weight:        CBC:  Recent Labs  Lab 05/01/18 1743  05/02/18 0331 05/05/18 0500  WBC 7.4  --  10.2 8.3  NEUTROABS 4.6  --   --   --   HGB 15.9*   < > 14.7 13.8  HCT 51.6*   < > 47.7* 43.5  MCV 92.8  --  92.8 90.4  PLT 248  --  229 183   < > = values in this interval not displayed.    Basic Metabolic Panel:  Recent Labs  Lab 05/04/18 1616 05/05/18 0500  NA 143 143  K 3.0* 3.7  CL 111 110  CO2 25 22  GLUCOSE 128* 112*  BUN 5* <5*  CREATININE 0.63 0.69  CALCIUM 8.8* 8.7*   Lipid Panel:     Component Value Date/Time   CHOL 161 05/02/2018 0331   TRIG 115 05/02/2018 0331   HDL 36 (L) 05/02/2018 0331   CHOLHDL 4.5 05/02/2018 0331   VLDL 23 05/02/2018 0331   LDLCALC 102 (H) 05/02/2018 0331   HgbA1c:  Lab Results  Component Value Date   HGBA1C 5.5 05/02/2018   Urine Drug Screen:     Component Value Date/Time   LABOPIA NONE DETECTED 05/01/2018 1748   COCAINSCRNUR NONE DETECTED 05/01/2018 1748   LABBENZ NONE DETECTED 05/01/2018 1748   AMPHETMU NONE DETECTED 05/01/2018 1748   THCU NONE DETECTED 05/01/2018 1748   LABBARB NONE DETECTED 05/01/2018 1748    Alcohol Level     Component Value Date/Time   ETH <10 05/01/2018 1745    IMAGING Ct Head Wo Contrast 05/01/2018 Atrophy with small vessel chronic ischemic changes of deep cerebral white matter. Lacunar infarcts in BILATERAL basal ganglia and thalami, question mildly progressive in  thalami especially RIGHT since prior exam.   Mr Brain Wo Contrast 05/02/2018 1. 6 mm acute ischemic nonhemorrhagic ventral LEFT pontine infarct. 2. 6 mm subacute small vessel white matter infarct involving the RIGHT periatrial white matter. 3. Atrophy with severe chronic microvascular ischemic disease. 4. Innumerable chronic micro hemorrhages throughout the brain, favored to be secondary to poorly controlled hypertension.   Mr [name] Wo Contrast 05/02/2018 1. Occlusion of the left vertebral artery. 2. Advanced atheromatous change throughout the intracranial circulation. Notable findings include severe proximal left M2 stenoses, severe left SCA stenosis, with moderate right P1 and P2 stenoses.   2D Echocardiogram  - Left ventricle: The cavity size was normal. There was moderate concentric hypertrophy. Systolic function was normal. The estimated ejection fraction was in the range of 60% to 65%. Wall motion was normal; there were no regional wall motion abnormalities. Features are consistent with a pseudonormal left ventricular filling pattern, with concomitant abnormal relaxation and increased filling pressure (grade 2 diastolic dysfunction). Doppler parameters are consistent with elevated ventricular end-diastolic filling pressure. - Aortic valve: There was no regurgitation. - Aortic root:  The aortic root was normal in size. - Mitral valve: There was mild regurgitation. - Right ventricle: The cavity size was normal. Wall thickness was normal. Systolic function was normal. - Tricuspid valve: There was no regurgitation. - Pulmonic valve: There was no regurgitation. - Pulmonary arteries: Systolic pressure could not be accurately estimated. - Inferior vena cava: The vessel was normal in size. - Pericardium, extracardiac: There was no pericardial effusion. Impressions:   There is highly echogenic echodensity in the right atrium measuring 20 x 16 mm. A TEE or a cardiac MRI is recommended for further  evaluation.  Carotid Doppler   There is 1-39% bilateral ICA stenosis. Vertebral artery flow is antegrade.   PHYSICAL EXAM  i Constitutional: frail elderly Caucasian lady not in distress Psych: Affect appropriate to situation Eyes: No scleral injection HENT: No OP obstrucion Head: Normocephalic.  Cardiovascular: Normal rate and regular rhythm.  Respiratory: Effort normal, non-labored breathing Skin: WDI  Neuro: Mental Status: Patient is awake, alert, attemtps to speak but very dysarthric and cannot discern what she is saying Cranial Nerves: II: Visual Fields are full. Pupils are equal, round, and reactive to light.   III,IV, VI: EOMI without ptosis or diploplia.  V: Facial sensation is symmetric  VII: Facial movement appears to be slightly weak on the right VIII: hearing is intact to voice X: Uvula elevates symmetrically XI: Shoulder shrug is symmetric. XII: tongue is midline without atrophy or fasciculations.  Motor: Arms held up equally with no arm drift. 4/5 strength in the left arm, 4/5 left leg with decreased withdrawal to pain. right arm 4/5 strength, 4/5 R leg with brisk withdrawal.but limited effort and cooperation  wiggles toes bilaterally. Sensory: She endorses symmetric sensation Cerebellar: She does not perform   ASSESSMENT/PLAN [name] is a 72 y.o. female with history of previous stroke in 12/2017, HTN, HLD, PVD and baseline dementia who is bedbound presenting with R hemiparesis.   Stroke:  LEFT ventral pontine and R corona radiata infarcts secondary to small vessel disease vs undefined RA echodensity  CT head No acute stroke. Small vessel disease. Atrophy. B BG and thalamic lacunes, progressive since Feb.   MRI  Left ventral pontine infarct. R periatrial WM infarct. Small vessel disease. Atrophy.  Multiple chronic microhemorrhages throughout the brain (due to uncontrolled hypertension)  MRA  L VA occlusion. Advanced atherosclerosis:  L M2, L SCA, R  P1 and P2  Carotid Doppler  B ICA 1-39% stenosis, VAs antegrade   2D Echo  EF 60-65%. RA highly echogenic echodensity 20x96mm  Consider TEE to further eval RA echodensity.   LDL 102  HgbA1c 5.5  Lovenox 40 mg sq daily for VTE prophylaxis  aspirin 81 mg daily and clopidogrel 75 mg daily prior to admission, now on aspirin 325 mg daily and clopidogrel 75 mg daily.  Okay to continue aspirin 81 mg and Plavix 75 mg daily at discharge x 3 weeks then one alone  Therapy recommendations:  SNF   Disposition:  Plan return to SNF (from SNF)  palliative care family conference today  Based on conference findings, consider TEE to further eval RA echodensity, consider tx w/ anticoagulation. Discussed with Dr. Marisa Severin. Will discuss with Dr. Pearlean Brownie  Dysphagia secondary to stroke  Failed bedside swallow  Passed MBS   On D1 thin liquids  Hypertension  Stable . Permissive hypertension (OK if < 220/120) but gradually normalize in 5-7 days . Long-term BP goal normotensive  Hyperlipidemia  Home meds: Lipitor 20, resumed in  hospital  LDL 102, goal < 70  Continue statin at discharge  Other Stroke Risk Factors  Advanced age  Hx stroke/TIA  12/2017 - residual L HP and dysphasia  Coronary artery disease  PVD - B iliac stent 2017  Other Active Problems  Baseline dementia w/ psychotic features  Hypokalemia 3.2  Hx Bell's Palsy - R face  Hx involuntary commitment - psychosis (behavior change, homicidal thoughts, delusions, paranoia)  Hospital day # 5  Agree with transfer to nursing home. Continue aspirin and Plavix to prior history of stents. Stroke team will sign off. Kindly call for questions. Delia Heady, MD Medical Director [facility] Pager: [phone] 05/06/2018 1:36 PM  To contact Stroke Continuity provider, please refer to [email or web address]. After hours, contact General Neurology

## 2018-05-06 NOTE — Progress Notes (Signed)
  Speech Language Pathology Treatment: Dysphagia  Patient Details Name: Morgan Sutton MRN: [id number] DOB: 11-13-1946 Today's Date: 05/06/2018 Time: [id number] SLP Time Calculation (min) (ACUTE ONLY): 20 min  Assessment / Plan / Recommendation Clinical Impression  ST follow up for therapeutic diet tolerance.  Patient noted to be on a regular textured diet when she was recommended for a dysphagia 1 diet with thin liquids.  Breakfast tray was in the room and patient was given material off that tray that was appropriate to diet recommendation from the MBS that was completed on Monday.  Chart review indicated that the patient has been afebrile and her lungs have been clear but diminished.  Nursing reporting no issues with intake although the chart indicated that her intake has been poor at times.  Meal observation was completed and the patient was slow orally to move material with some anterior escape noted.  At times, she was noted to pocket material particularly in the right cheek which required manual removal by therapist despite max visual and verbal cues for lingual sweep to remove this material.  No overt s/s of aspiration were seen given pureed material and thin liquids via small straw sips.  Patient appears to be tolerating diet.  ST will continue to follow.   HPI HPI: Pt is a 72 y.o. right-handed female with medical history significant for HTN, HLD, CAD, CVA with residual left-sided weakness, dysphagia on pured diet, dementia, remote history of CAD, PVD with bilateral iliac stents in 2017, [facility] complaints of new right-sided weakness, decreased speech output. MRI shows acute R pontine infarct. Prior BSE 01/22/18 recommended Dys 3 diet and thin liquids due to altered mentation with recommendation to advance back to regular textures as mentation returned to baseline.       SLP Plan  Continue with current plan of care       Recommendations  Diet recommendations:  Dysphagia 1 (puree);Thin liquid Liquids provided via: Straw;Cup Medication Administration: Crushed with puree Supervision: Trained caregiver to feed patient Compensations: Minimize environmental distractions;Slow rate;Small sips/bites;Lingual sweep for clearance of pocketing;Monitor for anterior loss Postural Changes and/or Swallow Maneuvers: Seated upright 90 degrees                Oral Care Recommendations: Oral care QID Follow up Recommendations: Skilled Nursing facility SLP Visit Diagnosis: Dysphagia, oral phase (R13.11) Plan: Continue with current plan of care       GO               Morgan AguasMelissa Geraldine Tesar, MA, CCC-SLP Acute Rehab SLP [phone]  Fleet ContrasMelissa N Sheril Sutton 05/06/2018, 9:46 AM

## 2018-05-06 NOTE — Progress Notes (Signed)
Patient will DC to: [facility] Anticipated DC date: 05/06/18 Family notified: Sister, Sybil Transport by: Dayna BarkerPTAR 2pm   Per MD [facility]. RN, patient, patient's family, and facility notified of DC. Discharge Summary sent to facility. RN given number for report [phone] Room 201B). DC packet on chart. Ambulance transport requested for patient.   CSW signing off.  Cristobal GoldmannNadia Mickie Kozikowski, LCSW Clinical Social Worker [phone]

## 2018-05-06 NOTE — Progress Notes (Signed)
Physical Therapy Treatment Patient Details Name: Morgan Sutton MRN: [id number] DOB: [birth year] Today's Date: 05/06/2018    History of Present Illness Pt is a 72 y/o female admitted for possible CVA, workup pending.  PMH significant for CVA and dementia    PT Comments    Pt was seen for evaluation of mobility and function today, worked on bedside sitting control and balance with cues for maintenance of control of trunk. Pt is motivated in a limited way once PT worked on repetitive instruction and assistance, sat with greater control finally.  Will work on her mobility with these repetitions again, and positioned her on the bed to increase comfort and improve postural alignment.   Follow Up Recommendations  SNF;Supervision/Assistance - 24 hour     Equipment Recommendations  None recommended by PT    Recommendations for Other Services       Precautions / Restrictions Precautions Precautions: Fall Precaution Comments: resists upright sitting Restrictions Weight Bearing Restrictions: No    Mobility  Bed Mobility Overal bed mobility: Needs Assistance Bed Mobility: Rolling;Supine to Sit;Sit to Supine Rolling: Total assist   Supine to sit: Total assist Sit to supine: Total assist   General bed mobility comments: pt can assist sitting control once set but does not transition without help  Transfers Overall transfer level: Needs assistance               General transfer comment: pt was total help to move and did not offer to move her legs to wb  Ambulation/Gait             General Gait Details: unable   Stairs             Wheelchair Mobility    Modified Rankin (Stroke Patients Only)       Balance Overall balance assessment: Needs assistance Sitting-balance support: Feet supported;Bilateral upper extremity supported Sitting balance-Leahy Scale: Poor Sitting balance - Comments: resists then poor+ once set                                     Cognition Arousal/Alertness: Awake/alert Behavior During Therapy: Flat affect Overall Cognitive Status: History of cognitive impairments - at baseline                                 General Comments: max assist to bedside and then dense verbal and tactile cues for side leaning to correct sitting tendency to lean back and to the R      Exercises      General Comments        Pertinent Vitals/Pain Pain Assessment: Faces Faces Pain Scale: No hurt Pain Intervention(s): Monitored during session;Other (comment)(pt's expression did not change with stretching)    Home Living                      Prior Function            PT Goals (current goals can now be found in the care plan section) Acute Rehab PT Goals PT Goal Formulation: Patient unable to participate in goal setting Progress towards PT goals: Progressing toward goals    Frequency    Min 2X/week      PT Plan Current plan remains appropriate    Co-evaluation  AM-PAC PT "6 Clicks" Daily Activity  Outcome Measure  Difficulty turning over in bed (including adjusting bedclothes, sheets and blankets)?: Unable Difficulty moving from lying on back to sitting on the side of the bed? : Unable Difficulty sitting down on and standing up from a chair with arms (e.g., wheelchair, bedside commode, etc,.)?: Unable Help needed moving to and from a bed to chair (including a wheelchair)?: Total Help needed walking in hospital room?: Total Help needed climbing 3-5 steps with a railing? : Total 6 Click Score: 6    End of Session   Activity Tolerance: Patient tolerated treatment well;Other (comment)(dementia) Patient left: in bed;with call bell/phone within reach;with bed alarm set Nurse Communication: Mobility status PT Visit Diagnosis: Muscle weakness (generalized) (M62.81);Difficulty in walking, not elsewhere classified (R26.2)     Time: [id number] PT Time Calculation (min)  (ACUTE ONLY): 21 min  Charges:  $Therapeutic Activity: 8-22 mins                    G Codes:  Functional Assessment Tool Used: AM-PAC 6 Clicks Basic Mobility     Ivar DrapeRuth E Sloane Junkin 05/06/2018, 2:25 PM   Samul Dadauth Rondo Spittler, PT MS Acute Rehab Dept. Number: [facility] [phone] and [facility] [phone]

## 2018-05-06 NOTE — Discharge Summary (Signed)
Morgan Sutton, is a 72 y.o. female  DOB [birth year]  MRN [id number].  Admission date:  05/01/2018  Admitting Physician  Clydie Braun, MD  Discharge Date:  05/06/2018   Primary MD  Eunice Blase, PA-C  Recommendations for primary care physician for things to follow:   1) Left Heel Necrotic unstageable Decubitus Ulcer/Pressure injury--- POA ,Lower Back/Lumbar Area Stage III Decubitus- Please get wound care consult for dressing changes at nursing home 2)Palliative care consult at the nursing home to help guide goals of care 3)Frequent (q 2 to 4 hr) turns to prevent decubitus ulcers 4)Repeat CBC and CMP test in 1 week   Admission Diagnosis  Hypernatremia [E87.0] Acute cystitis without hematuria [N30.00]   Discharge Diagnosis  Hypernatremia [E87.0] Acute cystitis without hematuria [N30.00]    Principal Problem:   Right sided weakness Active Problems:   Acute hypernatremia   History of CVA (cerebrovascular accident) Feb 2019   Hypertension   Dementia   PAD (peripheral artery disease) (HCC)   Dehydration   Palliative care by specialist   DNR (do not resuscitate)   Dysphagia      Past Medical History:  Diagnosis Date  . Cerebral infarction Feb 2019   . Dementia   . HLD (hyperlipidemia)   . Hypertension   . Peripheral vascular disease (HCC)   . Stroke [facility])     Past Surgical History:  Procedure Laterality Date  . CHOLECYSTECTOMY    . LEG SURGERY     Per sister patient shattered leg requiring surgery with placement of hardware  . STENT PLACEMENT ILIAC ([facility] HX)         HPI  from the history and physical done on the day of admission:    HPI: Morgan Sutton is a 72 y.o. right-handed female with medical history significant of HTN, HLD, CAD, CVA with residual left-sided weakness, dysphagia on pured diet, dementia, remote history of CAD, PVD with bilateral iliac stents in 2017; who presents  from [facility] with complaints of weakness new right-sided.  Patient sister provides history over the phone as the patient has known dementia.  At baseline the patient is nonambulatory, able to communicate , and recognizes her sister.  Patient sister apparently comes to visit her regularly. When she went to see her 3 days ago, she had not felt well.  Today, when she went to see her she reported that she did not look well, and was unable to use her right hand to drink out of her sippy cup.  She reported that her right arm was limp and that she had more strength out of her left hand which was previously affected with previous stroke.  When she asked if she was in pain she said in her chest, but reported that she could not really understand the rest of what she was saying.  When she felt her right arm it felt cold to the touch and her neck was also purple in color. She is at the facility and is on a pured  diet and she makes note that they do not keep her tray there long enough for her to stay adequately hydrated or fed herself.  She fears that if she is unable to use her right hand what will do.    She also expresses a significant family history of heart disease on the patient's father's side. The earliest paternal uncle with heart disease was likely in their 62s.  Much of what patient tries to stay is unable to be understood but some words such as  "yes" or "no" are clearly formed.  At this time she does not complain of chest pain.  ED Course: Upon admission into the urgency department patient was noted to be afebrile with pulse 51-66, respirations 11-17, blood pressure 155/84-190/74, and O2 saturations maintained on room air.  Initial CT scan of brain showed atrophy with chronic changes and bilateral basal ganglia and thalami infarcts with possible progression of right thalami infarct.  Labs revealed WBC 7.7, hemoglobin 15.9, sodium 151, albumin 3.4, and troponin 0.  Urinalysis was positive for signs of  infection.  Patient was given 1 L of normal saline IV fluids and Rocephin IV.  TRH called to admit.    Hospital Course:     IMPRESSION: MRI HEAD IMPRESSION:  1) 6 mm acute ischemic nonhemorrhagic ventral LEFT pontine infarct. 2) 6 mm subacute small vessel white matter infarct involving the right periatrial white matter. 3) Atrophy with severe chronic microvascular ischemic disease. 4) Innumerable chronic micro hemorrhages throughout the brain, favored to be secondary to poorly controlled hypertension.  MRA HEAD IMPRESSION:  1. Occlusion of the left vertebral artery. 2. Advanced atheromatous change throughout the intracranial circulation. Notable findings include severe proximal left M2 stenoses, severe left SCA stenosis, with moderate right P1 and P2 Stenoses.   2D EchoEF60-65%.RA highly echogenic echodensity 20x66mm  Consider TEE to further eval RA echodensity.  TEE--- 05/05/18 -- TEE failed to demonstrate any intracardiac clot/or cardiac source of emboli, FINDINGS: No intracardiac vegetation, mass, thrombus or signficant aortic arch plaque. No right to left shunt seen. Normal left ventricular function. Massive lipomatous hypertrophy is noted incidentally.      Brief Summary 72 y.o.right-handed femalewith medical history significant of HTN, HLD, CAD, recurrent CVA with residual left-sided weakness, dysphagia on pured diet, dementia, remote history of CAD, PVD with bilateral iliac stents in 2017--patient was admitted from [facility] nursing home on 05/01/2018 with concerns about new onset right-sided weakness, MRI brain however does not explain the new right-sided weakness which actually appears has improved significantly since admission (within 12 to 16 hours) raising concerns about possible TIA.   Plan:-  1)Stroke:LEFT Ventralpontine and R corona radiatainfarctssecondary to small vessel disease source--- MRI brain confirmed Lt ventral pontine  infarct. R periatrial WM infarct, MRA Head shows L VA occlusion... D/w Neurology/Stroke Team,  Discharge back to SNF.  LDL is 102 goal is less than 70, give Lipitor 40 mg daily.  A1c is 5.5.  Echocardiogram suggests possible right atria echo genic mass, TEE from 05/05/18 does NOT confirm echogenic mass or intracardiac thrombus/TEE on 05/05/18 w/o cardioembolic source of stroke is identified ,  Carotid artery Dopplers without hemodynamically significant stenosis.  Plan is for discharge with aspirin 81 mg and Plavix 75 mg for 3 weeks then Plavix alone after that from a stroke standpoint however patient may continue aspirin and Plavix indefinitely due to PAD with previous stents.  Neurologically patient appears to be more awake, responsive, and moving all 4 extremities spontaneously but not necessarily moving  to specific instructions/commands   2)HTN-stable, restart amlodipine as ordered  3)PAD-status post prior bilateral iliac stenting 2017, continue aspirin, Plavix and Lipitor as above  4)Neuropsych-patient with dementia with behavioral disturbance and psychotic features, suspect some component of vascular dementia  5)FEN/dysphagia-swallow eval noted, doing okay on modified pured diet, dysphagia 1 diet with thin liquids recommended  6)Social/Ethics-discussed with patient brother Fayrene FearingJames at bedside, also discussed with patient's sister, Ms Willodean RosenthalSybil Brown, patient is a DNR/DNI palliative services will follow patient at the SNF  facility  7) Left Heel Necrotic unstageable Decubitus Ulcer/Pressure injury--- POA ,Lower Back/Lumbar Area Stage III Decubitus- Please get wound care consult for dressing changes at nursing home  8) E. coli and Aerococcus UTI-   E. coli was pansensitive, treated with 5 days of IV Rocephin  Code Status : DNR  Disposition Plan  : SNF  Consults  :  Neuro/Stroke team  Discharge Condition: stable  Follow UP  Contact information for after-discharge care    Destination      [facility] .   Service:  Skilled Nursing Contact information: [address] [place] [id number] [phone]               Diet and Activity recommendation:  As advised  Discharge Instructions    Discharge Instructions    Call MD for:  difficulty breathing, headache or visual disturbances   Complete by:  As directed    Call MD for:  persistant dizziness or light-headedness   Complete by:  As directed    Call MD for:  persistant nausea and vomiting   Complete by:  As directed    Call MD for:  severe uncontrolled pain   Complete by:  As directed    Call MD for:  temperature >100.4   Complete by:  As directed    Diet - low sodium heart healthy   Complete by:  As directed    Discharge instructions   Complete by:  As directed    1) Left Heel Necrotic unstageable Decubitus Ulcer/Pressure injury--- POA ,Lower Back/Lumbar Area Stage III Decubitus- Please get wound care consult for dressing changes at nursing home 2)Palliative care consult at the nursing home to help guide goals of care 3)Frequent (q 2 to 4 hr) turns to prevent decubitus ulcers 4)Repeat CBC and CMP test in 1 week   Increase activity slowly   Complete by:  As directed         Discharge Medications     Allergies as of 05/06/2018      Reactions   Seroquel [quetiapine Fumarate] Other (See Comments)   Pt coded, per her sister      Medication List    TAKE these medications   acetaminophen 325 MG tablet Commonly known as:  TYLENOL Take 650 mg by mouth every 6 (six) hours as needed for mild pain, fever or headache.   amLODipine 10 MG tablet Commonly known as:  NORVASC Take 10 mg by mouth daily.   aspirin EC 81 MG tablet Take 1 tablet (81 mg total) by mouth daily. With Food   atorvastatin 40 MG tablet Commonly known as:  LIPITOR Take 1 tablet (40 mg total) by mouth every evening. What changed:    medication strength  how much to take  when to take  this   cholecalciferol 1000 units tablet Commonly known as:  VITAMIN D Take 2,000 Units by mouth daily.   clopidogrel 75 MG tablet Commonly known as:  PLAVIX  Take 75 mg by mouth daily.   cyanocobalamin 1000 MCG/ML injection Commonly known as:  (VITAMIN B-12) Inject 1,000 mcg into the muscle every 30 (thirty) days. On or about the 12th of each month   donepezil 10 MG tablet Commonly known as:  ARICEPT Take 10 mg by mouth at bedtime.   feeding supplement (PRO-STAT SUGAR FREE 64) Liqd Take 30 mLs by mouth daily.   hydrALAZINE 50 MG tablet Commonly known as:  APRESOLINE Take 1 tablet (50 mg total) by mouth every 8 (eight) hours.   mirtazapine 15 MG tablet Commonly known as:  REMERON Take 15 mg by mouth at bedtime.   multivitamin with minerals tablet Take 1 tablet by mouth daily.   NUTRITIONAL SUPPLEMENT PO Take 1 each by mouth 2 (two) times daily. Magic Cup   sertraline 50 MG tablet Commonly known as:  ZOLOFT Take 50 mg by mouth daily.       Major procedures and Radiology Reports - PLEASE review detailed and final reports for all details, in brief -   Ct Head Wo Contrast  Result Date: 05/01/2018 CLINICAL DATA:  Altered level of consciousness, weakness, last seen normal Thursday, today unable to use RIGHT hand, history of stroke with LEFT side deficit, dementia, hypertension EXAM: CT HEAD WITHOUT CONTRAST TECHNIQUE: Contiguous axial images were obtained from the base of the skull through the vertex without intravenous contrast. Sagittal and coronal MPR images reconstructed from axial data set. COMPARISON:  01/21/2018 FINDINGS: Brain: Generalized atrophy. Normal ventricular morphology. No midline shift or mass effect. Small vessel chronic ischemic changes of deep cerebral white matter. Multiple old infarcts involving the basal ganglia and thalami bilaterally, question mildly increased in thalami since prior exam particularly on RIGHT. No intracranial hemorrhage, mass lesion,  evidence of acute infarction, or extra-axial fluid collection. Vascular: Atherosclerotic calcifications of the internal carotid arteries at the skull base. Skull: Intact Sinuses/Orbits: Clear Other: N/A IMPRESSION: Atrophy with small vessel chronic ischemic changes of deep cerebral white matter. Lacunar infarcts in BILATERAL basal ganglia and thalami, question mildly progressive in thalami especially RIGHT since prior exam. Electronically Signed   By: Ulyses Southward M.D.   On: 05/01/2018 18:29   Mr Brain Wo Contrast  Result Date: 05/02/2018 CLINICAL DATA:  Initial evaluation for acute right-sided weakness. EXAM: MRI HEAD WITHOUT CONTRAST MRA HEAD WITHOUT CONTRAST TECHNIQUE: Multiplanar, multiecho pulse sequences of the brain and surrounding structures were obtained without intravenous contrast. Angiographic images of the head were obtained using MRA technique without contrast. COMPARISON:  Prior CT from 05/01/2018 as well as previous MRI from 12/30/2017. FINDINGS: MRI HEAD FINDINGS Brain: Advanced cerebral atrophy for age. Extensive patchy and confluent T2/FLAIR hyperintensity throughout the periventricular and deep white matter both cerebral hemispheres, consistent with chronic microvascular ischemic disease, fairly advanced in nature. Chronic microvascular ischemic changes extend into the brainstem and middle cerebellar peduncles as well. Multiple superimposed remote lacunar infarcts present within the hemispheric cerebral white matter, deep gray nuclei, and brainstem as well as the right middle cerebellar peduncle. Innumerable chronic micro hemorrhages again seen, primarily clustered around the deep gray nuclei and cerebellum, favored to reflect sequelae of poorly controlled hypertension. 6 mm acute ischemic infarct present at the right ventral pons (series 5, image 53). Probable additional faint subacute 6 mm ischemia infarct within the right periatrial white matter (series 5, image 60). No associated  hemorrhage or mass effect. No other evidence for acute or subacute ischemia. No mass lesion, midline shift or mass effect. Diffuse ventricular prominence related  to global parenchymal volume loss of hydrocephalus. No extra-axial fluid collection. Pituitary gland within normal limits. Vascular: Abnormal flow void within the left V4 segment, likely occluded. The major intracranial vascular flow voids otherwise maintained. Skull and upper cervical spine: Craniocervical junction within normal limits. Upper cervical spine demonstrates no significant finding. Bone marrow signal intensity within normal limits. No scalp soft tissue abnormality. Sinuses/Orbits: Globes and orbital soft tissues within normal limits. Patient status post lens extraction on the left. Paranasal sinuses are clear. Small volume opacity within the mastoid air cells bilaterally, of doubtful significance. Inner ear structures normal. Other: None. MRA HEAD FINDINGS ANTERIOR CIRCULATION: Distal examination degraded by motion artifact. Distal cervical segments of the internal carotid arteries are patent with antegrade flow. Petrous segments patent bilaterally. Scattered atheromatous irregularity within the cavernous/supraclinoid ICAs without high-grade stenosis, left worse than right. A1 segments patent bilaterally anterior communicating artery grossly normal. Anterior cerebral arteries irregular but patent to their distal aspects without definite stenosis. M1 segments mildly irregular but patent without high-grade stenosis. Right MCA bifurcates early. Severe proximal left M2 stenoses noted (series 1045, image 8). Advanced small vessel atheromatous irregularity throughout the MCA branches distally. POSTERIOR CIRCULATION: Visualized right vertebral artery patent to the vertebrobasilar junction without stenosis. Left V4 segment not visualized, occluded. Slight retrograde filling into the distal left V4 segment. Right PICA patent proximally. Left PICA not  visualized. Basilar irregular but patent to its distal aspect without flow-limiting stenosis. Superior cerebral arteries patent proximally. Probable severe left SCA stenosis noted (series 1037, image 9). Both of the PCAs arise from the basilar artery. Moderate right P1 stenosis (series 1037, image 9). Additional moderate distal right P2 stenosis (series 1037, image 16). Additional atheromatous irregularity throughout the remaining PCAs bilaterally, which do remain patent to their distal aspects. No aneurysm. IMPRESSION: MRI HEAD IMPRESSION: 1. 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 2. 6 mm subacute small vessel white matter infarct involving the right periatrial white matter. 3. Atrophy with severe chronic microvascular ischemic disease. 4. Innumerable chronic micro hemorrhages throughout the brain, favored to be secondary to poorly controlled hypertension. MRA HEAD IMPRESSION: 1. Occlusion of the left vertebral artery. 2. Advanced atheromatous change throughout the intracranial circulation. Notable findings include severe proximal left M2 stenoses, severe left SCA stenosis, with moderate right P1 and P2 stenoses. Electronically Signed   By: Rise Mu M.D.   On: 05/02/2018 03:18   Dg Chest Port 1 View  Result Date: 05/01/2018 CLINICAL DATA:  RIGHT-side weakness, last seen normal on Thursday EXAM: PORTABLE CHEST 1 VIEW COMPARISON:  Portable exam 1732 hours compared to 01/21/2018 FINDINGS: Normal heart size, mediastinal contours, and pulmonary vascularity. Mild central peribronchial thickening and minimal LEFT basilar atelectasis. Lungs otherwise clear. No pulmonary infiltrate, pleural effusion or pneumothorax. Bones demineralized. IMPRESSION: Mild bronchitic changes with minimal LEFT basilar atelectasis. Electronically Signed   By: Ulyses Southward M.D.   On: 05/01/2018 18:02   Dg Swallowing Func-speech Pathology  Result Date: 05/02/2018 Objective Swallowing Evaluation: Type of  Study: MBS-Modified Barium Swallow Study  Patient Details Name: Morgan Sutton MRN: [id number] Date of Birth: 1946-10-18 Today's Date: 05/02/2018 Time: SLP Start Time (ACUTE ONLY): 1459 -SLP Stop Time (ACUTE ONLY): 1517 SLP Time Calculation (min) (ACUTE ONLY): 18 min Past Medical History: Past Medical History: Diagnosis Date . Cerebral infarction Feb 2019  . Dementia  . HLD (hyperlipidemia)  . Hypertension  . Peripheral vascular disease (HCC)  . Stroke [facility])  Past Surgical History: Past Surgical History: Procedure Laterality  Date . CHOLECYSTECTOMY   . LEG SURGERY    Per sister patient shattered leg requiring surgery with placement of hardware . STENT PLACEMENT ILIAC ([facility] HX)   HPI: Pt is a 72 y.o. right-handed female with medical history significant for HTN, HLD, CAD, CVA with residual left-sided weakness, dysphagia on pured diet, dementia, remote history of CAD, PVD with bilateral iliac stents in 2017, who presents from [facility] with complaints of new right-sided weakness, decreased speech output. MRI shows acute R pontine infarct. Prior BSE 01/22/18 recommended Dys 3 diet and thin liquids due to altered mentation with recommendation to advance back to regular textures as mentation returned to baseline.  Subjective: pt alert, cooperative, minimally conversant but following commands Assessment / Plan / Recommendation CHL IP CLINICAL IMPRESSIONS 05/02/2018 Clinical Impression Pt has a moderate oral dysphagia characterized by weak labial seal, lingual manipulation, and posterior propulsion. She has limited ability to masticate soft solids, holding boluses anteriorly with small munching pattern without fully forming a cohesive bolus. Minimal amounts of soft solids were able to reach the pharynx to be swallowed, with the rest of the residue manually removed by SLP despite Max cues for lingual sweep and pureed/liquid washes. She has baseline left-sided weakness as well as new right-sided weakness with residue pocketed  bilaterally. Although still prolonged, her oral phase is more effective with purees and thin liquids, and when she swallows she has good airway protection (intermittent flash penetration only with thin liquids) and pharyngeal efficiency. Recommend starting Dys 1 diet and thin liquids with careful monitoring for oral residue. SLP Visit Diagnosis Dysphagia, oral phase (R13.11) Attention and concentration deficit following -- Frontal lobe and executive function deficit following -- Impact on safety and function Mild aspiration risk;Moderate aspiration risk   CHL IP TREATMENT RECOMMENDATION 05/02/2018 Treatment Recommendations Therapy as outlined in treatment plan below   Prognosis 05/02/2018 Prognosis for Safe Diet Advancement (No Data) Barriers to Reach Goals Cognitive deficits Barriers/Prognosis Comment -- CHL IP DIET RECOMMENDATION 05/02/2018 SLP Diet Recommendations Dysphagia 1 (Puree) solids;Thin liquid Liquid Administration via Straw Medication Administration Crushed with puree Compensations Minimize environmental distractions;Slow rate;Small sips/bites;Lingual sweep for clearance of pocketing;Monitor for anterior loss Postural Changes Seated upright at 90 degrees   CHL IP OTHER RECOMMENDATIONS 05/02/2018 Recommended Consults -- Oral Care Recommendations Oral care BID Other Recommendations Have oral suction available   CHL IP FOLLOW UP RECOMMENDATIONS 05/02/2018 Follow up Recommendations Skilled Nursing facility   [facility] IP FREQUENCY AND DURATION 05/02/2018 Speech Therapy Frequency (ACUTE ONLY) min 2x/week Treatment Duration 2 weeks      CHL IP ORAL PHASE 05/02/2018 Oral Phase Impaired Oral - Pudding Teaspoon -- Oral - Pudding Cup -- Oral - Honey Teaspoon -- Oral - Honey Cup -- Oral - Nectar Teaspoon -- Oral - Nectar Cup -- Oral - Nectar Straw -- Oral - Thin Teaspoon -- Oral - Thin Cup -- Oral - Thin Straw Weak lingual manipulation;Delayed oral transit Oral - Puree Weak lingual manipulation;Delayed oral transit;Right  pocketing in lateral sulci;Left pocketing in lateral sulci;Lingual/palatal residue Oral - Mech Soft Weak lingual manipulation;Delayed oral transit;Right pocketing in lateral sulci;Left pocketing in lateral sulci;Lingual/palatal residue;Impaired mastication;Pocketing in anterior sulcus Oral - Regular -- Oral - Multi-Consistency -- Oral - Pill -- Oral Phase - Comment --  CHL IP PHARYNGEAL PHASE 05/02/2018 Pharyngeal Phase WFL Pharyngeal- Pudding Teaspoon -- Pharyngeal -- Pharyngeal- Pudding Cup -- Pharyngeal -- Pharyngeal- Honey Teaspoon -- Pharyngeal -- Pharyngeal- Honey Cup -- Pharyngeal -- Pharyngeal- Nectar Teaspoon -- Pharyngeal -- Pharyngeal- Nectar Cup --  Pharyngeal -- Pharyngeal- Nectar Straw -- Pharyngeal -- Pharyngeal- Thin Teaspoon -- Pharyngeal -- Pharyngeal- Thin Cup -- Pharyngeal -- Pharyngeal- Thin Straw -- Pharyngeal -- Pharyngeal- Puree -- Pharyngeal -- Pharyngeal- Mechanical Soft -- Pharyngeal -- Pharyngeal- Regular -- Pharyngeal -- Pharyngeal- Multi-consistency -- Pharyngeal -- Pharyngeal- Pill -- Pharyngeal -- Pharyngeal Comment --  CHL IP CERVICAL ESOPHAGEAL PHASE 05/02/2018 Cervical Esophageal Phase WFL Pudding Teaspoon -- Pudding Cup -- Honey Teaspoon -- Honey Cup -- Nectar Teaspoon -- Nectar Cup -- Nectar Straw -- Thin Teaspoon -- Thin Cup -- Thin Straw -- Puree -- Mechanical Soft -- Regular -- Multi-consistency -- Pill -- Cervical Esophageal Comment -- No flowsheet data found. Morgan Sutton 05/02/2018, 4:01 PM  Morgan Sutton, M.A. CCC-SLP [phone]             Mr Shirlee Latch Wo Contrast  Result Date: 05/02/2018 CLINICAL DATA:  Initial evaluation for acute right-sided weakness. EXAM: MRI HEAD WITHOUT CONTRAST MRA HEAD WITHOUT CONTRAST TECHNIQUE: Multiplanar, multiecho pulse sequences of the brain and surrounding structures were obtained without intravenous contrast. Angiographic images of the head were obtained using MRA technique without contrast. COMPARISON:  Prior CT from 05/01/2018  as well as previous MRI from 12/30/2017. FINDINGS: MRI HEAD FINDINGS Brain: Advanced cerebral atrophy for age. Extensive patchy and confluent T2/FLAIR hyperintensity throughout the periventricular and deep white matter both cerebral hemispheres, consistent with chronic microvascular ischemic disease, fairly advanced in nature. Chronic microvascular ischemic changes extend into the brainstem and middle cerebellar peduncles as well. Multiple superimposed remote lacunar infarcts present within the hemispheric cerebral white matter, deep gray nuclei, and brainstem as well as the right middle cerebellar peduncle. Innumerable chronic micro hemorrhages again seen, primarily clustered around the deep gray nuclei and cerebellum, favored to reflect sequelae of poorly controlled hypertension. 6 mm acute ischemic infarct present at the right ventral pons (series 5, image 53). Probable additional faint subacute 6 mm ischemia infarct within the right periatrial white matter (series 5, image 60). No associated hemorrhage or mass effect. No other evidence for acute or subacute ischemia. No mass lesion, midline shift or mass effect. Diffuse ventricular prominence related to global parenchymal volume loss of hydrocephalus. No extra-axial fluid collection. Pituitary gland within normal limits. Vascular: Abnormal flow void within the left V4 segment, likely occluded. The major intracranial vascular flow voids otherwise maintained. Skull and upper cervical spine: Craniocervical junction within normal limits. Upper cervical spine demonstrates no significant finding. Bone marrow signal intensity within normal limits. No scalp soft tissue abnormality. Sinuses/Orbits: Globes and orbital soft tissues within normal limits. Patient status post lens extraction on the left. Paranasal sinuses are clear. Small volume opacity within the mastoid air cells bilaterally, of doubtful significance. Inner ear structures normal. Other: None. MRA HEAD  FINDINGS ANTERIOR CIRCULATION: Distal examination degraded by motion artifact. Distal cervical segments of the internal carotid arteries are patent with antegrade flow. Petrous segments patent bilaterally. Scattered atheromatous irregularity within the cavernous/supraclinoid ICAs without high-grade stenosis, left worse than right. A1 segments patent bilaterally anterior communicating artery grossly normal. Anterior cerebral arteries irregular but patent to their distal aspects without definite stenosis. M1 segments mildly irregular but patent without high-grade stenosis. Right MCA bifurcates early. Severe proximal left M2 stenoses noted (series 1045, image 8). Advanced small vessel atheromatous irregularity throughout the MCA branches distally. POSTERIOR CIRCULATION: Visualized right vertebral artery patent to the vertebrobasilar junction without stenosis. Left V4 segment not visualized, occluded. Slight retrograde filling into the distal left V4 segment. Right PICA patent proximally. Left PICA not  visualized. Basilar irregular but patent to its distal aspect without flow-limiting stenosis. Superior cerebral arteries patent proximally. Probable severe left SCA stenosis noted (series 1037, image 9). Both of the PCAs arise from the basilar artery. Moderate right P1 stenosis (series 1037, image 9). Additional moderate distal right P2 stenosis (series 1037, image 16). Additional atheromatous irregularity throughout the remaining PCAs bilaterally, which do remain patent to their distal aspects. No aneurysm. IMPRESSION: MRI HEAD IMPRESSION: 1. 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 2. 6 mm subacute small vessel white matter infarct involving the right periatrial white matter. 3. Atrophy with severe chronic microvascular ischemic disease. 4. Innumerable chronic micro hemorrhages throughout the brain, favored to be secondary to poorly controlled hypertension. MRA HEAD IMPRESSION: 1. Occlusion of  the left vertebral artery. 2. Advanced atheromatous change throughout the intracranial circulation. Notable findings include severe proximal left M2 stenoses, severe left SCA stenosis, with moderate right P1 and P2 stenoses. Electronically Signed   By: Rise Mu M.D.   On: 05/02/2018 03:18    Micro Results    Recent Results (from the past 240 hour(s))  Urine Culture     Status: Abnormal   Collection Time: 05/01/18  6:06 PM  Result Value Ref Range Status   Specimen Description URINE, RANDOM  Final   Special Requests NONE  Final   Culture (A)  Final    >=100,000 COLONIES/mL ESCHERICHIA COLI >=100,000 COLONIES/mL AEROCOCCUS URINAE Standardized susceptibility testing for this organism is not available. Performed at [facility], [address], [place], [place] [address]    Report Status 05/05/2018 FINAL  Final   Organism ID, Bacteria ESCHERICHIA COLI (A)  Final      Susceptibility   Escherichia coli - MIC*    AMPICILLIN <=2 SENSITIVE Sensitive     CEFAZOLIN <=4 SENSITIVE Sensitive     CEFTRIAXONE <=1 SENSITIVE Sensitive     CIPROFLOXACIN <=0.25 SENSITIVE Sensitive     GENTAMICIN <=1 SENSITIVE Sensitive     IMIPENEM <=0.25 SENSITIVE Sensitive     NITROFURANTOIN <=16 SENSITIVE Sensitive     TRIMETH/SULFA <=20 SENSITIVE Sensitive     AMPICILLIN/SULBACTAM <=2 SENSITIVE Sensitive     PIP/TAZO <=4 SENSITIVE Sensitive     Extended ESBL NEGATIVE Sensitive     * >=100,000 COLONIES/mL ESCHERICHIA COLI  MRSA PCR Screening     Status: None   Collection Time: 05/01/18 11:48 PM  Result Value Ref Range Status   MRSA by PCR NEGATIVE NEGATIVE Final    Comment:        The GeneXpert MRSA Assay (FDA approved for NASAL specimens only), is one component of a comprehensive MRSA colonization surveillance program. It is not intended to diagnose MRSA infection nor to guide or monitor treatment for MRSA infections. Performed at [facility], [address], [place],  [place] [address]        Today   Subjective    Sula Fetterly today has no new concerns, her intake is fair, no fevers, able to say few words          Patient has been seen and examined prior to discharge   Objective   Blood pressure (!) 160/90, pulse (!) 59, temperature 97.8 F (36.6 C), temperature source Oral, resp. rate 18, weight 66.4 kg (146 lb 6.2 oz), SpO2 94 %.   Intake/Output Summary (Last 24 hours) at 05/06/2018 1055 Last data filed at 05/06/2018 0951 Gross per 24 hour  Intake 120 ml  Output 1450 ml  Net -1330 ml    Exam Gen:-Awake, shakes her head, nods, able to tell me her name HEENT:- [place].AT, No sclera icterus Neck-Supple Neck,No JVD,.  Lungs-  CTAB , improved air movement  CV- S1, S2 normal Abd-  +ve B.Sounds, Abd Soft, No tenderness,    Extremity:- No  edema,   good pulses Psych-awake, affect appears appropriate neuro-patient is moving all 4 extremities spontaneously, however when told to specifically move one extremity or the other she does not always follow commands, overall muscle strength on the right appears to be better.  neuro exam is limited due to cognitive deficits which makes it difficult for her to follow commands skin- Left Heel Necrotic unstageable Decubitus Ulcer/Pressure injury--- POA ,Lower Back/Lumbar Area Stage III Decubitus with dressing over it   Data Review   CBC w Diff:  Lab Results  Component Value Date   WBC 8.3 05/05/2018   HGB 13.8 05/05/2018   HCT 43.5 05/05/2018   PLT 183 05/05/2018   LYMPHOPCT 30 05/01/2018   MONOPCT 5 05/01/2018   EOSPCT 2 05/01/2018   BASOPCT 1 05/01/2018    CMP:  Lab Results  Component Value Date   NA 143 05/05/2018   NA 149 (A) 04/29/2018   K 3.7 05/05/2018   CL 110 05/05/2018   CO2 22 05/05/2018   BUN <5 (L) 05/05/2018   BUN 18 04/29/2018   CREATININE 0.69 05/05/2018   GLU 96 04/29/2018   PROT 6.6 05/01/2018   ALBUMIN 3.4 (L) 05/01/2018   BILITOT 1.2 05/01/2018   ALKPHOS 69 05/01/2018   AST  23 05/01/2018   ALT 19 05/01/2018  .   Total Discharge time is about 33 minutes  Shon Hale M.D on 05/06/2018 at 10:55 AM  [facility]   Office  [phone]  Voice Recognition Reubin Milan dictation system was used to create this note, attempts have been made to correct errors. Please contact the author with questions and/or clarifications.

## 2018-05-06 NOTE — Progress Notes (Addendum)
11:40am-Pt's sister would like to be present for patient transfer. [facility] will be scheduled for 2pm.  11:15am-CSW left voicemail for patient's sister regarding discharge today.  Osborne Cascoadia Latricia Cerrito LCSW [phone]

## 2018-05-06 NOTE — Progress Notes (Addendum)
[facility] Await[facility] transport.  1508 - Pt discharged via [facility] transport. AVS discussed with pt's sister Gillis EndsSybil. Sister asked that patient NOT be given Seroquel - says she doesn't want her sister on any antidepressants. Will page Dr Mariea ClontsEmokpae and [facility]. IV and telemetry removed. Pt in stable condition as she left.  Turns out that patient's sister was confused as to similarity between seroQUEL and serTRALINE (Zoloft). Called [facility] to update.

## 2018-05-07 ENCOUNTER — Encounter (HOSPITAL_COMMUNITY): Payer: Self-pay | Admitting: Cardiovascular Disease

## 2018-05-09 ENCOUNTER — Encounter: Payer: Self-pay | Admitting: Adult Health

## 2018-05-09 ENCOUNTER — Non-Acute Institutional Stay (SKILLED_NURSING_FACILITY): Payer: Medicare Other | Admitting: Adult Health

## 2018-05-09 DIAGNOSIS — I639 Cerebral infarction, unspecified: Secondary | ICD-10-CM | POA: Diagnosis not present

## 2018-05-09 DIAGNOSIS — L89611 Pressure ulcer of right heel, stage 1: Secondary | ICD-10-CM | POA: Diagnosis not present

## 2018-05-09 DIAGNOSIS — I10 Essential (primary) hypertension: Secondary | ICD-10-CM | POA: Diagnosis not present

## 2018-05-09 DIAGNOSIS — L89620 Pressure ulcer of left heel, unstageable: Secondary | ICD-10-CM | POA: Diagnosis not present

## 2018-05-09 DIAGNOSIS — I739 Peripheral vascular disease, unspecified: Secondary | ICD-10-CM | POA: Diagnosis not present

## 2018-05-09 DIAGNOSIS — F0150 Vascular dementia without behavioral disturbance: Secondary | ICD-10-CM

## 2018-05-09 DIAGNOSIS — L89621 Pressure ulcer of left heel, stage 1: Secondary | ICD-10-CM | POA: Diagnosis not present

## 2018-05-09 DIAGNOSIS — F32A Depression, unspecified: Secondary | ICD-10-CM

## 2018-05-09 DIAGNOSIS — L89312 Pressure ulcer of right buttock, stage 2: Secondary | ICD-10-CM

## 2018-05-09 DIAGNOSIS — L89322 Pressure ulcer of left buttock, stage 2: Secondary | ICD-10-CM | POA: Diagnosis not present

## 2018-05-09 DIAGNOSIS — F329 Major depressive disorder, single episode, unspecified: Secondary | ICD-10-CM | POA: Diagnosis not present

## 2018-05-09 NOTE — Progress Notes (Addendum)
Location:  [facility] Room Number: 201-B Place of Service:  SNF (31) Provider:  Kenard Gower, NP  Patient Care Team: Otila Back as PCP - General (Internal Medicine)  Extended Emergency Contact Information Primary Emergency Contact: Berdine, Rasmusson Mobile Phone: [phone] Relation: Sister Secondary Emergency Contact: [facility] Address: [address]          [place],  [address] Home Phone: [phone] Relation: None  Code Status:  DNR  Goals of care: Advanced Directive information Advanced Directives 05/01/2018  Does Patient Have a Medical Advance Directive? Yes  Type of Advance Directive Healthcare Power of Attorney  Does patient want to make changes to medical advance directive? -  Would patient like information on creating a medical advance directive? -     Chief Complaint  Patient presents with  . Acute Visit    Hospital followup, status post admission at [facility] 6/9-6/14/19 for right-sided weakness.    HPI:  Pt is a 72 y.o. female seen today for hospital followup.  She was readmitted to [facility] on 05/06/18 following an admission at [facility] 6/9-6/14/19 for right-sided weakness. MRI brain showed left ventral pontine infarct. Right periatrial WM infarct. MRA head shows left VA occlusion. Neurology consult was done and recommended to be on ASA 81 mg daily and Plavix 75 mg daily. It was recommended that she may continue ASA and Plavix indefinitely due to PAD with previous stents. TEE was recommended and done on 6/13 did not confirm echogenic mass or intracardaic thrombus: without cardio embolic source of stroke identified. Carotid artery dopplers without hemodynamically significant stenosis. Before being transferred to the hospital, she was a long-term resident of [facility]. She has a PMH of dementia, hypertension, peripheral vascular disease, and stroke.    She was seen in the room today. She  was non verbal. She is able to move BUE and did not move BLE. She was reported to have refused breakfast yesterday.   Past Medical History:  Diagnosis Date  . Cerebral infarction Feb 2019   . Dementia   . HLD (hyperlipidemia)   . Hypertension   . Peripheral vascular disease (HCC)   . Stroke [facility])    Past Surgical History:  Procedure Laterality Date  . CHOLECYSTECTOMY    . LEG SURGERY     Per sister patient shattered leg requiring surgery with placement of hardware  . STENT PLACEMENT ILIAC ([facility] HX)    . TEE WITHOUT CARDIOVERSION N/A 05/05/2018   Procedure: TRANSESOPHAGEAL ECHOCARDIOGRAM (TEE);  Surgeon: Thurmon Fair, MD;  Location: [facility] ENDOSCOPY;  Service: Cardiovascular;  Laterality: N/A;    Allergies  Allergen Reactions  . Seroquel [Quetiapine Fumarate] Other (See Comments)    Pt coded, per her sister    Outpatient Encounter Medications as of 05/09/2018  Medication Sig  . acetaminophen (TYLENOL) 325 MG tablet Take 650 mg by mouth every 6 (six) hours as needed for mild pain, fever or headache.   . Amino Acids-Protein Hydrolys (FEEDING SUPPLEMENT, PRO-STAT SUGAR FREE 64,) LIQD Take 30 mLs by mouth daily.  Marland Kitchen amLODipine (NORVASC) 10 MG tablet Take 10 mg by mouth daily.  Marland Kitchen aspirin EC 81 MG tablet Take 1 tablet (81 mg total) by mouth daily. With Food  . atorvastatin (LIPITOR) 40 MG tablet Take 1 tablet (40 mg total) by mouth every evening.  . cholecalciferol (VITAMIN D) 1000 units tablet Take 2,000 Units by mouth daily.   . clopidogrel (PLAVIX) 75 MG tablet Take 75 mg by  mouth daily.  . cyanocobalamin (,VITAMIN B-12,) 1000 MCG/ML injection Inject 1,000 mcg into the muscle every 30 (thirty) days. On or about the 12th of each month  . donepezil (ARICEPT) 10 MG tablet Take 10 mg by mouth at bedtime.  . hydrALAZINE (APRESOLINE) 50 MG tablet Take 1 tablet (50 mg total) by mouth every 8 (eight) hours.  . mirtazapine (REMERON) 15 MG tablet Take 15 mg by mouth at bedtime.  . Multiple  Vitamins-Minerals (MULTIVITAMIN WITH MINERALS) tablet Take 1 tablet by mouth daily.  . Nutritional Supplements (NUTRITIONAL SUPPLEMENT PO) Take 1 each by mouth 2 (two) times daily. Magic Cup  . sertraline (ZOLOFT) 50 MG tablet Take 50 mg by mouth daily.   No facility-administered encounter medications on file as of 05/09/2018.     Review of Systems  Unable to obtain due to being nonverbal  I  Immunization History  Administered Date(s) Administered  . Influenza-Unspecified 08/23/2017   Pertinent  Health Maintenance Due  Topic Date Due  . MAMMOGRAM  05/02/1996  . DEXA SCAN  05/03/2011  . PNA vac Low Risk Adult (1 of 2 - PCV13) 05/03/2011  . COLONOSCOPY  09/07/2017  . INFLUENZA VACCINE  06/23/2018      Vitals:   05/09/18 0919  BP: 118/71  Pulse: 80  Resp: 18  Temp: 98.5 F (36.9 C)  TempSrc: Oral  SpO2: 98%  Weight: 151 lb (68.5 kg)  Height: 5\' 6"  (1.676 m)   Body mass index is 24.37 kg/m.  Physical Exam  GENERAL APPEARANCE:  In no acute distress.  SKIN:  Right inner buttocks X 2 pressure ulcers, left heel with black/necrotic tissue MOUTH and THROAT: Lips are without lesions. Oral mucosa is is dry RESPIRATORY: Breathing is even & unlabored, BS CTAB CARDIAC: RRR, no murmur,no extra heart sounds, no edema GI: Abdomen soft, normal BS, no masses, no tenderness EXTREMITIES:  Able to move BUE, did not move BLE PSYCHIATRIC: Non-verbal. Affect and behavior are appropriate   Labs reviewed: Recent Labs    01/25/18 0722  05/02/18 0331 05/04/18 1616 05/05/18 0500  NA 144   < > 151* 143 143  K 3.7   < > 3.2* 3.0* 3.7  CL 113*   < > 113* 111 110  CO2 21*   < > 24 25 22   GLUCOSE 113*   < > 97 128* 112*  BUN 10   < > 14 5* <5*  CREATININE 0.83   < > 0.73 0.63 0.69  CALCIUM 8.7*   < > 8.9 8.8* 8.7*  MG 2.3  --   --   --   --    < > = values in this interval not displayed.   Recent Labs    01/21/18 1510 01/22/18 0345 05/01/18 1743  AST 38 36 23  ALT 48 44 19    ALKPHOS 82 74 69  BILITOT 1.1 1.4* 1.2  PROT 6.7 6.0* 6.6  ALBUMIN 3.5 3.1* 3.4*   Recent Labs    02/22/18 04/29/18 05/01/18 1743 05/01/18 1751 05/02/18 0331 05/05/18 0500  WBC 9.8 7.2 7.4  --  10.2 8.3  NEUTROABS 6 5 4.6  --   --   --   HGB 15.8 14.4 15.9* 16.7* 14.7 13.8  HCT 48* 45 51.6* 49.0* 47.7* 43.5  MCV  --   --  92.8  --  92.8 90.4  PLT 277 231 248  --  229 183   No results found for: TSH Lab Results  Component Value  Date   HGBA1C 5.5 05/02/2018   Lab Results  Component Value Date   CHOL 161 05/02/2018   HDL 36 (L) 05/02/2018   LDLCALC 102 (H) 05/02/2018   TRIG 115 05/02/2018   CHOLHDL 4.5 05/02/2018    Significant Diagnostic Results in last 30 days:  Ct Head Wo Contrast  Result Date: 05/01/2018 CLINICAL DATA:  Altered level of consciousness, weakness, last seen normal Thursday, today unable to use RIGHT hand, history of stroke with LEFT side deficit, dementia, hypertension EXAM: CT HEAD WITHOUT CONTRAST TECHNIQUE: Contiguous axial images were obtained from the base of the skull through the vertex without intravenous contrast. Sagittal and coronal MPR images reconstructed from axial data set. COMPARISON:  01/21/2018 FINDINGS: Brain: Generalized atrophy. Normal ventricular morphology. No midline shift or mass effect. Small vessel chronic ischemic changes of deep cerebral white matter. Multiple old infarcts involving the basal ganglia and thalami bilaterally, question mildly increased in thalami since prior exam particularly on RIGHT. No intracranial hemorrhage, mass lesion, evidence of acute infarction, or extra-axial fluid collection. Vascular: Atherosclerotic calcifications of the internal carotid arteries at the skull base. Skull: Intact Sinuses/Orbits: Clear Other: N/A IMPRESSION: Atrophy with small vessel chronic ischemic changes of deep cerebral white matter. Lacunar infarcts in BILATERAL basal ganglia and thalami, question mildly progressive in thalami especially  RIGHT since prior exam. Electronically Signed   By: Ulyses Southward M.D.   On: 05/01/2018 18:29   Mr Brain Wo Contrast  Result Date: 05/02/2018 CLINICAL DATA:  Initial evaluation for acute right-sided weakness. EXAM: MRI HEAD WITHOUT CONTRAST MRA HEAD WITHOUT CONTRAST TECHNIQUE: Multiplanar, multiecho pulse sequences of the brain and surrounding structures were obtained without intravenous contrast. Angiographic images of the head were obtained using MRA technique without contrast. COMPARISON:  Prior CT from 05/01/2018 as well as previous MRI from 12/30/2017. FINDINGS: MRI HEAD FINDINGS Brain: Advanced cerebral atrophy for age. Extensive patchy and confluent T2/FLAIR hyperintensity throughout the periventricular and deep white matter both cerebral hemispheres, consistent with chronic microvascular ischemic disease, fairly advanced in nature. Chronic microvascular ischemic changes extend into the brainstem and middle cerebellar peduncles as well. Multiple superimposed remote lacunar infarcts present within the hemispheric cerebral white matter, deep gray nuclei, and brainstem as well as the right middle cerebellar peduncle. Innumerable chronic micro hemorrhages again seen, primarily clustered around the deep gray nuclei and cerebellum, favored to reflect sequelae of poorly controlled hypertension. 6 mm acute ischemic infarct present at the right ventral pons (series 5, image 53). Probable additional faint subacute 6 mm ischemia infarct within the right periatrial white matter (series 5, image 60). No associated hemorrhage or mass effect. No other evidence for acute or subacute ischemia. No mass lesion, midline shift or mass effect. Diffuse ventricular prominence related to global parenchymal volume loss of hydrocephalus. No extra-axial fluid collection. Pituitary gland within normal limits. Vascular: Abnormal flow void within the left V4 segment, likely occluded. The major intracranial vascular flow voids otherwise  maintained. Skull and upper cervical spine: Craniocervical junction within normal limits. Upper cervical spine demonstrates no significant finding. Bone marrow signal intensity within normal limits. No scalp soft tissue abnormality. Sinuses/Orbits: Globes and orbital soft tissues within normal limits. Patient status post lens extraction on the left. Paranasal sinuses are clear. Small volume opacity within the mastoid air cells bilaterally, of doubtful significance. Inner ear structures normal. Other: None. MRA HEAD FINDINGS ANTERIOR CIRCULATION: Distal examination degraded by motion artifact. Distal cervical segments of the internal carotid arteries are patent with antegrade flow. Petrous segments  patent bilaterally. Scattered atheromatous irregularity within the cavernous/supraclinoid ICAs without high-grade stenosis, left worse than right. A1 segments patent bilaterally anterior communicating artery grossly normal. Anterior cerebral arteries irregular but patent to their distal aspects without definite stenosis. M1 segments mildly irregular but patent without high-grade stenosis. Right MCA bifurcates early. Severe proximal left M2 stenoses noted (series 1045, image 8). Advanced small vessel atheromatous irregularity throughout the MCA branches distally. POSTERIOR CIRCULATION: Visualized right vertebral artery patent to the vertebrobasilar junction without stenosis. Left V4 segment not visualized, occluded. Slight retrograde filling into the distal left V4 segment. Right PICA patent proximally. Left PICA not visualized. Basilar irregular but patent to its distal aspect without flow-limiting stenosis. Superior cerebral arteries patent proximally. Probable severe left SCA stenosis noted (series 1037, image 9). Both of the PCAs arise from the basilar artery. Moderate right P1 stenosis (series 1037, image 9). Additional moderate distal right P2 stenosis (series 1037, image 16). Additional atheromatous irregularity  throughout the remaining PCAs bilaterally, which do remain patent to their distal aspects. No aneurysm. IMPRESSION: MRI HEAD IMPRESSION: 1. 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 2. 6 mm subacute small vessel white matter infarct involving the right periatrial white matter. 3. Atrophy with severe chronic microvascular ischemic disease. 4. Innumerable chronic micro hemorrhages throughout the brain, favored to be secondary to poorly controlled hypertension. MRA HEAD IMPRESSION: 1. Occlusion of the left vertebral artery. 2. Advanced atheromatous change throughout the intracranial circulation. Notable findings include severe proximal left M2 stenoses, severe left SCA stenosis, with moderate right P1 and P2 stenoses. Electronically Signed   By: Rise Mu M.D.   On: 05/02/2018 03:18   Dg Chest Port 1 View  Result Date: 05/01/2018 CLINICAL DATA:  RIGHT-side weakness, last seen normal on Thursday EXAM: PORTABLE CHEST 1 VIEW COMPARISON:  Portable exam 1732 hours compared to 01/21/2018 FINDINGS: Normal heart size, mediastinal contours, and pulmonary vascularity. Mild central peribronchial thickening and minimal LEFT basilar atelectasis. Lungs otherwise clear. No pulmonary infiltrate, pleural effusion or pneumothorax. Bones demineralized. IMPRESSION: Mild bronchitic changes with minimal LEFT basilar atelectasis. Electronically Signed   By: Ulyses Southward M.D.   On: 05/01/2018 18:02   Dg Swallowing Func-speech Pathology  Result Date: 05/02/2018 Objective Swallowing Evaluation: Type of Study: MBS-Modified Barium Swallow Study  Patient Details Name: Morgan Sutton MRN: [id number] Date of Birth: [birth year] Today's Date: 05/02/2018 Time: SLP Start Time (ACUTE ONLY): 1459 -SLP Stop Time (ACUTE ONLY): 1517 SLP Time Calculation (min) (ACUTE ONLY): 18 min Past Medical History: Past Medical History: Diagnosis Date . Cerebral infarction Feb 2019  . Dementia  . HLD (hyperlipidemia)  . Hypertension  .  Peripheral vascular disease (HCC)  . Stroke [facility])  Past Surgical History: Past Surgical History: Procedure Laterality Date . CHOLECYSTECTOMY   . LEG SURGERY    Per sister patient shattered leg requiring surgery with placement of hardware . STENT PLACEMENT ILIAC ([facility] HX)   HPI: Pt is a 72 y.o. right-handed female with medical history significant for HTN, HLD, CAD, CVA with residual left-sided weakness, dysphagia on pured diet, dementia, remote history of CAD, PVD with bilateral iliac stents in 2017, who presents from [facility] with complaints of new right-sided weakness, decreased speech output. MRI shows acute R pontine infarct. Prior BSE 01/22/18 recommended Dys 3 diet and thin liquids due to altered mentation with recommendation to advance back to regular textures as mentation returned to baseline.  Subjective: pt alert, cooperative, minimally conversant but following commands Assessment / Plan / Recommendation CHL  IP CLINICAL IMPRESSIONS 05/02/2018 Clinical Impression Pt has a moderate oral dysphagia characterized by weak labial seal, lingual manipulation, and posterior propulsion. She has limited ability to masticate soft solids, holding boluses anteriorly with small munching pattern without fully forming a cohesive bolus. Minimal amounts of soft solids were able to reach the pharynx to be swallowed, with the rest of the residue manually removed by SLP despite Max cues for lingual sweep and pureed/liquid washes. She has baseline left-sided weakness as well as new right-sided weakness with residue pocketed bilaterally. Although still prolonged, her oral phase is more effective with purees and thin liquids, and when she swallows she has good airway protection (intermittent flash penetration only with thin liquids) and pharyngeal efficiency. Recommend starting Dys 1 diet and thin liquids with careful monitoring for oral residue. SLP Visit Diagnosis Dysphagia, oral phase (R13.11) Attention and concentration deficit  following -- Frontal lobe and executive function deficit following -- Impact on safety and function Mild aspiration risk;Moderate aspiration risk   CHL IP TREATMENT RECOMMENDATION 05/02/2018 Treatment Recommendations Therapy as outlined in treatment plan below   Prognosis 05/02/2018 Prognosis for Safe Diet Advancement (No Data) Barriers to Reach Goals Cognitive deficits Barriers/Prognosis Comment -- CHL IP DIET RECOMMENDATION 05/02/2018 SLP Diet Recommendations Dysphagia 1 (Puree) solids;Thin liquid Liquid Administration via Straw Medication Administration Crushed with puree Compensations Minimize environmental distractions;Slow rate;Small sips/bites;Lingual sweep for clearance of pocketing;Monitor for anterior loss Postural Changes Seated upright at 90 degrees   CHL IP OTHER RECOMMENDATIONS 05/02/2018 Recommended Consults -- Oral Care Recommendations Oral care BID Other Recommendations Have oral suction available   CHL IP FOLLOW UP RECOMMENDATIONS 05/02/2018 Follow up Recommendations Skilled Nursing facility   [facility] IP FREQUENCY AND DURATION 05/02/2018 Speech Therapy Frequency (ACUTE ONLY) min 2x/week Treatment Duration 2 weeks      CHL IP ORAL PHASE 05/02/2018 Oral Phase Impaired Oral - Pudding Teaspoon -- Oral - Pudding Cup -- Oral - Honey Teaspoon -- Oral - Honey Cup -- Oral - Nectar Teaspoon -- Oral - Nectar Cup -- Oral - Nectar Straw -- Oral - Thin Teaspoon -- Oral - Thin Cup -- Oral - Thin Straw Weak lingual manipulation;Delayed oral transit Oral - Puree Weak lingual manipulation;Delayed oral transit;Right pocketing in lateral sulci;Left pocketing in lateral sulci;Lingual/palatal residue Oral - Mech Soft Weak lingual manipulation;Delayed oral transit;Right pocketing in lateral sulci;Left pocketing in lateral sulci;Lingual/palatal residue;Impaired mastication;Pocketing in anterior sulcus Oral - Regular -- Oral - Multi-Consistency -- Oral - Pill -- Oral Phase - Comment --  CHL IP PHARYNGEAL PHASE 05/02/2018 Pharyngeal  Phase WFL Pharyngeal- Pudding Teaspoon -- Pharyngeal -- Pharyngeal- Pudding Cup -- Pharyngeal -- Pharyngeal- Honey Teaspoon -- Pharyngeal -- Pharyngeal- Honey Cup -- Pharyngeal -- Pharyngeal- Nectar Teaspoon -- Pharyngeal -- Pharyngeal- Nectar Cup -- Pharyngeal -- Pharyngeal- Nectar Straw -- Pharyngeal -- Pharyngeal- Thin Teaspoon -- Pharyngeal -- Pharyngeal- Thin Cup -- Pharyngeal -- Pharyngeal- Thin Straw -- Pharyngeal -- Pharyngeal- Puree -- Pharyngeal -- Pharyngeal- Mechanical Soft -- Pharyngeal -- Pharyngeal- Regular -- Pharyngeal -- Pharyngeal- Multi-consistency -- Pharyngeal -- Pharyngeal- Pill -- Pharyngeal -- Pharyngeal Comment --  CHL IP CERVICAL ESOPHAGEAL PHASE 05/02/2018 Cervical Esophageal Phase WFL Pudding Teaspoon -- Pudding Cup -- Honey Teaspoon -- Honey Cup -- Nectar Teaspoon -- Nectar Cup -- Nectar Straw -- Thin Teaspoon -- Thin Cup -- Thin Straw -- Puree -- Mechanical Soft -- Regular -- Multi-consistency -- Pill -- Cervical Esophageal Comment -- No flowsheet data found. Maxcine Ham 05/02/2018, 4:01 PM  Maxcine Ham, M.A. CCC-SLP [phone]  Mr Shirlee Latch ZO Contrast  Result Date: 05/02/2018 CLINICAL DATA:  Initial evaluation for acute right-sided weakness. EXAM: MRI HEAD WITHOUT CONTRAST MRA HEAD WITHOUT CONTRAST TECHNIQUE: Multiplanar, multiecho pulse sequences of the brain and surrounding structures were obtained without intravenous contrast. Angiographic images of the head were obtained using MRA technique without contrast. COMPARISON:  Prior CT from 05/01/2018 as well as previous MRI from 12/30/2017. FINDINGS: MRI HEAD FINDINGS Brain: Advanced cerebral atrophy for age. Extensive patchy and confluent T2/FLAIR hyperintensity throughout the periventricular and deep white matter both cerebral hemispheres, consistent with chronic microvascular ischemic disease, fairly advanced in nature. Chronic microvascular ischemic changes extend into the brainstem and middle cerebellar  peduncles as well. Multiple superimposed remote lacunar infarcts present within the hemispheric cerebral white matter, deep gray nuclei, and brainstem as well as the right middle cerebellar peduncle. Innumerable chronic micro hemorrhages again seen, primarily clustered around the deep gray nuclei and cerebellum, favored to reflect sequelae of poorly controlled hypertension. 6 mm acute ischemic infarct present at the right ventral pons (series 5, image 53). Probable additional faint subacute 6 mm ischemia infarct within the right periatrial white matter (series 5, image 60). No associated hemorrhage or mass effect. No other evidence for acute or subacute ischemia. No mass lesion, midline shift or mass effect. Diffuse ventricular prominence related to global parenchymal volume loss of hydrocephalus. No extra-axial fluid collection. Pituitary gland within normal limits. Vascular: Abnormal flow void within the left V4 segment, likely occluded. The major intracranial vascular flow voids otherwise maintained. Skull and upper cervical spine: Craniocervical junction within normal limits. Upper cervical spine demonstrates no significant finding. Bone marrow signal intensity within normal limits. No scalp soft tissue abnormality. Sinuses/Orbits: Globes and orbital soft tissues within normal limits. Patient status post lens extraction on the left. Paranasal sinuses are clear. Small volume opacity within the mastoid air cells bilaterally, of doubtful significance. Inner ear structures normal. Other: None. MRA HEAD FINDINGS ANTERIOR CIRCULATION: Distal examination degraded by motion artifact. Distal cervical segments of the internal carotid arteries are patent with antegrade flow. Petrous segments patent bilaterally. Scattered atheromatous irregularity within the cavernous/supraclinoid ICAs without high-grade stenosis, left worse than right. A1 segments patent bilaterally anterior communicating artery grossly normal. Anterior  cerebral arteries irregular but patent to their distal aspects without definite stenosis. M1 segments mildly irregular but patent without high-grade stenosis. Right MCA bifurcates early. Severe proximal left M2 stenoses noted (series 1045, image 8). Advanced small vessel atheromatous irregularity throughout the MCA branches distally. POSTERIOR CIRCULATION: Visualized right vertebral artery patent to the vertebrobasilar junction without stenosis. Left V4 segment not visualized, occluded. Slight retrograde filling into the distal left V4 segment. Right PICA patent proximally. Left PICA not visualized. Basilar irregular but patent to its distal aspect without flow-limiting stenosis. Superior cerebral arteries patent proximally. Probable severe left SCA stenosis noted (series 1037, image 9). Both of the PCAs arise from the basilar artery. Moderate right P1 stenosis (series 1037, image 9). Additional moderate distal right P2 stenosis (series 1037, image 16). Additional atheromatous irregularity throughout the remaining PCAs bilaterally, which do remain patent to their distal aspects. No aneurysm. IMPRESSION: MRI HEAD IMPRESSION: 1. 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 2. 6 mm subacute small vessel white matter infarct involving the right periatrial white matter. 3. Atrophy with severe chronic microvascular ischemic disease. 4. Innumerable chronic micro hemorrhages throughout the brain, favored to be secondary to poorly controlled hypertension. MRA HEAD IMPRESSION: 1. Occlusion of the left vertebral artery. 2. Advanced atheromatous  change throughout the intracranial circulation. Notable findings include severe proximal left M2 stenoses, severe left SCA stenosis, with moderate right P1 and P2 stenoses. Electronically Signed   By: Rise Mu M.D.   On: 05/02/2018 03:18    Assessment/Plan  1. Cerebrovascular accident (CVA), unspecified mechanism (HCC) -  MRI brain showed left  ventral pontine infarct. Right periatrial WM infarct. MRA head shows left VA occlusion. Neurology consult was done and recommended to be on ASA 81 mg daily and Plavix 75 mg daily. It was recommended that she may continue ASA and Plavix indefinitely due to PAD with previous stents. TEE was recommended and done on 6/13 did not confirm echogenic mass or intracardaic thrombus: without cardio embolic source of stroke identified. Carotid artery dopplers without hemodynamically significant stenosis, continue lipitor 40 mg 1 tab daily   2. Essential hypertension - stable, continue amlodipine 10 mg 1 tab daily, Hydralazine 50 mg 1 tab every 8 hours   3. PAD (peripheral artery disease) (HCC) - had previous stents, continue Plavix75 mg 1 tab daily and aspirin 81 mg 1 tab daily   4. Pressure injury of right buttock, stage 2 - no infection noted, continue treatment daily, turn to sides   5. Pressure injury of left heel, unstageable (HCC) - ABI ordered, continue wound treatment, float heels   6. Depression, unspecified depression type - mood id stable, continue sertraline 50 mg 1 tab mirtazapine 15 mg 1 tab daily at bedtime, psych consult   7. Vascular dementia without behavioral disturbance - continue donepezil 10 mg 1 tab daily at bedtime, supportive care, fall precautions, oral care BID    Family/ staff Communication: Discussed plan of care with resident.  Labs/tests ordered:  CMP and CBC in 1 week, ABI on left foot  Goals of care:   Long-term care.   Kenard Gower, NP [facility] [phone] (Monday-Friday 8:00 a.m. - 5:00 p.m.) [phone] (after hours)

## 2018-05-10 ENCOUNTER — Encounter: Payer: Self-pay | Admitting: Internal Medicine

## 2018-05-10 DIAGNOSIS — F0390 Unspecified dementia without behavioral disturbance: Secondary | ICD-10-CM | POA: Diagnosis not present

## 2018-05-10 DIAGNOSIS — F323 Major depressive disorder, single episode, severe with psychotic features: Secondary | ICD-10-CM | POA: Diagnosis not present

## 2018-05-10 DIAGNOSIS — R630 Anorexia: Secondary | ICD-10-CM | POA: Diagnosis not present

## 2018-05-10 NOTE — Progress Notes (Deleted)
    NURSING HOME LOCATION:  [facility] ROOM NUMBER:  [id number]  CODE STATUS:    PCP:  Eunice Blase'Buch, Greta, PA-C  [address] [place] [phone]  This is a Nursing Facility readmission within 30 days.  Interim medical record and care since last [facility] visit was updated with review of diagnostic studies and change in clinical status since last visit were documented.  HPI:    Review of systems: Dementia invalidated responses. Date given as   Constitutional: No fever, significant weight change, fatigue  Eyes: No redness, discharge, pain, vision change ENT/mouth: No nasal congestion,  purulent discharge, earache, change in hearing, sore throat  Cardiovascular: No chest pain, palpitations, paroxysmal nocturnal dyspnea, claudication, edema  Respiratory: No cough, sputum production, hemoptysis, DOE, significant snoring, apnea   Gastrointestinal: No heartburn, dysphagia, abdominal pain, nausea /vomiting, rectal bleeding, melena, change in bowels Genitourinary: No dysuria, hematuria, pyuria, incontinence, nocturia Musculoskeletal: No joint stiffness, joint swelling, weakness, pain Dermatologic: No rash, pruritus, change in appearance of skin Neurologic: No dizziness, headache, syncope, seizures, numbness, tingling Psychiatric: No significant anxiety, depression, insomnia, anorexia Endocrine: No change in hair/skin/nails, excessive thirst, excessive hunger, excessive urination  Hematologic/lymphatic: No significant bruising, lymphadenopathy, abnormal bleeding Allergy/immunology: No itchy/watery eyes, significant sneezing, urticaria, angioedema  Physical exam:  Pertinent or positive findings: General appearance: Adequately nourished; no acute distress, increased work of breathing is present.   Lymphatic: No lymphadenopathy about the head, neck, axilla. Eyes: No conjunctival inflammation or lid edema is present. There is no scleral icterus. Ears:  External ear exam  shows no significant lesions or deformities.   Nose:  External nasal examination shows no deformity or inflammation. Nasal mucosa are pink and moist without lesions, exudates Oral exam:  Lips and gums are healthy appearing. There is no oropharyngeal erythema or exudate. Neck:  No thyromegaly, masses, tenderness noted.    Heart:  Normal rate and regular rhythm. S1 and S2 normal without gallop, murmur, click, rub .  Lungs: Chest clear to auscultation without wheezes, rhonchi, rales, rubs. Abdomen: Bowel sounds are normal. Abdomen is soft and nontender with no organomegaly, hernias, masses. GU: Deferred  Extremities:  No cyanosis, clubbing, edema  Neurologic exam: Cn 2-7 intact Strength equal in upper & lower extremities Balance, Rhomberg, finger to nose testing could not be completed due to clinical state Deep tendon reflexes are equal Skin: Warm & dry w/o tenting. No significant lesions or rash.  See summary under each active problem in the Problem List with associated updated therapeutic plan

## 2018-05-11 DIAGNOSIS — L89890 Pressure ulcer of other site, unstageable: Secondary | ICD-10-CM | POA: Diagnosis not present

## 2018-05-12 ENCOUNTER — Encounter: Payer: Self-pay | Admitting: Adult Health

## 2018-05-12 ENCOUNTER — Non-Acute Institutional Stay (SKILLED_NURSING_FACILITY): Payer: Medicare Other | Admitting: Adult Health

## 2018-05-12 DIAGNOSIS — E785 Hyperlipidemia, unspecified: Secondary | ICD-10-CM | POA: Diagnosis not present

## 2018-05-12 DIAGNOSIS — L89620 Pressure ulcer of left heel, unstageable: Secondary | ICD-10-CM | POA: Diagnosis not present

## 2018-05-12 DIAGNOSIS — F339 Major depressive disorder, recurrent, unspecified: Secondary | ICD-10-CM | POA: Diagnosis not present

## 2018-05-12 DIAGNOSIS — I10 Essential (primary) hypertension: Secondary | ICD-10-CM | POA: Diagnosis not present

## 2018-05-12 DIAGNOSIS — L89009 Pressure ulcer of unspecified elbow, unspecified stage: Secondary | ICD-10-CM | POA: Diagnosis not present

## 2018-05-12 DIAGNOSIS — I69920 Aphasia following unspecified cerebrovascular disease: Secondary | ICD-10-CM | POA: Diagnosis not present

## 2018-05-12 DIAGNOSIS — I739 Peripheral vascular disease, unspecified: Secondary | ICD-10-CM | POA: Diagnosis not present

## 2018-05-12 DIAGNOSIS — R1310 Dysphagia, unspecified: Secondary | ICD-10-CM | POA: Diagnosis not present

## 2018-05-12 DIAGNOSIS — I63311 Cerebral infarction due to thrombosis of right middle cerebral artery: Secondary | ICD-10-CM | POA: Diagnosis not present

## 2018-05-12 DIAGNOSIS — F0281 Dementia in other diseases classified elsewhere with behavioral disturbance: Secondary | ICD-10-CM | POA: Diagnosis not present

## 2018-05-12 DIAGNOSIS — I679 Cerebrovascular disease, unspecified: Secondary | ICD-10-CM | POA: Diagnosis not present

## 2018-05-12 DIAGNOSIS — R634 Abnormal weight loss: Secondary | ICD-10-CM | POA: Diagnosis not present

## 2018-05-12 DIAGNOSIS — G619 Inflammatory polyneuropathy, unspecified: Secondary | ICD-10-CM | POA: Diagnosis not present

## 2018-05-12 DIAGNOSIS — I25119 Atherosclerotic heart disease of native coronary artery with unspecified angina pectoris: Secondary | ICD-10-CM | POA: Diagnosis not present

## 2018-05-12 NOTE — Progress Notes (Signed)
Location:  [facility] Room Number: 201-B Place of Service:  SNF (31) Provider:  Kenard Gower, NP  Patient Care Team: Otila Back as PCP - General (Internal Medicine)  Extended Emergency Contact Information Primary Emergency Contact: Ryliegh, Mcduffey Mobile Phone: [phone] Relation: Sister Secondary Emergency Contact: [facility] Address: [address]          [place],  [address] Home Phone: [phone] Relation: None  Code Status:  DNR  Goals of care: Advanced Directive information Advanced Directives 05/09/2018  Does Patient Have a Medical Advance Directive? Yes  Type of Advance Directive Out of facility DNR (pink MOST or yellow form)  Does patient want to make changes to medical advance directive? No - Patient declined  Would patient like information on creating a medical advance directive? -     Chief Complaint  Patient presents with  . Acute Visit    The patient has ischemia documented on ABIs.    HPI:  Pt is a 72 y.o. female seen today for an acute visit secondary to ischemia noted on ABIs. She has left heel unstageable pressure ulcer on left heel. ABI done and result showed bilateral ankle moderate perfusion ischemia. She was seen in the room today. She was able to say her name today and has good eye contact. She is not able to move BLE. She was recently re-admitted to [facility] for CVA. She is a long-term care resident of [facility]. She has a PMH of dementia, hypertension, peripheral vascular disease, and stroke.    Past Medical History:  Diagnosis Date  . Cerebral infarction Feb 2019   . Dementia   . HLD (hyperlipidemia)   . Hypertension   . Peripheral vascular disease (HCC)   . Stroke [facility])    Past Surgical History:  Procedure Laterality Date  . CHOLECYSTECTOMY    . LEG SURGERY     Per sister patient shattered leg requiring surgery with placement of hardware  .  STENT PLACEMENT ILIAC ([facility] HX)    . TEE WITHOUT CARDIOVERSION N/A 05/05/2018   Procedure: TRANSESOPHAGEAL ECHOCARDIOGRAM (TEE);  Surgeon: Thurmon Fair, MD;  Location: [facility] ENDOSCOPY;  Service: Cardiovascular;  Laterality: N/A;    Allergies  Allergen Reactions  . Seroquel [Quetiapine Fumarate] Other (See Comments)    Pt coded, per her sister    Outpatient Encounter Medications as of 05/12/2018  Medication Sig  . acetaminophen (TYLENOL) 325 MG tablet Take 650 mg by mouth every 6 (six) hours as needed for mild pain, fever or headache.   . Amino Acids-Protein Hydrolys (FEEDING SUPPLEMENT, PRO-STAT SUGAR FREE 64,) LIQD Take 30 mLs by mouth daily.  Marland Kitchen amLODipine (NORVASC) 10 MG tablet Take 10 mg by mouth daily.  Marland Kitchen aspirin EC 81 MG tablet Take 1 tablet (81 mg total) by mouth daily. With Food  . atorvastatin (LIPITOR) 40 MG tablet Take 1 tablet (40 mg total) by mouth every evening.  . cholecalciferol (VITAMIN D) 1000 units tablet Take 2,000 Units by mouth daily.   . clopidogrel (PLAVIX) 75 MG tablet Take 75 mg by mouth daily.  Marland Kitchen donepezil (ARICEPT) 10 MG tablet Take 10 mg by mouth at bedtime.  . hydrALAZINE (APRESOLINE) 50 MG tablet Take 1 tablet (50 mg total) by mouth every 8 (eight) hours.  . mirtazapine (REMERON) 15 MG tablet Take 15 mg by mouth at bedtime.  . Multiple Vitamins-Minerals (MULTIVITAMIN WITH MINERALS) tablet Take 1 tablet by mouth daily.  . Nutritional Supplements (NUTRITIONAL SUPPLEMENT  PO) Take 1 each by mouth 2 (two) times daily. Magic Cup  . ziprasidone (GEODON) 20 MG capsule Take 20 mg by mouth daily.  . [DISCONTINUED] cyanocobalamin (,VITAMIN B-12,) 1000 MCG/ML injection Inject 1,000 mcg into the muscle every 30 (thirty) days. On or about the 12th of each month  . [DISCONTINUED] sertraline (ZOLOFT) 50 MG tablet Take 50 mg by mouth daily.   No facility-administered encounter medications on file as of 05/12/2018.     Review of Systems Unable to obtain due to nonverbal and  was only able to say her first name today in a very soft voice.    Immunization History  Administered Date(s) Administered  . Influenza-Unspecified 08/23/2017   Pertinent  Health Maintenance Due  Topic Date Due  . MAMMOGRAM  05/02/1996  . DEXA SCAN  05/03/2011  . PNA vac Low Risk Adult (1 of 2 - PCV13) 05/03/2011  . COLONOSCOPY  09/07/2017  . INFLUENZA VACCINE  06/23/2018      Vitals:   05/12/18 1211  BP: 112/69  Pulse: 68  Resp: 18  Temp: (!) 96.6 F (35.9 C)  TempSrc: Oral  SpO2: 96%  Weight: 151 lb (68.5 kg)  Height: 5\' 6"  (1.676 m)   Body mass index is 24.37 kg/m.  Physical Exam  GENERAL APPEARANCE: Well nourished. In no acute distress. Normal body habitus SKIN:  Left heel with necrotic tissue, right inner buttock with pressure ulcer X 2 MOUTH and THROAT: Lips are without lesions. Oral mucosa is moist and without lesions.  RESPIRATORY: Breathing is even & unlabored, BS CTAB CARDIAC: RRR, no murmur,no extra heart sounds, no edema GI: Abdomen soft, normal BS, no masses, no tenderness EXTREMITIES:  Able to move BUE, unable to move BLE NEUROLOGICAL: There is no tremor. Mostly nonverbal PSYCHIATRIC:  Affect and behavior are appropriate  Labs reviewed: Recent Labs    01/25/18 0722  05/02/18 0331 05/04/18 1616 05/05/18 0500  NA 144   < > 151* 143 143  K 3.7   < > 3.2* 3.0* 3.7  CL 113*   < > 113* 111 110  CO2 21*   < > 24 25 22   GLUCOSE 113*   < > 97 128* 112*  BUN 10   < > 14 5* <5*  CREATININE 0.83   < > 0.73 0.63 0.69  CALCIUM 8.7*   < > 8.9 8.8* 8.7*  MG 2.3  --   --   --   --    < > = values in this interval not displayed.   Recent Labs    01/21/18 1510 01/22/18 0345 05/01/18 1743  AST 38 36 23  ALT 48 44 19  ALKPHOS 82 74 69  BILITOT 1.1 1.4* 1.2  PROT 6.7 6.0* 6.6  ALBUMIN 3.5 3.1* 3.4*   Recent Labs    02/22/18 04/29/18 05/01/18 1743 05/01/18 1751 05/02/18 0331 05/05/18 0500  WBC 9.8 7.2 7.4  --  10.2 8.3  NEUTROABS 6 5 4.6  --    --   --   HGB 15.8 14.4 15.9* 16.7* 14.7 13.8  HCT 48* 45 51.6* 49.0* 47.7* 43.5  MCV  --   --  92.8  --  92.8 90.4  PLT 277 231 248  --  229 183    Lab Results  Component Value Date   HGBA1C 5.5 05/02/2018   Lab Results  Component Value Date   CHOL 161 05/02/2018   HDL 36 (L) 05/02/2018   LDLCALC 102 (H) 05/02/2018  TRIG 115 05/02/2018   CHOLHDL 4.5 05/02/2018    Significant Diagnostic Results in last 30 days:  Ct Head Wo Contrast  Result Date: 05/01/2018 CLINICAL DATA:  Altered level of consciousness, weakness, last seen normal Thursday, today unable to use RIGHT hand, history of stroke with LEFT side deficit, dementia, hypertension EXAM: CT HEAD WITHOUT CONTRAST TECHNIQUE: Contiguous axial images were obtained from the base of the skull through the vertex without intravenous contrast. Sagittal and coronal MPR images reconstructed from axial data set. COMPARISON:  01/21/2018 FINDINGS: Brain: Generalized atrophy. Normal ventricular morphology. No midline shift or mass effect. Small vessel chronic ischemic changes of deep cerebral white matter. Multiple old infarcts involving the basal ganglia and thalami bilaterally, question mildly increased in thalami since prior exam particularly on RIGHT. No intracranial hemorrhage, mass lesion, evidence of acute infarction, or extra-axial fluid collection. Vascular: Atherosclerotic calcifications of the internal carotid arteries at the skull base. Skull: Intact Sinuses/Orbits: Clear Other: N/A IMPRESSION: Atrophy with small vessel chronic ischemic changes of deep cerebral white matter. Lacunar infarcts in BILATERAL basal ganglia and thalami, question mildly progressive in thalami especially RIGHT since prior exam. Electronically Signed   By: Ulyses Southward M.D.   On: 05/01/2018 18:29   Mr Brain Wo Contrast  Result Date: 05/02/2018 CLINICAL DATA:  Initial evaluation for acute right-sided weakness. EXAM: MRI HEAD WITHOUT CONTRAST MRA HEAD WITHOUT CONTRAST  TECHNIQUE: Multiplanar, multiecho pulse sequences of the brain and surrounding structures were obtained without intravenous contrast. Angiographic images of the head were obtained using MRA technique without contrast. COMPARISON:  Prior CT from 05/01/2018 as well as previous MRI from 12/30/2017. FINDINGS: MRI HEAD FINDINGS Brain: Advanced cerebral atrophy for age. Extensive patchy and confluent T2/FLAIR hyperintensity throughout the periventricular and deep white matter both cerebral hemispheres, consistent with chronic microvascular ischemic disease, fairly advanced in nature. Chronic microvascular ischemic changes extend into the brainstem and middle cerebellar peduncles as well. Multiple superimposed remote lacunar infarcts present within the hemispheric cerebral white matter, deep gray nuclei, and brainstem as well as the right middle cerebellar peduncle. Innumerable chronic micro hemorrhages again seen, primarily clustered around the deep gray nuclei and cerebellum, favored to reflect sequelae of poorly controlled hypertension. 6 mm acute ischemic infarct present at the right ventral pons (series 5, image 53). Probable additional faint subacute 6 mm ischemia infarct within the right periatrial white matter (series 5, image 60). No associated hemorrhage or mass effect. No other evidence for acute or subacute ischemia. No mass lesion, midline shift or mass effect. Diffuse ventricular prominence related to global parenchymal volume loss of hydrocephalus. No extra-axial fluid collection. Pituitary gland within normal limits. Vascular: Abnormal flow void within the left V4 segment, likely occluded. The major intracranial vascular flow voids otherwise maintained. Skull and upper cervical spine: Craniocervical junction within normal limits. Upper cervical spine demonstrates no significant finding. Bone marrow signal intensity within normal limits. No scalp soft tissue abnormality. Sinuses/Orbits: Globes and orbital  soft tissues within normal limits. Patient status post lens extraction on the left. Paranasal sinuses are clear. Small volume opacity within the mastoid air cells bilaterally, of doubtful significance. Inner ear structures normal. Other: None. MRA HEAD FINDINGS ANTERIOR CIRCULATION: Distal examination degraded by motion artifact. Distal cervical segments of the internal carotid arteries are patent with antegrade flow. Petrous segments patent bilaterally. Scattered atheromatous irregularity within the cavernous/supraclinoid ICAs without high-grade stenosis, left worse than right. A1 segments patent bilaterally anterior communicating artery grossly normal. Anterior cerebral arteries irregular but patent to their  distal aspects without definite stenosis. M1 segments mildly irregular but patent without high-grade stenosis. Right MCA bifurcates early. Severe proximal left M2 stenoses noted (series 1045, image 8). Advanced small vessel atheromatous irregularity throughout the MCA branches distally. POSTERIOR CIRCULATION: Visualized right vertebral artery patent to the vertebrobasilar junction without stenosis. Left V4 segment not visualized, occluded. Slight retrograde filling into the distal left V4 segment. Right PICA patent proximally. Left PICA not visualized. Basilar irregular but patent to its distal aspect without flow-limiting stenosis. Superior cerebral arteries patent proximally. Probable severe left SCA stenosis noted (series 1037, image 9). Both of the PCAs arise from the basilar artery. Moderate right P1 stenosis (series 1037, image 9). Additional moderate distal right P2 stenosis (series 1037, image 16). Additional atheromatous irregularity throughout the remaining PCAs bilaterally, which do remain patent to their distal aspects. No aneurysm. IMPRESSION: MRI HEAD IMPRESSION: 1. 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 2. 6 mm subacute small vessel white matter infarct involving the  right periatrial white matter. 3. Atrophy with severe chronic microvascular ischemic disease. 4. Innumerable chronic micro hemorrhages throughout the brain, favored to be secondary to poorly controlled hypertension. MRA HEAD IMPRESSION: 1. Occlusion of the left vertebral artery. 2. Advanced atheromatous change throughout the intracranial circulation. Notable findings include severe proximal left M2 stenoses, severe left SCA stenosis, with moderate right P1 and P2 stenoses. Electronically Signed   By: Rise Mu M.D.   On: 05/02/2018 03:18   Dg Chest Port 1 View  Result Date: 05/01/2018 CLINICAL DATA:  RIGHT-side weakness, last seen normal on Thursday EXAM: PORTABLE CHEST 1 VIEW COMPARISON:  Portable exam 1732 hours compared to 01/21/2018 FINDINGS: Normal heart size, mediastinal contours, and pulmonary vascularity. Mild central peribronchial thickening and minimal LEFT basilar atelectasis. Lungs otherwise clear. No pulmonary infiltrate, pleural effusion or pneumothorax. Bones demineralized. IMPRESSION: Mild bronchitic changes with minimal LEFT basilar atelectasis. Electronically Signed   By: Ulyses Southward M.D.   On: 05/01/2018 18:02   Dg Swallowing Func-speech Pathology  Result Date: 05/02/2018 Objective Swallowing Evaluation: Type of Study: MBS-Modified Barium Swallow Study  Patient Details Name: ANTONAE ZBIKOWSKI MRN: [id number] Date of Birth: 1946-09-05 Today's Date: 05/02/2018 Time: SLP Start Time (ACUTE ONLY): 1459 -SLP Stop Time (ACUTE ONLY): 1517 SLP Time Calculation (min) (ACUTE ONLY): 18 min Past Medical History: Past Medical History: Diagnosis Date . Cerebral infarction Feb 2019  . Dementia  . HLD (hyperlipidemia)  . Hypertension  . Peripheral vascular disease (HCC)  . Stroke [facility])  Past Surgical History: Past Surgical History: Procedure Laterality Date . CHOLECYSTECTOMY   . LEG SURGERY    Per sister patient shattered leg requiring surgery with placement of hardware . STENT PLACEMENT ILIAC ([facility] HX)    HPI: Pt is a 72 y.o. right-handed female with medical history significant for HTN, HLD, CAD, CVA with residual left-sided weakness, dysphagia on pured diet, dementia, remote history of CAD, PVD with bilateral iliac stents in 2017, who presents from [facility] with complaints of new right-sided weakness, decreased speech output. MRI shows acute R pontine infarct. Prior BSE 01/22/18 recommended Dys 3 diet and thin liquids due to altered mentation with recommendation to advance back to regular textures as mentation returned to baseline.  Subjective: pt alert, cooperative, minimally conversant but following commands Assessment / Plan / Recommendation CHL IP CLINICAL IMPRESSIONS 05/02/2018 Clinical Impression Pt has a moderate oral dysphagia characterized by weak labial seal, lingual manipulation, and posterior propulsion. She has limited ability to masticate soft solids, holding boluses anteriorly  with small munching pattern without fully forming a cohesive bolus. Minimal amounts of soft solids were able to reach the pharynx to be swallowed, with the rest of the residue manually removed by SLP despite Max cues for lingual sweep and pureed/liquid washes. She has baseline left-sided weakness as well as new right-sided weakness with residue pocketed bilaterally. Although still prolonged, her oral phase is more effective with purees and thin liquids, and when she swallows she has good airway protection (intermittent flash penetration only with thin liquids) and pharyngeal efficiency. Recommend starting Dys 1 diet and thin liquids with careful monitoring for oral residue. SLP Visit Diagnosis Dysphagia, oral phase (R13.11) Attention and concentration deficit following -- Frontal lobe and executive function deficit following -- Impact on safety and function Mild aspiration risk;Moderate aspiration risk   CHL IP TREATMENT RECOMMENDATION 05/02/2018 Treatment Recommendations Therapy as outlined in treatment plan below   Prognosis  05/02/2018 Prognosis for Safe Diet Advancement (No Data) Barriers to Reach Goals Cognitive deficits Barriers/Prognosis Comment -- CHL IP DIET RECOMMENDATION 05/02/2018 SLP Diet Recommendations Dysphagia 1 (Puree) solids;Thin liquid Liquid Administration via Straw Medication Administration Crushed with puree Compensations Minimize environmental distractions;Slow rate;Small sips/bites;Lingual sweep for clearance of pocketing;Monitor for anterior loss Postural Changes Seated upright at 90 degrees   CHL IP OTHER RECOMMENDATIONS 05/02/2018 Recommended Consults -- Oral Care Recommendations Oral care BID Other Recommendations Have oral suction available   CHL IP FOLLOW UP RECOMMENDATIONS 05/02/2018 Follow up Recommendations Skilled Nursing facility   [facility] IP FREQUENCY AND DURATION 05/02/2018 Speech Therapy Frequency (ACUTE ONLY) min 2x/week Treatment Duration 2 weeks      CHL IP ORAL PHASE 05/02/2018 Oral Phase Impaired Oral - Pudding Teaspoon -- Oral - Pudding Cup -- Oral - Honey Teaspoon -- Oral - Honey Cup -- Oral - Nectar Teaspoon -- Oral - Nectar Cup -- Oral - Nectar Straw -- Oral - Thin Teaspoon -- Oral - Thin Cup -- Oral - Thin Straw Weak lingual manipulation;Delayed oral transit Oral - Puree Weak lingual manipulation;Delayed oral transit;Right pocketing in lateral sulci;Left pocketing in lateral sulci;Lingual/palatal residue Oral - Mech Soft Weak lingual manipulation;Delayed oral transit;Right pocketing in lateral sulci;Left pocketing in lateral sulci;Lingual/palatal residue;Impaired mastication;Pocketing in anterior sulcus Oral - Regular -- Oral - Multi-Consistency -- Oral - Pill -- Oral Phase - Comment --  CHL IP PHARYNGEAL PHASE 05/02/2018 Pharyngeal Phase WFL Pharyngeal- Pudding Teaspoon -- Pharyngeal -- Pharyngeal- Pudding Cup -- Pharyngeal -- Pharyngeal- Honey Teaspoon -- Pharyngeal -- Pharyngeal- Honey Cup -- Pharyngeal -- Pharyngeal- Nectar Teaspoon -- Pharyngeal -- Pharyngeal- Nectar Cup -- Pharyngeal --  Pharyngeal- Nectar Straw -- Pharyngeal -- Pharyngeal- Thin Teaspoon -- Pharyngeal -- Pharyngeal- Thin Cup -- Pharyngeal -- Pharyngeal- Thin Straw -- Pharyngeal -- Pharyngeal- Puree -- Pharyngeal -- Pharyngeal- Mechanical Soft -- Pharyngeal -- Pharyngeal- Regular -- Pharyngeal -- Pharyngeal- Multi-consistency -- Pharyngeal -- Pharyngeal- Pill -- Pharyngeal -- Pharyngeal Comment --  CHL IP CERVICAL ESOPHAGEAL PHASE 05/02/2018 Cervical Esophageal Phase WFL Pudding Teaspoon -- Pudding Cup -- Honey Teaspoon -- Honey Cup -- Nectar Teaspoon -- Nectar Cup -- Nectar Straw -- Thin Teaspoon -- Thin Cup -- Thin Straw -- Puree -- Mechanical Soft -- Regular -- Multi-consistency -- Pill -- Cervical Esophageal Comment -- No flowsheet data found. Maxcine Ham 05/02/2018, 4:01 PM  Maxcine Ham, M.A. CCC-SLP [phone]             Mr Shirlee Latch Wo Contrast  Result Date: 05/02/2018 CLINICAL DATA:  Initial evaluation for acute right-sided weakness. EXAM: MRI HEAD WITHOUT CONTRAST MRA  HEAD WITHOUT CONTRAST TECHNIQUE: Multiplanar, multiecho pulse sequences of the brain and surrounding structures were obtained without intravenous contrast. Angiographic images of the head were obtained using MRA technique without contrast. COMPARISON:  Prior CT from 05/01/2018 as well as previous MRI from 12/30/2017. FINDINGS: MRI HEAD FINDINGS Brain: Advanced cerebral atrophy for age. Extensive patchy and confluent T2/FLAIR hyperintensity throughout the periventricular and deep white matter both cerebral hemispheres, consistent with chronic microvascular ischemic disease, fairly advanced in nature. Chronic microvascular ischemic changes extend into the brainstem and middle cerebellar peduncles as well. Multiple superimposed remote lacunar infarcts present within the hemispheric cerebral white matter, deep gray nuclei, and brainstem as well as the right middle cerebellar peduncle. Innumerable chronic micro hemorrhages again seen, primarily  clustered around the deep gray nuclei and cerebellum, favored to reflect sequelae of poorly controlled hypertension. 6 mm acute ischemic infarct present at the right ventral pons (series 5, image 53). Probable additional faint subacute 6 mm ischemia infarct within the right periatrial white matter (series 5, image 60). No associated hemorrhage or mass effect. No other evidence for acute or subacute ischemia. No mass lesion, midline shift or mass effect. Diffuse ventricular prominence related to global parenchymal volume loss of hydrocephalus. No extra-axial fluid collection. Pituitary gland within normal limits. Vascular: Abnormal flow void within the left V4 segment, likely occluded. The major intracranial vascular flow voids otherwise maintained. Skull and upper cervical spine: Craniocervical junction within normal limits. Upper cervical spine demonstrates no significant finding. Bone marrow signal intensity within normal limits. No scalp soft tissue abnormality. Sinuses/Orbits: Globes and orbital soft tissues within normal limits. Patient status post lens extraction on the left. Paranasal sinuses are clear. Small volume opacity within the mastoid air cells bilaterally, of doubtful significance. Inner ear structures normal. Other: None. MRA HEAD FINDINGS ANTERIOR CIRCULATION: Distal examination degraded by motion artifact. Distal cervical segments of the internal carotid arteries are patent with antegrade flow. Petrous segments patent bilaterally. Scattered atheromatous irregularity within the cavernous/supraclinoid ICAs without high-grade stenosis, left worse than right. A1 segments patent bilaterally anterior communicating artery grossly normal. Anterior cerebral arteries irregular but patent to their distal aspects without definite stenosis. M1 segments mildly irregular but patent without high-grade stenosis. Right MCA bifurcates early. Severe proximal left M2 stenoses noted (series 1045, image 8). Advanced  small vessel atheromatous irregularity throughout the MCA branches distally. POSTERIOR CIRCULATION: Visualized right vertebral artery patent to the vertebrobasilar junction without stenosis. Left V4 segment not visualized, occluded. Slight retrograde filling into the distal left V4 segment. Right PICA patent proximally. Left PICA not visualized. Basilar irregular but patent to its distal aspect without flow-limiting stenosis. Superior cerebral arteries patent proximally. Probable severe left SCA stenosis noted (series 1037, image 9). Both of the PCAs arise from the basilar artery. Moderate right P1 stenosis (series 1037, image 9). Additional moderate distal right P2 stenosis (series 1037, image 16). Additional atheromatous irregularity throughout the remaining PCAs bilaterally, which do remain patent to their distal aspects. No aneurysm. IMPRESSION: MRI HEAD IMPRESSION: 1. 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 2. 6 mm subacute small vessel white matter infarct involving the right periatrial white matter. 3. Atrophy with severe chronic microvascular ischemic disease. 4. Innumerable chronic micro hemorrhages throughout the brain, favored to be secondary to poorly controlled hypertension. MRA HEAD IMPRESSION: 1. Occlusion of the left vertebral artery. 2. Advanced atheromatous change throughout the intracranial circulation. Notable findings include severe proximal left M2 stenoses, severe left SCA stenosis, with moderate right P1 and P2 stenoses.  Electronically Signed   By: Rise Mu M.D.   On: 05/02/2018 03:18    Assessment/Plan  1. Pressure injury of left heel, unstageable (HCC) - ABI result on right ankle brachial index is 100/160 equals 0.63, indicates moderate perfusion ischemia, will refer for vascular consult   Family/ staff Communication: Discussed plan of care with resident and charge nurse.  Labs/tests ordered:  None  Goals of care:   Long-term  care.   Kenard Gower, NP [facility] [phone] (Monday-Friday 8:00 a.m. - 5:00 p.m.) [phone] (after hours)

## 2018-05-16 DIAGNOSIS — I739 Peripheral vascular disease, unspecified: Secondary | ICD-10-CM | POA: Diagnosis not present

## 2018-05-16 DIAGNOSIS — I69920 Aphasia following unspecified cerebrovascular disease: Secondary | ICD-10-CM | POA: Diagnosis not present

## 2018-05-16 DIAGNOSIS — R1310 Dysphagia, unspecified: Secondary | ICD-10-CM | POA: Diagnosis not present

## 2018-05-16 DIAGNOSIS — I63311 Cerebral infarction due to thrombosis of right middle cerebral artery: Secondary | ICD-10-CM | POA: Diagnosis not present

## 2018-05-16 DIAGNOSIS — I679 Cerebrovascular disease, unspecified: Secondary | ICD-10-CM | POA: Diagnosis not present

## 2018-05-16 DIAGNOSIS — L89009 Pressure ulcer of unspecified elbow, unspecified stage: Secondary | ICD-10-CM | POA: Diagnosis not present

## 2018-05-17 DIAGNOSIS — R630 Anorexia: Secondary | ICD-10-CM | POA: Diagnosis not present

## 2018-05-17 DIAGNOSIS — F323 Major depressive disorder, single episode, severe with psychotic features: Secondary | ICD-10-CM | POA: Diagnosis not present

## 2018-05-17 DIAGNOSIS — F0390 Unspecified dementia without behavioral disturbance: Secondary | ICD-10-CM | POA: Diagnosis not present

## 2018-05-18 DIAGNOSIS — D649 Anemia, unspecified: Secondary | ICD-10-CM | POA: Diagnosis not present

## 2018-05-18 DIAGNOSIS — I69398 Other sequelae of cerebral infarction: Secondary | ICD-10-CM | POA: Diagnosis not present

## 2018-05-19 DIAGNOSIS — L89322 Pressure ulcer of left buttock, stage 2: Secondary | ICD-10-CM | POA: Diagnosis not present

## 2018-05-19 DIAGNOSIS — L89611 Pressure ulcer of right heel, stage 1: Secondary | ICD-10-CM | POA: Diagnosis not present

## 2018-05-19 DIAGNOSIS — L89621 Pressure ulcer of left heel, stage 1: Secondary | ICD-10-CM | POA: Diagnosis not present

## 2018-05-19 DIAGNOSIS — L89312 Pressure ulcer of right buttock, stage 2: Secondary | ICD-10-CM | POA: Diagnosis not present

## 2018-05-20 DIAGNOSIS — I63311 Cerebral infarction due to thrombosis of right middle cerebral artery: Secondary | ICD-10-CM | POA: Diagnosis not present

## 2018-05-20 DIAGNOSIS — L89009 Pressure ulcer of unspecified elbow, unspecified stage: Secondary | ICD-10-CM | POA: Diagnosis not present

## 2018-05-20 DIAGNOSIS — R1310 Dysphagia, unspecified: Secondary | ICD-10-CM | POA: Diagnosis not present

## 2018-05-20 DIAGNOSIS — I679 Cerebrovascular disease, unspecified: Secondary | ICD-10-CM | POA: Diagnosis not present

## 2018-05-20 DIAGNOSIS — I739 Peripheral vascular disease, unspecified: Secondary | ICD-10-CM | POA: Diagnosis not present

## 2018-05-20 DIAGNOSIS — I69920 Aphasia following unspecified cerebrovascular disease: Secondary | ICD-10-CM | POA: Diagnosis not present

## 2018-05-23 DIAGNOSIS — L89009 Pressure ulcer of unspecified elbow, unspecified stage: Secondary | ICD-10-CM | POA: Diagnosis not present

## 2018-05-23 DIAGNOSIS — I63311 Cerebral infarction due to thrombosis of right middle cerebral artery: Secondary | ICD-10-CM | POA: Diagnosis not present

## 2018-05-23 DIAGNOSIS — G619 Inflammatory polyneuropathy, unspecified: Secondary | ICD-10-CM | POA: Diagnosis not present

## 2018-05-23 DIAGNOSIS — I10 Essential (primary) hypertension: Secondary | ICD-10-CM | POA: Diagnosis not present

## 2018-05-23 DIAGNOSIS — R634 Abnormal weight loss: Secondary | ICD-10-CM | POA: Diagnosis not present

## 2018-05-23 DIAGNOSIS — I679 Cerebrovascular disease, unspecified: Secondary | ICD-10-CM | POA: Diagnosis not present

## 2018-05-23 DIAGNOSIS — L89620 Pressure ulcer of left heel, unstageable: Secondary | ICD-10-CM | POA: Diagnosis not present

## 2018-05-23 DIAGNOSIS — I69920 Aphasia following unspecified cerebrovascular disease: Secondary | ICD-10-CM | POA: Diagnosis not present

## 2018-05-23 DIAGNOSIS — E785 Hyperlipidemia, unspecified: Secondary | ICD-10-CM | POA: Diagnosis not present

## 2018-05-23 DIAGNOSIS — I25119 Atherosclerotic heart disease of native coronary artery with unspecified angina pectoris: Secondary | ICD-10-CM | POA: Diagnosis not present

## 2018-05-23 DIAGNOSIS — F339 Major depressive disorder, recurrent, unspecified: Secondary | ICD-10-CM | POA: Diagnosis not present

## 2018-05-23 DIAGNOSIS — I739 Peripheral vascular disease, unspecified: Secondary | ICD-10-CM | POA: Diagnosis not present

## 2018-05-23 DIAGNOSIS — R1310 Dysphagia, unspecified: Secondary | ICD-10-CM | POA: Diagnosis not present

## 2018-05-23 DIAGNOSIS — F0281 Dementia in other diseases classified elsewhere with behavioral disturbance: Secondary | ICD-10-CM | POA: Diagnosis not present

## 2018-05-24 ENCOUNTER — Non-Acute Institutional Stay (SKILLED_NURSING_FACILITY): Payer: Medicare Other

## 2018-05-24 ENCOUNTER — Non-Acute Institutional Stay (SKILLED_NURSING_FACILITY): Payer: Medicare Other | Admitting: Internal Medicine

## 2018-05-24 ENCOUNTER — Encounter: Payer: Self-pay | Admitting: Internal Medicine

## 2018-05-24 DIAGNOSIS — F0390 Unspecified dementia without behavioral disturbance: Secondary | ICD-10-CM | POA: Diagnosis not present

## 2018-05-24 DIAGNOSIS — I63311 Cerebral infarction due to thrombosis of right middle cerebral artery: Secondary | ICD-10-CM | POA: Diagnosis not present

## 2018-05-24 DIAGNOSIS — L89009 Pressure ulcer of unspecified elbow, unspecified stage: Secondary | ICD-10-CM | POA: Diagnosis not present

## 2018-05-24 DIAGNOSIS — F01518 Vascular dementia, unspecified severity, with other behavioral disturbance: Secondary | ICD-10-CM

## 2018-05-24 DIAGNOSIS — G4700 Insomnia, unspecified: Secondary | ICD-10-CM | POA: Diagnosis not present

## 2018-05-24 DIAGNOSIS — I739 Peripheral vascular disease, unspecified: Secondary | ICD-10-CM | POA: Diagnosis not present

## 2018-05-24 DIAGNOSIS — F0151 Vascular dementia with behavioral disturbance: Secondary | ICD-10-CM | POA: Diagnosis not present

## 2018-05-24 DIAGNOSIS — I679 Cerebrovascular disease, unspecified: Secondary | ICD-10-CM | POA: Diagnosis not present

## 2018-05-24 DIAGNOSIS — F323 Major depressive disorder, single episode, severe with psychotic features: Secondary | ICD-10-CM | POA: Diagnosis not present

## 2018-05-24 DIAGNOSIS — I69920 Aphasia following unspecified cerebrovascular disease: Secondary | ICD-10-CM | POA: Diagnosis not present

## 2018-05-24 DIAGNOSIS — R1310 Dysphagia, unspecified: Secondary | ICD-10-CM | POA: Diagnosis not present

## 2018-05-24 DIAGNOSIS — I10 Essential (primary) hypertension: Secondary | ICD-10-CM | POA: Diagnosis not present

## 2018-05-24 DIAGNOSIS — Z8673 Personal history of transient ischemic attack (TIA), and cerebral infarction without residual deficits: Secondary | ICD-10-CM

## 2018-05-24 DIAGNOSIS — Z66 Do not resuscitate: Secondary | ICD-10-CM

## 2018-05-24 DIAGNOSIS — Z0000 Encounter for general adult medical examination without abnormal findings: Secondary | ICD-10-CM | POA: Diagnosis not present

## 2018-05-24 NOTE — Patient Instructions (Addendum)
[name] , Thank you for taking time to come for your Medicare Wellness Visit. I appreciate your ongoing commitment to your health goals. Please review the following plan we discussed and let me know if I can assist you in the future.   Screening recommendations/referrals: Colonoscopy due, ordered Mammogram due, ordered Bone Density due, ordered Recommended yearly ophthalmology/optometry visit for glaucoma screening and checkup Recommended yearly dental visit for hygiene and checkup  Vaccinations: Influenza vaccine up to date, due 2019 fall season Pneumococcal vaccine 13 due, ordered Tdap vaccine due, ordered Shingles vaccine not in past records    Advanced directives: in chart  Conditions/risks identified: none  Next appointment: Dr. Alwyn RenHopper makes rounds   Preventive Care 65 Years and Older, Female Preventive care refers to lifestyle choices and visits with your health care provider that can promote health and wellness. What does preventive care include?  A yearly physical exam. This is also called an annual well check.  Dental exams once or twice a year.  Routine eye exams. Ask your health care provider how often you should have your eyes checked.  Personal lifestyle choices, including:  Daily care of your teeth and gums.  Regular physical activity.  Eating a healthy diet.  Avoiding tobacco and drug use.  Limiting alcohol use.  Practicing safe sex.  Taking low-dose aspirin every day.  Taking vitamin and mineral supplements as recommended by your health care provider. What happens during an annual well check? The services and screenings done by your health care provider during your annual well check will depend on your age, overall health, lifestyle risk factors, and family history of disease. Counseling  Your health care provider may ask you questions about your:  Alcohol use.  Tobacco use.  Drug use.  Emotional well-being.  Home and relationship  well-being.  Sexual activity.  Eating habits.  History of falls.  Memory and ability to understand (cognition).  Work and work Astronomerenvironment.  Reproductive health. Screening  You may have the following tests or measurements:  Height, weight, and BMI.  Blood pressure.  Lipid and cholesterol levels. These may be checked every 5 years, or more frequently if you are over 72 years old.  Skin check.  Lung cancer screening. You may have this screening every year starting at age 72 if you have a 30-pack-year history of smoking and currently smoke or have quit within the past 15 years.  Fecal occult blood test (FOBT) of the stool. You may have this test every year starting at age 72.  Flexible sigmoidoscopy or colonoscopy. You may have a sigmoidoscopy every 5 years or a colonoscopy every 10 years starting at age 72.  Hepatitis C blood test.  Hepatitis B blood test.  Sexually transmitted disease (STD) testing.  Diabetes screening. This is done by checking your blood sugar (glucose) after you have not eaten for a while (fasting). You may have this done every 1-3 years.  Bone density scan. This is done to screen for osteoporosis. You may have this done starting at age 72.  Mammogram. This may be done every 1-2 years. Talk to your health care provider about how often you should have regular mammograms. Talk with your health care provider about your test results, treatment options, and if necessary, the need for more tests. Vaccines  Your health care provider may recommend certain vaccines, such as:  Influenza vaccine. This is recommended every year.  Tetanus, diphtheria, and acellular pertussis (Tdap, Td) vaccine. You may need a Td booster every  10 years.  Zoster vaccine. You may need this after age 76.  Pneumococcal 13-valent conjugate (PCV13) vaccine. One dose is recommended after age 82.  Pneumococcal polysaccharide (PPSV23) vaccine. One dose is recommended after age  87. Talk to your health care provider about which screenings and vaccines you need and how often you need them. This information is not intended to replace advice given to you by your health care provider. Make sure you discuss any questions you have with your health care provider. Document Released: 12/06/2015 Document Revised: 07/29/2016 Document Reviewed: 09/10/2015 Elsevier Interactive Patient Education  2017 [place] Prevention in the Home Falls can cause injuries. They can happen to people of all ages. There are many things you can do to make your home safe and to help prevent falls. What can I do on the outside of my home?  Regularly fix the edges of walkways and driveways and fix any cracks.  Remove anything that might make you trip as you walk through a door, such as a raised step or threshold.  Trim any bushes or trees on the path to your home.  Use bright outdoor lighting.  Clear any walking paths of anything that might make someone trip, such as rocks or tools.  Regularly check to see if handrails are loose or broken. Make sure that both sides of any steps have handrails.  Any raised decks and porches should have guardrails on the edges.  Have any leaves, snow, or ice cleared regularly.  Use sand or salt on walking paths during winter.  Clean up any spills in your garage right away. This includes oil or grease spills. What can I do in the bathroom?  Use night lights.  Install grab bars by the toilet and in the tub and shower. Do not use towel bars as grab bars.  Use non-skid mats or decals in the tub or shower.  If you need to sit down in the shower, use a plastic, non-slip stool.  Keep the floor dry. Clean up any water that spills on the floor as soon as it happens.  Remove soap buildup in the tub or shower regularly.  Attach bath mats securely with double-sided non-slip rug tape.  Do not have throw rugs and other things on the floor that can make  you trip. What can I do in the bedroom?  Use night lights.  Make sure that you have a light by your bed that is easy to reach.  Do not use any sheets or blankets that are too big for your bed. They should not hang down onto the floor.  Have a firm chair that has side arms. You can use this for support while you get dressed.  Do not have throw rugs and other things on the floor that can make you trip. What can I do in the kitchen?  Clean up any spills right away.  Avoid walking on wet floors.  Keep items that you use a lot in easy-to-reach places.  If you need to reach something above you, use a strong step stool that has a grab bar.  Keep electrical cords out of the way.  Do not use floor polish or wax that makes floors slippery. If you must use wax, use non-skid floor wax.  Do not have throw rugs and other things on the floor that can make you trip. What can I do with my stairs?  Do not leave any items on the stairs.  Make sure  that there are handrails on both sides of the stairs and use them. Fix handrails that are broken or loose. Make sure that handrails are as long as the stairways.  Check any carpeting to make sure that it is firmly attached to the stairs. Fix any carpet that is loose or worn.  Avoid having throw rugs at the top or bottom of the stairs. If you do have throw rugs, attach them to the floor with carpet tape.  Make sure that you have a light switch at the top of the stairs and the bottom of the stairs. If you do not have them, ask someone to add them for you. What else can I do to help prevent falls?  Wear shoes that:  Do not have high heels.  Have rubber bottoms.  Are comfortable and fit you well.  Are closed at the toe. Do not wear sandals.  If you use a stepladder:  Make sure that it is fully opened. Do not climb a closed stepladder.  Make sure that both sides of the stepladder are locked into place.  Ask someone to hold it for you, if  possible.  Clearly mark and make sure that you can see:  Any grab bars or handrails.  First and last steps.  Where the edge of each step is.  Use tools that help you move around (mobility aids) if they are needed. These include:  Canes.  Walkers.  Scooters.  Crutches.  Turn on the lights when you go into a dark area. Replace any light bulbs as soon as they burn out.  Set up your furniture so you have a clear path. Avoid moving your furniture around.  If any of your floors are uneven, fix them.  If there are any pets around you, be aware of where they are.  Review your medicines with your doctor. Some medicines can make you feel dizzy. This can increase your chance of falling. Ask your doctor what other things that you can do to help prevent falls. This information is not intended to replace advice given to you by your health care provider. Make sure you discuss any questions you have with your health care provider. Document Released: 09/05/2009 Document Revised: 04/16/2016 Document Reviewed: 12/14/2014 Elsevier Interactive Patient Education  2017 [employer].

## 2018-05-24 NOTE — Assessment & Plan Note (Signed)
D/C Aricept as etiology appears to be severe vascular dementia

## 2018-05-24 NOTE — Assessment & Plan Note (Addendum)
Discontinue Aricept as etiology is vascular dementia (see CT scan ), not Alzheimer's

## 2018-05-24 NOTE — Progress Notes (Addendum)
Subjective:   Morgan Sutton is a 72 y.o. female who presents for an Initial Medicare Annual Wellness Visit at [facility]; incapacitated patient unable to answer questions appropriately        Objective:    Today's Vitals   05/24/18 1016  BP: 115/70  Pulse: 75  Temp: 97.9 F (36.6 C)  TempSrc: Oral  SpO2: 93%  Weight: 151 lb (68.5 kg)  Height: 5\' 6"  (1.676 m)   Body mass index is 24.37 kg/m.  Advanced Directives 05/24/2018 05/12/2018 05/09/2018 05/01/2018 03/28/2018 03/01/2018 02/22/2018  Does Patient Have a Medical Advance Directive? Yes Yes Yes Yes Yes Yes Yes  Type of Advance Directive Out of facility DNR (pink MOST or yellow form) Out of facility DNR (pink MOST or yellow form) Out of facility DNR (pink MOST or yellow form) Healthcare Power of Attorney Out of facility DNR (pink MOST or yellow form) Out of facility DNR (pink MOST or yellow form) Out of facility DNR (pink MOST or yellow form)  Does patient want to make changes to medical advance directive? No - Patient declined No - Patient declined No - Patient declined - No - Patient declined No - Patient declined No - Patient declined  Would patient like information on creating a medical advance directive? - - - - - - -  Pre-existing out of facility DNR order (yellow form or pink MOST form) Yellow form placed in chart (order not valid for inpatient use) - - - - - -    Current Medications (verified) Outpatient Encounter Medications as of 05/24/2018  Medication Sig  . acetaminophen (TYLENOL) 325 MG tablet Take 650 mg by mouth every 6 (six) hours as needed for mild pain, fever or headache.   . Amino Acids-Protein Hydrolys (FEEDING SUPPLEMENT, PRO-STAT SUGAR FREE 64,) LIQD Take 30 mLs by mouth daily.  Marland Kitchen. amLODipine (NORVASC) 10 MG tablet Take 10 mg by mouth daily.  Marland Kitchen. aspirin EC 81 MG tablet Take 1 tablet (81 mg total) by mouth daily. With Food  . atorvastatin (LIPITOR) 40 MG tablet Take 1 tablet (40 mg total) by mouth every  evening.  . cholecalciferol (VITAMIN D) 1000 units tablet Take 2,000 Units by mouth daily.   . clopidogrel (PLAVIX) 75 MG tablet Take 75 mg by mouth daily.  Marland Kitchen. donepezil (ARICEPT) 10 MG tablet Take 10 mg by mouth at bedtime.  . hydrALAZINE (APRESOLINE) 50 MG tablet Take 1 tablet (50 mg total) by mouth every 8 (eight) hours.  . mirtazapine (REMERON) 15 MG tablet Take 15 mg by mouth at bedtime.  . Multiple Vitamins-Minerals (MULTIVITAMIN WITH MINERALS) tablet Take 1 tablet by mouth daily.  . Nutritional Supplements (NUTRITIONAL SUPPLEMENT PO) Take 1 each by mouth 2 (two) times daily. Magic Cup  . ziprasidone (GEODON) 20 MG capsule Take 20 mg by mouth daily.   No facility-administered encounter medications on file as of 05/24/2018.     Allergies (verified) Seroquel [quetiapine fumarate]   History: Past Medical History:  Diagnosis Date  . Cerebral infarction Feb 2019   . Dementia   . HLD (hyperlipidemia)   . Hypertension   . Peripheral vascular disease (HCC)   . Stroke [facility](HCC)    Past Surgical History:  Procedure Laterality Date  . CHOLECYSTECTOMY    . LEG SURGERY     Per sister patient shattered leg requiring surgery with placement of hardware  . STENT PLACEMENT ILIAC ([facility] HX)    . TEE WITHOUT CARDIOVERSION N/A 05/05/2018   Procedure:  TRANSESOPHAGEAL ECHOCARDIOGRAM (TEE);  Surgeon: Thurmon Fair, MD;  Location: [facility] ENDOSCOPY;  Service: Cardiovascular;  Laterality: N/A;   Family History  Problem Relation Age of Onset  . Heart disease Father   . Dementia Mother    Social History   Socioeconomic History  . Marital status: Widowed    Spouse name: Not on file  . Number of children: Not on file  . Years of education: Not on file  . Highest education level: Not on file  Occupational History  . Not on file  Social Needs  . Financial resource strain: Not on file  . Food insecurity:    Worry: Not on file    Inability: Not on file  . Transportation needs:    Medical: Not on file     Non-medical: Not on file  Tobacco Use  . Smoking status: Former Games developer  . Smokeless tobacco: Never Used  Substance and Sexual Activity  . Alcohol use: No    Frequency: Never  . Drug use: No  . Sexual activity: Not on file  Lifestyle  . Physical activity:    Days per week: 0 days    Minutes per session: 0 min  . Stress: Only a little  Relationships  . Social connections:    Talks on phone: Once a week    Gets together: Once a week    Attends religious service: Never    Active member of club or organization: No    Attends meetings of clubs or organizations: Never    Relationship status: Widowed  Other Topics Concern  . Not on file  Social History Narrative   Widowed.      Tobacco Counseling Counseling given: Not Answered   Clinical Intake:  Pre-visit preparation completed: No  Pain : No/denies pain     Nutritional Risks: None Diabetes: No     Interpreter Needed?: No  Information entered by :: Morgan Russell, RN   Activities of Daily Living In your present state of health, do you have any difficulty performing the following activities: 05/24/2018 01/23/2018  Hearing? N N  Vision? N N  Difficulty concentrating or making decisions? Morgan Sutton  Walking or climbing stairs? Y Y  Dressing or bathing? Y Y  Doing errands, shopping? Morgan Sutton  Preparing Food and eating ? Y -  Using the Toilet? Y -  In the past six months, have you accidently leaked urine? Y -  Do you have problems with loss of bowel control? Y -  Managing your Medications? Y -  Managing your Finances? Y -  Housekeeping or managing your Housekeeping? Y -  Some recent data might be hidden     Immunizations and Health Maintenance Immunization History  Administered Date(s) Administered  . Influenza-Unspecified 08/23/2017   Health Maintenance Due  Topic Date Due  . MAMMOGRAM  05/02/1996  . DEXA SCAN  05/03/2011  . PNA vac Low Risk Adult (1 of 2 - PCV13) 05/03/2011  . COLONOSCOPY  09/07/2017     Patient Care Team: Pecola Lawless, MD as PCP - General (Internal Medicine) Medina-Vargas, Margit Banda, NP as Nurse Practitioner (Internal Medicine)  Indicate any recent Medical Services you may have received from other than [facility] providers in the past year (date may be approximate).     Assessment:   This is a routine wellness examination for Morgan Sutton.  Hearing/Vision screen No exam data present  Dietary issues and exercise activities discussed: Current Exercise Habits: The patient does not participate in regular exercise  at present, Exercise limited by: neurologic condition(s);orthopedic condition(s)  Goals    None     Depression Screen PHQ 2/9 Scores 05/24/2018  PHQ - 2 Score 0    Fall Risk Fall Risk  05/24/2018  Falls in the past year? No    Is the patient's home free of loose throw rugs in walkways, pet beds, electrical cords, etc?   yes      Grab bars in the bathroom? yes      Handrails on the stairs?   yes      Adequate lighting?   yes   Cognitive Function: MMSE - Mini Mental State Exam 05/24/2018  Not completed: Unable to complete        Screening Tests Health Maintenance  Topic Date Due  . MAMMOGRAM  05/02/1996  . DEXA SCAN  05/03/2011  . PNA vac Low Risk Adult (1 of 2 - PCV13) 05/03/2011  . COLONOSCOPY  09/07/2017  . Hepatitis C Screening  02/23/2019 (Originally 06/07/1946)  . TETANUS/TDAP  05/13/2019 (Originally 05/02/1965)  . INFLUENZA VACCINE  06/23/2018    Qualifies for Shingles Vaccine? Not in past records  Cancer Screenings: Lung: Low Dose CT Chest recommended if Age 57-80 years, 30 pack-year currently smoking OR have quit w/in 15years. Patient does not qualify. Breast: Up to date on Mammogram? No, ordered Up to date of Bone Density/Dexa? No, ordered Colorectal: due, ordered  Additional Screenings:  Hepatitis C Screening: declined TDAP due: ordered     Plan:    I have personally reviewed and addressed the Medicare Annual Wellness  questionnaire and have noted the following in the patient's chart:  A. Medical and social history B. Use of alcohol, tobacco or illicit drugs  C. Current medications and supplements D. Functional ability and status E.  Nutritional status F.  Physical activity G. Advance directives H. List of other physicians I.  Hospitalizations, surgeries, and ER visits in previous 12 months J.  Vitals K. Screenings to include hearing, vision, cognitive, depression L. Referrals and appointments - none  In addition, I am unable to review and discuss with incapacitated patient certain preventive protocols, quality metrics, and best practice recommendations. A written personalized care plan for preventive services as well as general preventive health recommendations were provided to patient.   See attached scanned questionnaire for additional information.   Signed,   Morgan Russell, RN Nurse Health Advisor  Patient Concerns: None I have personally reviewed the health advisor's clinical note, was available for consultation, and agree with the assessment. Preventive interventions such as DEXA & colonoscopy not indicated due to advanced vascular dementia with non verbal state Pecola Lawless M.D., FACP, [facility]

## 2018-05-24 NOTE — Progress Notes (Signed)
    NURSING HOME LOCATION:  [facility] ROOM NUMBER:  [id number]  CODE STATUS:  DNR  PCP:  Pecola LawlessHopper, William F, MD  [address] [place] [id number]  This is a nursing facility follow up of chronic medical diagnoses.    Interim medical record and care since last [facility] visit was updated with review of diagnostic studies and change in clinical status since last visit were documented.  HPI: The patient is now a permanent resident of the facility having been admitted after hospitalized 3/1-01/25/18 with progressive weakness and somnolence. The patient exhibited anorexia with poor intake. In the ED she was found to have a sodium of 158; diuretics were discontinued and she was slowly rehydrated. CT 01/21/18 revealed diffuse atrophy and chronic small vessel white matter ischemic changes with remote thalamic, basal ganglia, and cerebellar infarcts with no acute changes from  prior imaging.  She had been hospitalized in [facility] after an acute CVA superimposed on baseline dementia. Antihypertensive medications were changed to Norvasc and hydralazine. Dysphagia 3 diet was initiated. Labs performed 05/05/18 revealed a sodium of 143 and glucose of 112. Renal function was normal.  Review of systems: Dementia & non verbal state precluded completion.   Physical exam:  Pertinent or positive findings:Non verbal as noted. Exotropia OD.Upper plate.Decreased L nasolabial fold.Slow, slightly irregular rhythm.RUE limp.Decreased pedal pulses.L toe upgoing with exam of foot.Feet in protective booties.  General appearance: Adequately nourished; no acute distress, increased work of breathing is present.   Lymphatic: No lymphadenopathy about the head, neck, axilla. Eyes: No conjunctival inflammation or lid edema is present. There is no scleral icterus. Ears:  External ear exam shows no significant lesions or deformities.   Nose:  External nasal examination shows  no deformity or inflammation. Nasal mucosa are pink and moist without lesions, exudates Oral exam:  Lips and gums are healthy appearing. There is no oropharyngeal erythema or exudate. Neck:  No thyromegaly, masses, tenderness noted.    Heart:  without gallop, murmur, click, rub .  Lungs:  without wheezes, rhonchi, rales, rubs. Abdomen: Bowel sounds are normal. Abdomen is soft and nontender with no organomegaly, hernias, masses. GU: Deferred  Extremities:  No cyanosis, clubbing, edema  Skin: Warm & dry w/o tenting. No significant rash.  See summary under each active problem in the Problem List with associated updated therapeutic plan

## 2018-05-24 NOTE — Assessment & Plan Note (Signed)
BP controlled; no change in antihypertensive medications  

## 2018-05-25 DIAGNOSIS — R1310 Dysphagia, unspecified: Secondary | ICD-10-CM | POA: Diagnosis not present

## 2018-05-25 DIAGNOSIS — L89009 Pressure ulcer of unspecified elbow, unspecified stage: Secondary | ICD-10-CM | POA: Diagnosis not present

## 2018-05-25 DIAGNOSIS — I739 Peripheral vascular disease, unspecified: Secondary | ICD-10-CM | POA: Diagnosis not present

## 2018-05-25 DIAGNOSIS — I63311 Cerebral infarction due to thrombosis of right middle cerebral artery: Secondary | ICD-10-CM | POA: Diagnosis not present

## 2018-05-25 DIAGNOSIS — I69920 Aphasia following unspecified cerebrovascular disease: Secondary | ICD-10-CM | POA: Diagnosis not present

## 2018-05-25 DIAGNOSIS — I679 Cerebrovascular disease, unspecified: Secondary | ICD-10-CM | POA: Diagnosis not present

## 2018-05-26 NOTE — Assessment & Plan Note (Signed)
DEXA, colonoscopy, mammograms not clinically indicated due to end stage dementia

## 2018-05-26 NOTE — Patient Instructions (Signed)
See assessment and plan under each diagnosis in the problem list and acutely for this visit 

## 2018-05-27 DIAGNOSIS — I739 Peripheral vascular disease, unspecified: Secondary | ICD-10-CM | POA: Diagnosis not present

## 2018-05-27 DIAGNOSIS — I679 Cerebrovascular disease, unspecified: Secondary | ICD-10-CM | POA: Diagnosis not present

## 2018-05-27 DIAGNOSIS — R1310 Dysphagia, unspecified: Secondary | ICD-10-CM | POA: Diagnosis not present

## 2018-05-27 DIAGNOSIS — I69920 Aphasia following unspecified cerebrovascular disease: Secondary | ICD-10-CM | POA: Diagnosis not present

## 2018-05-27 DIAGNOSIS — L89009 Pressure ulcer of unspecified elbow, unspecified stage: Secondary | ICD-10-CM | POA: Diagnosis not present

## 2018-05-27 DIAGNOSIS — I63311 Cerebral infarction due to thrombosis of right middle cerebral artery: Secondary | ICD-10-CM | POA: Diagnosis not present

## 2018-05-30 DIAGNOSIS — R1310 Dysphagia, unspecified: Secondary | ICD-10-CM | POA: Diagnosis not present

## 2018-05-30 DIAGNOSIS — I63311 Cerebral infarction due to thrombosis of right middle cerebral artery: Secondary | ICD-10-CM | POA: Diagnosis not present

## 2018-05-30 DIAGNOSIS — I69920 Aphasia following unspecified cerebrovascular disease: Secondary | ICD-10-CM | POA: Diagnosis not present

## 2018-05-30 DIAGNOSIS — I739 Peripheral vascular disease, unspecified: Secondary | ICD-10-CM | POA: Diagnosis not present

## 2018-05-30 DIAGNOSIS — L89009 Pressure ulcer of unspecified elbow, unspecified stage: Secondary | ICD-10-CM | POA: Diagnosis not present

## 2018-05-30 DIAGNOSIS — L89620 Pressure ulcer of left heel, unstageable: Secondary | ICD-10-CM | POA: Diagnosis not present

## 2018-05-30 DIAGNOSIS — I679 Cerebrovascular disease, unspecified: Secondary | ICD-10-CM | POA: Diagnosis not present

## 2018-05-31 DIAGNOSIS — I69920 Aphasia following unspecified cerebrovascular disease: Secondary | ICD-10-CM | POA: Diagnosis not present

## 2018-05-31 DIAGNOSIS — I63311 Cerebral infarction due to thrombosis of right middle cerebral artery: Secondary | ICD-10-CM | POA: Diagnosis not present

## 2018-05-31 DIAGNOSIS — L89009 Pressure ulcer of unspecified elbow, unspecified stage: Secondary | ICD-10-CM | POA: Diagnosis not present

## 2018-05-31 DIAGNOSIS — I739 Peripheral vascular disease, unspecified: Secondary | ICD-10-CM | POA: Diagnosis not present

## 2018-05-31 DIAGNOSIS — F323 Major depressive disorder, single episode, severe with psychotic features: Secondary | ICD-10-CM | POA: Diagnosis not present

## 2018-05-31 DIAGNOSIS — R1310 Dysphagia, unspecified: Secondary | ICD-10-CM | POA: Diagnosis not present

## 2018-05-31 DIAGNOSIS — I679 Cerebrovascular disease, unspecified: Secondary | ICD-10-CM | POA: Diagnosis not present

## 2018-05-31 DIAGNOSIS — R630 Anorexia: Secondary | ICD-10-CM | POA: Diagnosis not present

## 2018-05-31 DIAGNOSIS — F0390 Unspecified dementia without behavioral disturbance: Secondary | ICD-10-CM | POA: Diagnosis not present

## 2018-06-01 DIAGNOSIS — I63311 Cerebral infarction due to thrombosis of right middle cerebral artery: Secondary | ICD-10-CM | POA: Diagnosis not present

## 2018-06-01 DIAGNOSIS — L89009 Pressure ulcer of unspecified elbow, unspecified stage: Secondary | ICD-10-CM | POA: Diagnosis not present

## 2018-06-01 DIAGNOSIS — I739 Peripheral vascular disease, unspecified: Secondary | ICD-10-CM | POA: Diagnosis not present

## 2018-06-01 DIAGNOSIS — I679 Cerebrovascular disease, unspecified: Secondary | ICD-10-CM | POA: Diagnosis not present

## 2018-06-01 DIAGNOSIS — I69920 Aphasia following unspecified cerebrovascular disease: Secondary | ICD-10-CM | POA: Diagnosis not present

## 2018-06-01 DIAGNOSIS — R1310 Dysphagia, unspecified: Secondary | ICD-10-CM | POA: Diagnosis not present

## 2018-06-02 DIAGNOSIS — L89009 Pressure ulcer of unspecified elbow, unspecified stage: Secondary | ICD-10-CM | POA: Diagnosis not present

## 2018-06-02 DIAGNOSIS — R1310 Dysphagia, unspecified: Secondary | ICD-10-CM | POA: Diagnosis not present

## 2018-06-02 DIAGNOSIS — I679 Cerebrovascular disease, unspecified: Secondary | ICD-10-CM | POA: Diagnosis not present

## 2018-06-02 DIAGNOSIS — I69920 Aphasia following unspecified cerebrovascular disease: Secondary | ICD-10-CM | POA: Diagnosis not present

## 2018-06-02 DIAGNOSIS — I739 Peripheral vascular disease, unspecified: Secondary | ICD-10-CM | POA: Diagnosis not present

## 2018-06-02 DIAGNOSIS — I63311 Cerebral infarction due to thrombosis of right middle cerebral artery: Secondary | ICD-10-CM | POA: Diagnosis not present

## 2018-06-06 DIAGNOSIS — I69920 Aphasia following unspecified cerebrovascular disease: Secondary | ICD-10-CM | POA: Diagnosis not present

## 2018-06-06 DIAGNOSIS — L89009 Pressure ulcer of unspecified elbow, unspecified stage: Secondary | ICD-10-CM | POA: Diagnosis not present

## 2018-06-06 DIAGNOSIS — I739 Peripheral vascular disease, unspecified: Secondary | ICD-10-CM | POA: Diagnosis not present

## 2018-06-06 DIAGNOSIS — R1310 Dysphagia, unspecified: Secondary | ICD-10-CM | POA: Diagnosis not present

## 2018-06-06 DIAGNOSIS — I679 Cerebrovascular disease, unspecified: Secondary | ICD-10-CM | POA: Diagnosis not present

## 2018-06-06 DIAGNOSIS — I63311 Cerebral infarction due to thrombosis of right middle cerebral artery: Secondary | ICD-10-CM | POA: Diagnosis not present

## 2018-06-06 DIAGNOSIS — L89620 Pressure ulcer of left heel, unstageable: Secondary | ICD-10-CM | POA: Diagnosis not present

## 2018-06-08 DIAGNOSIS — I69920 Aphasia following unspecified cerebrovascular disease: Secondary | ICD-10-CM | POA: Diagnosis not present

## 2018-06-08 DIAGNOSIS — I63311 Cerebral infarction due to thrombosis of right middle cerebral artery: Secondary | ICD-10-CM | POA: Diagnosis not present

## 2018-06-08 DIAGNOSIS — L89009 Pressure ulcer of unspecified elbow, unspecified stage: Secondary | ICD-10-CM | POA: Diagnosis not present

## 2018-06-08 DIAGNOSIS — I679 Cerebrovascular disease, unspecified: Secondary | ICD-10-CM | POA: Diagnosis not present

## 2018-06-08 DIAGNOSIS — I739 Peripheral vascular disease, unspecified: Secondary | ICD-10-CM | POA: Diagnosis not present

## 2018-06-08 DIAGNOSIS — R1310 Dysphagia, unspecified: Secondary | ICD-10-CM | POA: Diagnosis not present

## 2018-06-09 DIAGNOSIS — R319 Hematuria, unspecified: Secondary | ICD-10-CM | POA: Diagnosis not present

## 2018-06-09 DIAGNOSIS — G9341 Metabolic encephalopathy: Secondary | ICD-10-CM | POA: Diagnosis not present

## 2018-06-09 DIAGNOSIS — F0390 Unspecified dementia without behavioral disturbance: Secondary | ICD-10-CM | POA: Diagnosis not present

## 2018-06-09 DIAGNOSIS — Z79899 Other long term (current) drug therapy: Secondary | ICD-10-CM | POA: Diagnosis not present

## 2018-06-09 DIAGNOSIS — I10 Essential (primary) hypertension: Secondary | ICD-10-CM | POA: Diagnosis not present

## 2018-06-09 DIAGNOSIS — D649 Anemia, unspecified: Secondary | ICD-10-CM | POA: Diagnosis not present

## 2018-06-09 DIAGNOSIS — I739 Peripheral vascular disease, unspecified: Secondary | ICD-10-CM | POA: Diagnosis not present

## 2018-06-09 DIAGNOSIS — L89009 Pressure ulcer of unspecified elbow, unspecified stage: Secondary | ICD-10-CM | POA: Diagnosis not present

## 2018-06-09 DIAGNOSIS — I679 Cerebrovascular disease, unspecified: Secondary | ICD-10-CM | POA: Diagnosis not present

## 2018-06-09 DIAGNOSIS — R1310 Dysphagia, unspecified: Secondary | ICD-10-CM | POA: Diagnosis not present

## 2018-06-09 DIAGNOSIS — I69920 Aphasia following unspecified cerebrovascular disease: Secondary | ICD-10-CM | POA: Diagnosis not present

## 2018-06-09 DIAGNOSIS — I63311 Cerebral infarction due to thrombosis of right middle cerebral artery: Secondary | ICD-10-CM | POA: Diagnosis not present

## 2018-06-09 LAB — CBC AND DIFFERENTIAL
HEMATOCRIT: 53 — AB (ref 36–46)
Hemoglobin: 16.3 — AB (ref 12.0–16.0)
NEUTROS ABS: 8
PLATELETS: 168 (ref 150–399)
WBC: 11.7

## 2018-06-09 LAB — BASIC METABOLIC PANEL
BUN: 46 — AB (ref 4–21)
CREATININE: 1 (ref 0.5–1.1)
Glucose: 139
Potassium: 3.9 (ref 3.4–5.3)
Sodium: 174 — AB (ref 137–147)

## 2018-06-10 ENCOUNTER — Non-Acute Institutional Stay (SKILLED_NURSING_FACILITY): Payer: Medicare Other | Admitting: Adult Health

## 2018-06-10 ENCOUNTER — Encounter: Payer: Self-pay | Admitting: Adult Health

## 2018-06-10 DIAGNOSIS — D72829 Elevated white blood cell count, unspecified: Secondary | ICD-10-CM | POA: Diagnosis not present

## 2018-06-10 DIAGNOSIS — I679 Cerebrovascular disease, unspecified: Secondary | ICD-10-CM | POA: Diagnosis not present

## 2018-06-10 DIAGNOSIS — E870 Hyperosmolality and hypernatremia: Secondary | ICD-10-CM

## 2018-06-10 DIAGNOSIS — F333 Major depressive disorder, recurrent, severe with psychotic symptoms: Secondary | ICD-10-CM | POA: Diagnosis not present

## 2018-06-10 DIAGNOSIS — R1310 Dysphagia, unspecified: Secondary | ICD-10-CM | POA: Diagnosis not present

## 2018-06-10 DIAGNOSIS — L89009 Pressure ulcer of unspecified elbow, unspecified stage: Secondary | ICD-10-CM | POA: Diagnosis not present

## 2018-06-10 DIAGNOSIS — I69920 Aphasia following unspecified cerebrovascular disease: Secondary | ICD-10-CM | POA: Diagnosis not present

## 2018-06-10 DIAGNOSIS — I63311 Cerebral infarction due to thrombosis of right middle cerebral artery: Secondary | ICD-10-CM | POA: Diagnosis not present

## 2018-06-10 DIAGNOSIS — I739 Peripheral vascular disease, unspecified: Secondary | ICD-10-CM | POA: Diagnosis not present

## 2018-06-10 NOTE — Progress Notes (Signed)
Location:  [facility] Room Number: 201-B Place of Service:  SNF (31) Provider:  Kenard Gower, NP  Patient Care Team: Pecola Lawless, MD as PCP - General (Internal Medicine) Medina-Vargas, Margit Banda, NP as Nurse Practitioner (Internal Medicine)  Extended Emergency Contact Information Primary Emergency Contact: Jillyn, Stacey Mobile Phone: [phone] Relation: Sister Secondary Emergency Contact: [facility] Address: [address]          [place],  [address] Home Phone: [phone] Relation: None  Code Status:  DNR  Goals of care: Advanced Directive information Advanced Directives 06/10/2018  Does Patient Have a Medical Advance Directive? Yes  Type of Advance Directive Out of facility DNR (pink MOST or yellow form)  Does patient want to make changes to medical advance directive? No - Patient declined  Would patient like information on creating a medical advance directive? -  Pre-existing out of facility DNR order (yellow form or pink MOST form) -     Chief Complaint  Patient presents with  . Acute Visit    Patient has hypernatremia    HPI:  Pt is a 72 y.o. female seen today for an acute visit due to hypernatremia (sodium 174).  She is a long-term care resident of [facility]. She is also being followed by [facility]. She has a PMH of dementia, hypertension, peripheral vascular disease, and stroke. She was seen in the room with sister and hospice nurse at bedside. Sister reported that resident was having poor oral intake and wants her sister to have IV fluids. Hospice nurse reported that resident ate a magic cup. Noted wbc at 11.7, elevated and was pro phylactically started on Rocephin. Resident is nonverbal and has a blank stare upon verbal and tactile stimulation. She did not move any part of her body. No fever has been reported. Urinalysis was obtained yesterday due to having strong odor. Sister and son requested for Geodon to  be discontinued.    Past Medical History:  Diagnosis Date  . Cerebral infarction Feb 2019   . Dementia   . HLD (hyperlipidemia)   . Hypertension   . Peripheral vascular disease (HCC)   . Stroke [facility])    Past Surgical History:  Procedure Laterality Date  . CHOLECYSTECTOMY    . LEG SURGERY     Per sister patient shattered leg requiring surgery with placement of hardware  . STENT PLACEMENT ILIAC ([facility] HX)    . TEE WITHOUT CARDIOVERSION N/A 05/05/2018   Procedure: TRANSESOPHAGEAL ECHOCARDIOGRAM (TEE);  Surgeon: Thurmon Fair, MD;  Location: [facility] ENDOSCOPY;  Service: Cardiovascular;  Laterality: N/A;    Allergies  Allergen Reactions  . Seroquel [Quetiapine Fumarate] Other (See Comments)    Pt coded, per her sister    Outpatient Encounter Medications as of 06/10/2018  Medication Sig  . acetaminophen (TYLENOL) 500 MG tablet Take 500 mg by mouth See admin instructions. Give routinely TID, may also have PRN TID.  Marland Kitchen Amino Acids-Protein Hydrolys (FEEDING SUPPLEMENT, PRO-STAT SUGAR FREE 64,) LIQD Take 30 mLs by mouth daily.  Marland Kitchen amLODipine (NORVASC) 10 MG tablet Take 10 mg by mouth daily.  Marland Kitchen aspirin EC 81 MG tablet Take 1 tablet (81 mg total) by mouth daily. With Food  . cefTRIAXone (ROCEPHIN) 1 g injection Inject 1 g into the muscle daily. x5 days  . clopidogrel (PLAVIX) 75 MG tablet Take 75 mg by mouth daily.  . hydrALAZINE (APRESOLINE) 50 MG tablet Take 1 tablet (50 mg total) by mouth every 8 (eight) hours.  Marland Kitchen  mirtazapine (REMERON) 15 MG tablet Take 15 mg by mouth at bedtime.  . Nutritional Supplements (NUTRITIONAL SUPPLEMENT PO) Take 1 each by mouth 3 (three) times daily. Magic Cup   . sennosides-docusate sodium (SENOKOT-S) 8.6-50 MG tablet Take 2 tablets by mouth at bedtime.  . ziprasidone (GEODON) 20 MG capsule Take 20 mg by mouth 2 (two) times daily with a meal. Give as an injection if patient refuses to take orally.  . [DISCONTINUED] acetaminophen (TYLENOL) 325 MG tablet Take 650 mg  by mouth every 6 (six) hours as needed for mild pain, fever or headache.   . [DISCONTINUED] donepezil (ARICEPT) 10 MG tablet Take 10 mg by mouth at bedtime.   No facility-administered encounter medications on file as of 06/10/2018.     Review of Systems  Unable to obtain due to encephalopathy    Immunization History  Administered Date(s) Administered  . Influenza-Unspecified 08/23/2017   Pertinent  Health Maintenance Due  Topic Date Due  . COLONOSCOPY  06/11/2019 (Originally 09/07/2017)  . INFLUENZA VACCINE  06/23/2018  . MAMMOGRAM  Discontinued  . DEXA SCAN  Discontinued  . PNA vac Low Risk Adult  Discontinued   Fall Risk  05/24/2018  Falls in the past year? No    Vitals:   06/10/18 0910  BP: 112/72  Pulse: 89  Resp: 16  Temp: 99.2 F (37.3 C)  TempSrc: Oral  SpO2: 96%  Weight: 141 lb 3.2 oz (64 kg)  Height: 5\' 6"  (1.676 m)   Body mass index is 22.79 kg/m.  Physical Exam  GENERAL APPEARANCE:  Normal body habitus MOUTH and THROAT: Lips are without lesions. Oral mucosa is dry RESPIRATORY: Breathing is even & unlabored, BS CTAB CARDIAC: RRR, no murmur,no extra heart sounds, no edema GI: Abdomen soft, normal BS, no masses, no tenderness EXTREMITIES:  No edema, no cyanosis  Labs reviewed: Recent Labs    01/25/18 0722  05/02/18 0331 05/04/18 1616 05/05/18 0500  NA 144   < > 151* 143 143  K 3.7   < > 3.2* 3.0* 3.7  CL 113*   < > 113* 111 110  CO2 21*   < > 24 25 22   GLUCOSE 113*   < > 97 128* 112*  BUN 10   < > 14 5* <5*  CREATININE 0.83   < > 0.73 0.63 0.69  CALCIUM 8.7*   < > 8.9 8.8* 8.7*  MG 2.3  --   --   --   --    < > = values in this interval not displayed.   Recent Labs    01/21/18 1510 01/22/18 0345 05/01/18 1743  AST 38 36 23  ALT 48 44 19  ALKPHOS 82 74 69  BILITOT 1.1 1.4* 1.2  PROT 6.7 6.0* 6.6  ALBUMIN 3.5 3.1* 3.4*   Recent Labs    02/22/18 04/29/18 05/01/18 1743 05/01/18 1751 05/02/18 0331 05/05/18 0500  WBC 9.8 7.2 7.4  --   10.2 8.3  NEUTROABS 6 5 4.6  --   --   --   HGB 15.8 14.4 15.9* 16.7* 14.7 13.8  HCT 48* 45 51.6* 49.0* 47.7* 43.5  MCV  --   --  92.8  --  92.8 90.4  PLT 277 231 248  --  229 183    Lab Results  Component Value Date   HGBA1C 5.5 05/02/2018   Lab Results  Component Value Date   CHOL 161 05/02/2018   HDL 36 (L) 05/02/2018   LDLCALC  102 (H) 05/02/2018   TRIG 115 05/02/2018   CHOLHDL 4.5 05/02/2018    Assessment/Plan  1. Hypernatremia - Na 174, has poor oral intake, sister requested IV fluids, will start D5 1/2 NS @ 70 ml/hour X 2 L via PIV, will repeat BMP after   2. Leukocytosis, unspecified type  - wbc 11.7, no fever has been reported, patient has encephalopathy and urine has strong odor, she probably has UTI, will start Rocephin 1 gm IM Q D X 5 days and Florastor 250 mg BID X 8 days, will repeat CBC   3. Depression, major, recurrent, severe with psychosis [facility](HCC) - son and sister requested for the Geodon to be discontinued and thinks that it might have contributed to her encephalopathy, will discontinue Geodon, will monitor behavior    Family/ staff Communication:  Discussed plan of care with hospice nurse, charge nurse and sister.  Labs/tests ordered:  Repeat BMP and CBC  Goals of care:   Long-term care/Hospice.   Kenard GowerMonina Medina-Vargas, NP [facility] [phone] (Monday-Friday 8:00 a.m. - 5:00 p.m.) [phone] (after hours)

## 2018-06-11 DIAGNOSIS — I63311 Cerebral infarction due to thrombosis of right middle cerebral artery: Secondary | ICD-10-CM | POA: Diagnosis not present

## 2018-06-11 DIAGNOSIS — R1310 Dysphagia, unspecified: Secondary | ICD-10-CM | POA: Diagnosis not present

## 2018-06-11 DIAGNOSIS — I69920 Aphasia following unspecified cerebrovascular disease: Secondary | ICD-10-CM | POA: Diagnosis not present

## 2018-06-11 DIAGNOSIS — I679 Cerebrovascular disease, unspecified: Secondary | ICD-10-CM | POA: Diagnosis not present

## 2018-06-11 DIAGNOSIS — I739 Peripheral vascular disease, unspecified: Secondary | ICD-10-CM | POA: Diagnosis not present

## 2018-06-11 DIAGNOSIS — L89009 Pressure ulcer of unspecified elbow, unspecified stage: Secondary | ICD-10-CM | POA: Diagnosis not present

## 2018-06-12 DIAGNOSIS — I63311 Cerebral infarction due to thrombosis of right middle cerebral artery: Secondary | ICD-10-CM | POA: Diagnosis not present

## 2018-06-12 DIAGNOSIS — I69920 Aphasia following unspecified cerebrovascular disease: Secondary | ICD-10-CM | POA: Diagnosis not present

## 2018-06-12 DIAGNOSIS — I739 Peripheral vascular disease, unspecified: Secondary | ICD-10-CM | POA: Diagnosis not present

## 2018-06-12 DIAGNOSIS — R1310 Dysphagia, unspecified: Secondary | ICD-10-CM | POA: Diagnosis not present

## 2018-06-12 DIAGNOSIS — I679 Cerebrovascular disease, unspecified: Secondary | ICD-10-CM | POA: Diagnosis not present

## 2018-06-12 DIAGNOSIS — L89009 Pressure ulcer of unspecified elbow, unspecified stage: Secondary | ICD-10-CM | POA: Diagnosis not present

## 2018-06-12 LAB — BASIC METABOLIC PANEL
BUN: 32 — AB (ref 4–21)
Creatinine: 0.9 (ref 0.5–1.1)
GLUCOSE: 127
Potassium: 3.2 — AB (ref 3.4–5.3)
SODIUM: 168 — AB (ref 137–147)

## 2018-06-13 DIAGNOSIS — D649 Anemia, unspecified: Secondary | ICD-10-CM | POA: Diagnosis not present

## 2018-06-13 DIAGNOSIS — L89620 Pressure ulcer of left heel, unstageable: Secondary | ICD-10-CM | POA: Diagnosis not present

## 2018-06-13 DIAGNOSIS — L89152 Pressure ulcer of sacral region, stage 2: Secondary | ICD-10-CM | POA: Diagnosis not present

## 2018-06-13 DIAGNOSIS — G9341 Metabolic encephalopathy: Secondary | ICD-10-CM | POA: Diagnosis not present

## 2018-06-13 LAB — CBC AND DIFFERENTIAL
HEMATOCRIT: 44 (ref 36–46)
HEMOGLOBIN: 14.3 (ref 12.0–16.0)
NEUTROS ABS: 5
Platelets: 112 — AB (ref 150–399)
WBC: 8.9

## 2018-06-13 LAB — BASIC METABOLIC PANEL
BUN: 32 — AB (ref 4–21)
Creatinine: 0.8 (ref 0.5–1.1)
Glucose: 130
POTASSIUM: 3.6 (ref 3.4–5.3)
SODIUM: 172 — AB (ref 137–147)

## 2018-06-14 DIAGNOSIS — I63311 Cerebral infarction due to thrombosis of right middle cerebral artery: Secondary | ICD-10-CM | POA: Diagnosis not present

## 2018-06-14 DIAGNOSIS — I679 Cerebrovascular disease, unspecified: Secondary | ICD-10-CM | POA: Diagnosis not present

## 2018-06-14 DIAGNOSIS — R1310 Dysphagia, unspecified: Secondary | ICD-10-CM | POA: Diagnosis not present

## 2018-06-14 DIAGNOSIS — L89009 Pressure ulcer of unspecified elbow, unspecified stage: Secondary | ICD-10-CM | POA: Diagnosis not present

## 2018-06-14 DIAGNOSIS — I69920 Aphasia following unspecified cerebrovascular disease: Secondary | ICD-10-CM | POA: Diagnosis not present

## 2018-06-14 DIAGNOSIS — I739 Peripheral vascular disease, unspecified: Secondary | ICD-10-CM | POA: Diagnosis not present

## 2018-06-15 ENCOUNTER — Encounter: Payer: Self-pay | Admitting: Adult Health

## 2018-06-15 ENCOUNTER — Non-Acute Institutional Stay (SKILLED_NURSING_FACILITY): Payer: Medicare Other | Admitting: Adult Health

## 2018-06-15 DIAGNOSIS — E876 Hypokalemia: Secondary | ICD-10-CM | POA: Diagnosis not present

## 2018-06-15 DIAGNOSIS — R630 Anorexia: Secondary | ICD-10-CM | POA: Diagnosis not present

## 2018-06-15 DIAGNOSIS — Z515 Encounter for palliative care: Secondary | ICD-10-CM | POA: Diagnosis not present

## 2018-06-15 DIAGNOSIS — E870 Hyperosmolality and hypernatremia: Secondary | ICD-10-CM

## 2018-06-15 DIAGNOSIS — F0390 Unspecified dementia without behavioral disturbance: Secondary | ICD-10-CM | POA: Diagnosis not present

## 2018-06-15 DIAGNOSIS — D649 Anemia, unspecified: Secondary | ICD-10-CM | POA: Diagnosis not present

## 2018-06-15 DIAGNOSIS — I69398 Other sequelae of cerebral infarction: Secondary | ICD-10-CM | POA: Diagnosis not present

## 2018-06-15 LAB — BASIC METABOLIC PANEL
BUN: 24 — AB (ref 4–21)
Creatinine: 0.7 (ref 0.5–1.1)
Glucose: 138
POTASSIUM: 3 — AB (ref 3.4–5.3)
Sodium: 168 — AB (ref 137–147)

## 2018-06-15 NOTE — Progress Notes (Signed)
Location:  [facility] Room Number: 201-B Place of Service:  SNF (31) Provider:  Kenard Gower, NP  Patient Care Team: Pecola Lawless, MD as PCP - General (Internal Medicine) Medina-Vargas, Margit Banda, NP as Nurse Practitioner (Internal Medicine)  Extended Emergency Contact Information Primary Emergency Contact: Isra, Lindy Mobile Phone: [phone] Relation: Sister Secondary Emergency Contact: [facility] Address: [address]          [place],  [address] Home Phone: [phone] Relation: None  Code Status:  DNR  Goals of care: Advanced Directive information Advanced Directives 06/10/2018  Does Patient Have a Medical Advance Directive? Yes  Type of Advance Directive Out of facility DNR (pink MOST or yellow form)  Does patient want to make changes to medical advance directive? No - Patient declined  Would patient like information on creating a medical advance directive? -  Pre-existing out of facility DNR order (yellow form or pink MOST form) -     Chief Complaint  Patient presents with  . Acute Visit    Patient with hypokalemia and hypernatremia demonstrated on labs.    HPI:  Pt is a 72 y.o. female seen today for an acute visit secondary to hypokalemia (potassium 3.0), and hypernatremia (sodium 168) demonstrated on labs.  She is a long-term care resident of [facility].  She is also followed by [facility].  She has a PMH of dementia, hypertension, peripheral vascular disease, and stroke. She has been given D5 1/2 NS X 2L due to hypernatremia, Na 174. She was seen in her room today and is able to open her eyes and able to respond "good"  When asked how she is doing. Previously, she was nonverbal and asleep. She ate 25% of her breakfast and 50% of her lunch. She is being followed up by hospice     Past Medical History:  Diagnosis Date  . Cerebral infarction Feb 2019   . Dementia   . HLD (hyperlipidemia)   . Hypertension    . Peripheral vascular disease (HCC)   . Stroke [facility])    Past Surgical History:  Procedure Laterality Date  . CHOLECYSTECTOMY    . LEG SURGERY     Per sister patient shattered leg requiring surgery with placement of hardware  . STENT PLACEMENT ILIAC ([facility] HX)    . TEE WITHOUT CARDIOVERSION N/A 05/05/2018   Procedure: TRANSESOPHAGEAL ECHOCARDIOGRAM (TEE);  Surgeon: Thurmon Fair, MD;  Location: [facility] ENDOSCOPY;  Service: Cardiovascular;  Laterality: N/A;    Allergies  Allergen Reactions  . Seroquel [Quetiapine Fumarate] Other (See Comments)    Pt coded, per her sister    Outpatient Encounter Medications as of 06/15/2018  Medication Sig  . acetaminophen (TYLENOL) 500 MG tablet Take 500 mg by mouth See admin instructions. Give routinely TID, may also have PRN TID.  Marland Kitchen Amino Acids-Protein Hydrolys (FEEDING SUPPLEMENT, PRO-STAT SUGAR FREE 64,) LIQD Take 30 mLs by mouth daily.  Marland Kitchen amLODipine (NORVASC) 10 MG tablet Take 10 mg by mouth daily.  Marland Kitchen aspirin EC 81 MG tablet Take 1 tablet (81 mg total) by mouth daily. With Food  . clopidogrel (PLAVIX) 75 MG tablet Take 75 mg by mouth daily.  . hydrALAZINE (APRESOLINE) 50 MG tablet Take 1 tablet (50 mg total) by mouth every 8 (eight) hours.  . mirtazapine (REMERON) 15 MG tablet Take 15 mg by mouth at bedtime.  . Nutritional Supplements (NUTRITIONAL SUPPLEMENT PO) Take 1 each by mouth 3 (three) times daily. Magic Cup   .  saccharomyces boulardii (FLORASTOR) 250 MG capsule Take 250 mg by mouth 2 (two) times daily.  . sennosides-docusate sodium (SENOKOT-S) 8.6-50 MG tablet Take 2 tablets by mouth at bedtime.  . [DISCONTINUED] ziprasidone (GEODON) 20 MG capsule Take 20 mg by mouth 2 (two) times daily with a meal. Give as an injection if patient refuses to take orally.   No facility-administered encounter medications on file as of 06/15/2018.     Review of Systems  Unable to obtain due to confusion    Immunization History  Administered Date(s)  Administered  . Influenza-Unspecified 08/23/2017   Pertinent  Health Maintenance Due  Topic Date Due  . COLONOSCOPY  06/11/2019 (Originally 09/07/2017)  . INFLUENZA VACCINE  06/23/2018  . MAMMOGRAM  Discontinued  . DEXA SCAN  Discontinued  . PNA vac Low Risk Adult  Discontinued   Fall Risk  05/24/2018  Falls in the past year? No      Vitals:   06/15/18 1552  BP: 112/71  Pulse: 61  Resp: 18  Temp: (!) 97.3 F (36.3 C)  TempSrc: Oral  SpO2: 94%  Weight: 141 lb 3.2 oz (64 kg)  Height: 5\' 6"  (1.676 m)   Body mass index is 22.79 kg/m.  Physical Exam  GENERAL APPEARANCE: Well nourished. In no acute distress. Normal body habitus MOUTH and THROAT: Lips are without lesions. Oral mucosa is moist and without lesions.  RESPIRATORY: Breathing is even & unlabored, BS CTAB CARDIAC: RRR, no murmur,no extra heart sounds, no edema GI: Abdomen soft, normal BS, no masses, no tenderness EXTREMITIES: No edema and cyanosis  PSYCHIATRIC:  Affect and behavior are appropriate   Labs reviewed: Recent Labs    01/25/18 0722  05/02/18 0331 05/04/18 1616 05/05/18 0500  06/12/18 06/13/18 06/15/18  NA 144   < > 151* 143 143   < > 168* 172* 168*  K 3.7   < > 3.2* 3.0* 3.7   < > 3.2* 3.6 3.0*  CL 113*   < > 113* 111 110  --   --   --   --   CO2 21*   < > 24 25 22   --   --   --   --   GLUCOSE 113*   < > 97 128* 112*  --   --   --   --   BUN 10   < > 14 5* <5*   < > 32* 32* 24*  CREATININE 0.83   < > 0.73 0.63 0.69   < > 0.9 0.8 0.7  CALCIUM 8.7*   < > 8.9 8.8* 8.7*  --   --   --   --   MG 2.3  --   --   --   --   --   --   --   --    < > = values in this interval not displayed.   Recent Labs    01/21/18 1510 01/22/18 0345 05/01/18 1743  AST 38 36 23  ALT 48 44 19  ALKPHOS 82 74 69  BILITOT 1.1 1.4* 1.2  PROT 6.7 6.0* 6.6  ALBUMIN 3.5 3.1* 3.4*   Recent Labs    05/01/18 1743  05/02/18 0331 05/05/18 0500 06/09/18 06/13/18  WBC 7.4  --  10.2 8.3 11.7 8.9  NEUTROABS 4.6  --   --    --  8 5  HGB 15.9*   < > 14.7 13.8 16.3* 14.3  HCT 51.6*   < > 47.7* 43.5 53*  44  MCV 92.8  --  92.8 90.4  --   --   PLT 248  --  229 183 168 112*   < > = values in this interval not displayed.    Lab Results  Component Value Date   HGBA1C 5.5 05/02/2018   Lab Results  Component Value Date   CHOL 161 05/02/2018   HDL 36 (L) 05/02/2018   LDLCALC 102 (H) 05/02/2018   TRIG 115 05/02/2018   CHOLHDL 4.5 05/02/2018    Assessment/Plan   1. Hypokalemia -  Start KCL 20 meq/15 ml (10%) give 30 ml= 40 meq PO @ 4 PM and 8PM, will re-check BMP Lab Results  Component Value Date   K 3.0 (A) 06/15/2018     2. Hypernatremia - Na 168, will give D5 1/2 NS @ 70 ml/H X 2L    Family/ staff Communication: Discussed plan of care with resident and charge nurse.  Labs/tests ordered:  BMP  Goals of care:   Long-term care/Hospice.   Kenard GowerMonina Medina-Vargas, NP [facility] [phone] (Monday-Friday 8:00 a.m. - 5:00 p.m.) [phone] (after hours)

## 2018-06-16 DIAGNOSIS — L89009 Pressure ulcer of unspecified elbow, unspecified stage: Secondary | ICD-10-CM | POA: Diagnosis not present

## 2018-06-16 DIAGNOSIS — I69920 Aphasia following unspecified cerebrovascular disease: Secondary | ICD-10-CM | POA: Diagnosis not present

## 2018-06-16 DIAGNOSIS — I679 Cerebrovascular disease, unspecified: Secondary | ICD-10-CM | POA: Diagnosis not present

## 2018-06-16 DIAGNOSIS — I63311 Cerebral infarction due to thrombosis of right middle cerebral artery: Secondary | ICD-10-CM | POA: Diagnosis not present

## 2018-06-16 DIAGNOSIS — R1310 Dysphagia, unspecified: Secondary | ICD-10-CM | POA: Diagnosis not present

## 2018-06-16 DIAGNOSIS — I739 Peripheral vascular disease, unspecified: Secondary | ICD-10-CM | POA: Diagnosis not present

## 2018-06-17 DIAGNOSIS — I679 Cerebrovascular disease, unspecified: Secondary | ICD-10-CM | POA: Diagnosis not present

## 2018-06-17 DIAGNOSIS — L89009 Pressure ulcer of unspecified elbow, unspecified stage: Secondary | ICD-10-CM | POA: Diagnosis not present

## 2018-06-17 DIAGNOSIS — I63311 Cerebral infarction due to thrombosis of right middle cerebral artery: Secondary | ICD-10-CM | POA: Diagnosis not present

## 2018-06-17 DIAGNOSIS — R1310 Dysphagia, unspecified: Secondary | ICD-10-CM | POA: Diagnosis not present

## 2018-06-17 DIAGNOSIS — I739 Peripheral vascular disease, unspecified: Secondary | ICD-10-CM | POA: Diagnosis not present

## 2018-06-17 DIAGNOSIS — I69920 Aphasia following unspecified cerebrovascular disease: Secondary | ICD-10-CM | POA: Diagnosis not present

## 2018-06-18 DIAGNOSIS — R1310 Dysphagia, unspecified: Secondary | ICD-10-CM | POA: Diagnosis not present

## 2018-06-18 DIAGNOSIS — N179 Acute kidney failure, unspecified: Secondary | ICD-10-CM | POA: Diagnosis not present

## 2018-06-18 DIAGNOSIS — I679 Cerebrovascular disease, unspecified: Secondary | ICD-10-CM | POA: Diagnosis not present

## 2018-06-18 DIAGNOSIS — I10 Essential (primary) hypertension: Secondary | ICD-10-CM | POA: Diagnosis not present

## 2018-06-18 DIAGNOSIS — I69920 Aphasia following unspecified cerebrovascular disease: Secondary | ICD-10-CM | POA: Diagnosis not present

## 2018-06-18 DIAGNOSIS — L89009 Pressure ulcer of unspecified elbow, unspecified stage: Secondary | ICD-10-CM | POA: Diagnosis not present

## 2018-06-18 DIAGNOSIS — I63311 Cerebral infarction due to thrombosis of right middle cerebral artery: Secondary | ICD-10-CM | POA: Diagnosis not present

## 2018-06-18 DIAGNOSIS — I739 Peripheral vascular disease, unspecified: Secondary | ICD-10-CM | POA: Diagnosis not present

## 2018-06-18 DIAGNOSIS — D649 Anemia, unspecified: Secondary | ICD-10-CM | POA: Diagnosis not present

## 2018-06-20 DIAGNOSIS — L89152 Pressure ulcer of sacral region, stage 2: Secondary | ICD-10-CM | POA: Diagnosis not present

## 2018-06-20 DIAGNOSIS — L89620 Pressure ulcer of left heel, unstageable: Secondary | ICD-10-CM | POA: Diagnosis not present

## 2018-06-21 ENCOUNTER — Non-Acute Institutional Stay (SKILLED_NURSING_FACILITY): Payer: Medicare Other | Admitting: Internal Medicine

## 2018-06-21 ENCOUNTER — Encounter: Payer: Self-pay | Admitting: Internal Medicine

## 2018-06-21 DIAGNOSIS — I69920 Aphasia following unspecified cerebrovascular disease: Secondary | ICD-10-CM | POA: Diagnosis not present

## 2018-06-21 DIAGNOSIS — G9341 Metabolic encephalopathy: Secondary | ICD-10-CM

## 2018-06-21 DIAGNOSIS — Z9189 Other specified personal risk factors, not elsewhere classified: Secondary | ICD-10-CM | POA: Insufficient documentation

## 2018-06-21 DIAGNOSIS — I739 Peripheral vascular disease, unspecified: Secondary | ICD-10-CM | POA: Diagnosis not present

## 2018-06-21 DIAGNOSIS — I679 Cerebrovascular disease, unspecified: Secondary | ICD-10-CM | POA: Diagnosis not present

## 2018-06-21 DIAGNOSIS — L89009 Pressure ulcer of unspecified elbow, unspecified stage: Secondary | ICD-10-CM | POA: Diagnosis not present

## 2018-06-21 DIAGNOSIS — E870 Hyperosmolality and hypernatremia: Secondary | ICD-10-CM

## 2018-06-21 DIAGNOSIS — I63311 Cerebral infarction due to thrombosis of right middle cerebral artery: Secondary | ICD-10-CM | POA: Diagnosis not present

## 2018-06-21 DIAGNOSIS — R1310 Dysphagia, unspecified: Secondary | ICD-10-CM | POA: Diagnosis not present

## 2018-06-21 NOTE — Assessment & Plan Note (Signed)
Multifactorial but severe hypernatremia is greatest impact Supportive care

## 2018-06-21 NOTE — Assessment & Plan Note (Signed)
Unable to protect airway due to unresponsive state Suction as needed Sublingual morphine for respiratory compromise

## 2018-06-21 NOTE — Assessment & Plan Note (Signed)
Minimal benefit with IV fluids Supportive care

## 2018-06-21 NOTE — Patient Instructions (Signed)
See assessment and plan under each diagnosis in the problem list and acutely for this visit 

## 2018-06-21 NOTE — Progress Notes (Signed)
    NURSING HOME LOCATION:  [facility] ROOM NUMBER:  [id number]  CODE STATUS:  DNR  PCP:  Pecola LawlessHopper, Kerra Guilfoil F, MD  [address] [place] [id number]  This is a nursing facility follow up for specific acute issue of possible aspiration.  Interim medical record and care since last [facility] visit was updated with review of diagnostic studies and change in clinical status since last visit were documented.  HPI: Morgan Sutton, the Hospice Nurse, assessed the patient and feels she is transitioning to a terminal state. She was found unresponsive this morning with temperature up to 102 axillary. She has had hypernatremia but IV fluids did not impact the hypernatremia which has ranged from a high of 174 on 06/09/18 to most recent value of 168. Creatinine was 0.7 which probably reflects protein wasting rather than good renal function. Staff attempted feeding  today but she was found with a mouthful of food and coarse rhonchi. Morgan Sutton has discussed the situation with her son Tammy SoursGreg who is comfortable with supportive care.  Review of systems: Could not be completed as she is unresponsive.   Physical exam:  Pertinent or positive findings: As noted she's unresponsive. Tongue protrudes from the mouth. Coarse rhonchi are noted in all lung fields, obscuring heart sounds. Pedal pulses are decreased. Limbs are flaccid and fall to her lap or bed when lifted.  General appearance: surprisingly no increased work of breathing is present.   Lymphatic: No lymphadenopathy about the head, neck, axilla. Nose:  External nasal examination shows no deformity or inflammation. Nasal mucosa are pink and moist without lesions, exudates Oral exam:  Lips not cyanotic Neck:  No thyromegaly, masses, tenderness noted.    Abdomen: Abdomen is nontender with no organomegaly, hernias, masses. GU: Deferred  Extremities:  No cyanosis, clubbing, edema  Skin: Warm & dry w/o tenting. No significant lesions or rash.  See summary  under each active problem in the Problem List with associated updated therapeutic plan

## 2018-06-22 DIAGNOSIS — R1310 Dysphagia, unspecified: Secondary | ICD-10-CM | POA: Diagnosis not present

## 2018-06-22 DIAGNOSIS — I679 Cerebrovascular disease, unspecified: Secondary | ICD-10-CM | POA: Diagnosis not present

## 2018-06-22 DIAGNOSIS — I69920 Aphasia following unspecified cerebrovascular disease: Secondary | ICD-10-CM | POA: Diagnosis not present

## 2018-06-22 DIAGNOSIS — I63311 Cerebral infarction due to thrombosis of right middle cerebral artery: Secondary | ICD-10-CM | POA: Diagnosis not present

## 2018-06-22 DIAGNOSIS — L89009 Pressure ulcer of unspecified elbow, unspecified stage: Secondary | ICD-10-CM | POA: Diagnosis not present

## 2018-06-22 DIAGNOSIS — I739 Peripheral vascular disease, unspecified: Secondary | ICD-10-CM | POA: Diagnosis not present

## 2018-06-23 DIAGNOSIS — L89009 Pressure ulcer of unspecified elbow, unspecified stage: Secondary | ICD-10-CM | POA: Diagnosis not present

## 2018-06-23 DIAGNOSIS — I63311 Cerebral infarction due to thrombosis of right middle cerebral artery: Secondary | ICD-10-CM | POA: Diagnosis not present

## 2018-06-23 DIAGNOSIS — F339 Major depressive disorder, recurrent, unspecified: Secondary | ICD-10-CM | POA: Diagnosis not present

## 2018-06-23 DIAGNOSIS — R634 Abnormal weight loss: Secondary | ICD-10-CM | POA: Diagnosis not present

## 2018-06-23 DIAGNOSIS — I25119 Atherosclerotic heart disease of native coronary artery with unspecified angina pectoris: Secondary | ICD-10-CM | POA: Diagnosis not present

## 2018-06-23 DIAGNOSIS — I739 Peripheral vascular disease, unspecified: Secondary | ICD-10-CM | POA: Diagnosis not present

## 2018-06-23 DIAGNOSIS — F0281 Dementia in other diseases classified elsewhere with behavioral disturbance: Secondary | ICD-10-CM | POA: Diagnosis not present

## 2018-06-23 DIAGNOSIS — I679 Cerebrovascular disease, unspecified: Secondary | ICD-10-CM | POA: Diagnosis not present

## 2018-06-23 DIAGNOSIS — I10 Essential (primary) hypertension: Secondary | ICD-10-CM | POA: Diagnosis not present

## 2018-06-23 DIAGNOSIS — E785 Hyperlipidemia, unspecified: Secondary | ICD-10-CM | POA: Diagnosis not present

## 2018-06-23 DIAGNOSIS — R1310 Dysphagia, unspecified: Secondary | ICD-10-CM | POA: Diagnosis not present

## 2018-06-23 DIAGNOSIS — I69920 Aphasia following unspecified cerebrovascular disease: Secondary | ICD-10-CM | POA: Diagnosis not present

## 2018-06-23 DIAGNOSIS — G619 Inflammatory polyneuropathy, unspecified: Secondary | ICD-10-CM | POA: Diagnosis not present

## 2018-06-24 DIAGNOSIS — I679 Cerebrovascular disease, unspecified: Secondary | ICD-10-CM | POA: Diagnosis not present

## 2018-06-24 DIAGNOSIS — R1310 Dysphagia, unspecified: Secondary | ICD-10-CM | POA: Diagnosis not present

## 2018-06-24 DIAGNOSIS — L89009 Pressure ulcer of unspecified elbow, unspecified stage: Secondary | ICD-10-CM | POA: Diagnosis not present

## 2018-06-24 DIAGNOSIS — I69920 Aphasia following unspecified cerebrovascular disease: Secondary | ICD-10-CM | POA: Diagnosis not present

## 2018-06-24 DIAGNOSIS — I739 Peripheral vascular disease, unspecified: Secondary | ICD-10-CM | POA: Diagnosis not present

## 2018-06-24 DIAGNOSIS — I63311 Cerebral infarction due to thrombosis of right middle cerebral artery: Secondary | ICD-10-CM | POA: Diagnosis not present

## 2018-06-25 DIAGNOSIS — I739 Peripheral vascular disease, unspecified: Secondary | ICD-10-CM | POA: Diagnosis not present

## 2018-06-25 DIAGNOSIS — L89009 Pressure ulcer of unspecified elbow, unspecified stage: Secondary | ICD-10-CM | POA: Diagnosis not present

## 2018-06-25 DIAGNOSIS — I679 Cerebrovascular disease, unspecified: Secondary | ICD-10-CM | POA: Diagnosis not present

## 2018-06-25 DIAGNOSIS — I63311 Cerebral infarction due to thrombosis of right middle cerebral artery: Secondary | ICD-10-CM | POA: Diagnosis not present

## 2018-06-25 DIAGNOSIS — I69920 Aphasia following unspecified cerebrovascular disease: Secondary | ICD-10-CM | POA: Diagnosis not present

## 2018-06-25 DIAGNOSIS — R1310 Dysphagia, unspecified: Secondary | ICD-10-CM | POA: Diagnosis not present

## 2018-06-26 DIAGNOSIS — L89009 Pressure ulcer of unspecified elbow, unspecified stage: Secondary | ICD-10-CM | POA: Diagnosis not present

## 2018-06-26 DIAGNOSIS — I679 Cerebrovascular disease, unspecified: Secondary | ICD-10-CM | POA: Diagnosis not present

## 2018-06-26 DIAGNOSIS — I739 Peripheral vascular disease, unspecified: Secondary | ICD-10-CM | POA: Diagnosis not present

## 2018-06-26 DIAGNOSIS — R1310 Dysphagia, unspecified: Secondary | ICD-10-CM | POA: Diagnosis not present

## 2018-06-26 DIAGNOSIS — I63311 Cerebral infarction due to thrombosis of right middle cerebral artery: Secondary | ICD-10-CM | POA: Diagnosis not present

## 2018-06-26 DIAGNOSIS — I69920 Aphasia following unspecified cerebrovascular disease: Secondary | ICD-10-CM | POA: Diagnosis not present

## 2018-07-24 DEATH — deceased

## 2019-03-20 IMAGING — MR MR MRA HEAD W/O CM
10 of 12 series · 32 of 48 positions shown · non-contrast
Comparison: Prior CT from 05/01/2018 as well as previous MRI from
12/30/2017.

ADDENDUM:
There is a dictation error in the findings and impression portion of
the initially dictated report. The acute ischemic infarct is
positioned within the left ventral pons, not the right.
CLINICAL DATA: Initial evaluation for acute right-sided weakness.

EXAM:
MRI HEAD WITHOUT CONTRAST
MRA HEAD WITHOUT CONTRAST
TECHNIQUE: Multiplanar, multiecho pulse sequences of the brain and surrounding
structures were obtained without intravenous contrast. Angiographic
images of the head were obtained using MRA technique without
contrast.

[Series 5: ax dwi_tracew · axial · 3.0mm · 1.50mm/px · z∈[-44,+102]mm · 6 of 80 slices shown]
[im 1/80]
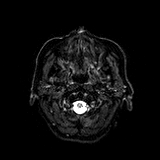
[im 16/80]
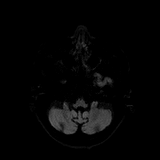
[im 32/80]
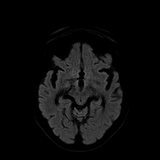
[im 48/80]
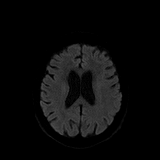
[im 64/80]
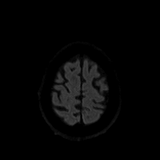
[im 80/80]
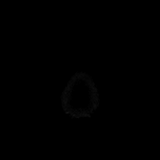

[Series 6: ax dwi_adc · axial · 3.0mm · 1.50mm/px · z∈[-44,+102]mm · 3 of 40 slices shown]
[im 1/40]
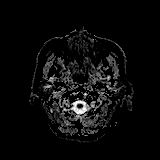
[im 20/40]
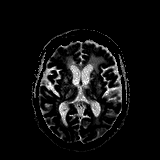
[im 40/40]
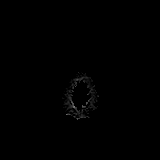

[Series 7: cor dwi_tracew · coronal · 5.0mm · 1.44mm/px · 5 of 64 slices shown]
[im 1/64]
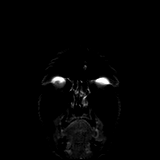
[im 16/64]
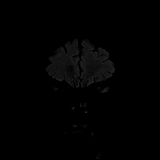
[im 32/64]
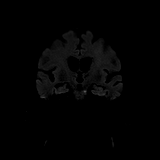
[im 48/64]
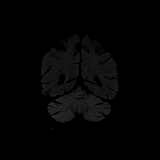
[im 64/64]
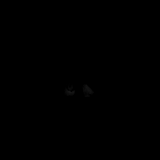

[Series 8: cor dwi_adc · coronal · 5.0mm · 1.44mm/px · 2 of 32 slices shown]
[im 1/32]
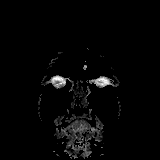
[im 32/32]
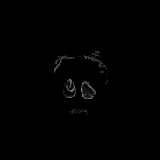

[Series 9: T1 · sagittal · 5.0mm · 0.75mm/px · 2 of 23 slices shown]
[im 1/23]
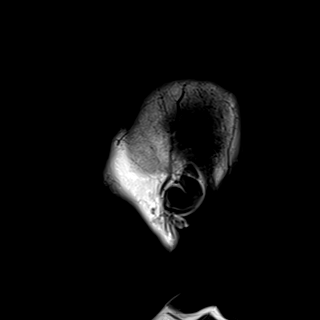
[im 23/23]
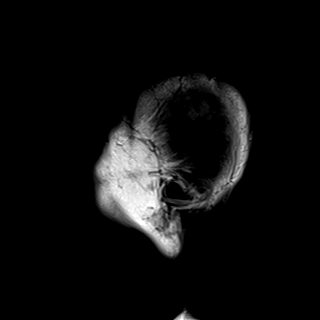

[Series 10: T2 · axial · 5.0mm · 0.69mm/px · z∈[-56,+93]mm · 2 of 26 slices shown (1 of 2)]
[im 1/26]
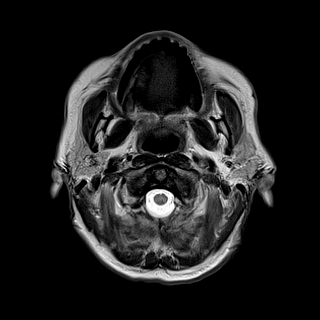
[im 26/26]
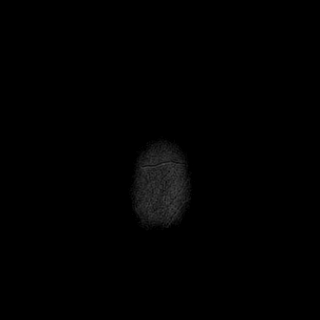

[Series 11: FLAIR · axial · 5.0mm · 0.43mm/px · z∈[-58,+91]mm · 2 of 26 slices shown]
[im 1/26]
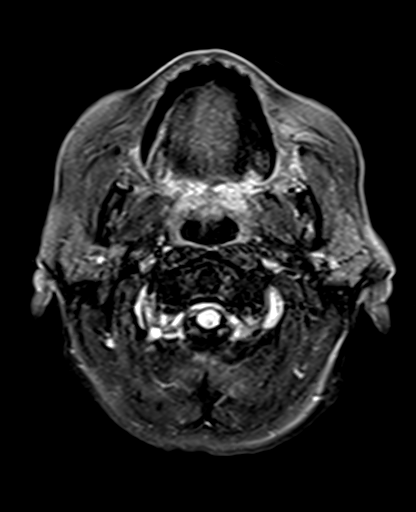
[im 26/26]
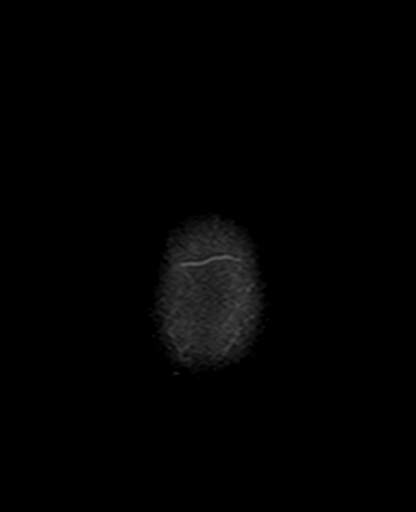

[Series 12: swi_images · axial · 3.0mm · 0.86mm/px · z∈[-72,+104]mm · 4 of 60 slices shown]
[im 1/60]
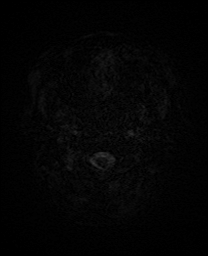
[im 20/60]
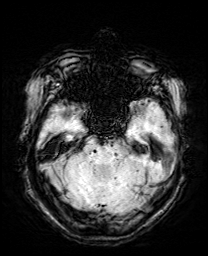
[im 40/60]
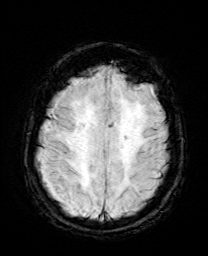
[im 60/60]
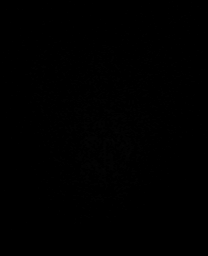

[Series 13: mip_images(sw) · axial · 24.0mm · 0.86mm/px · z∈[-62,+93]mm · 4 of 53 slices shown]
[im 1/53]
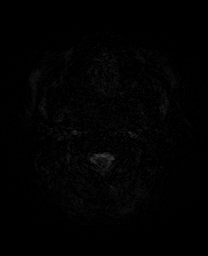
[im 18/53]
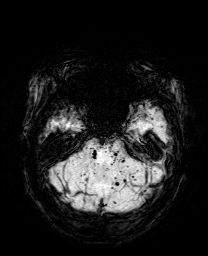
[im 35/53]
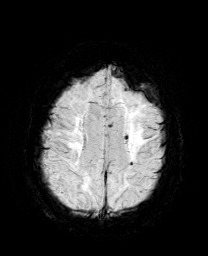
[im 53/53]
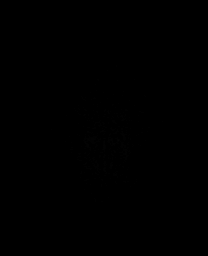

[Series 15: T2 · coronal · 5.0mm · 0.34mm/px · 2 of 29 slices shown (2 of 2)]
[im 1/29]
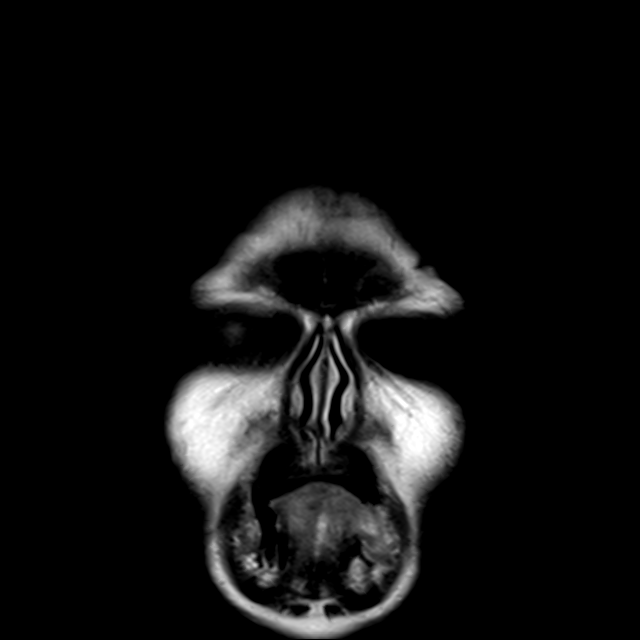
[im 29/29]
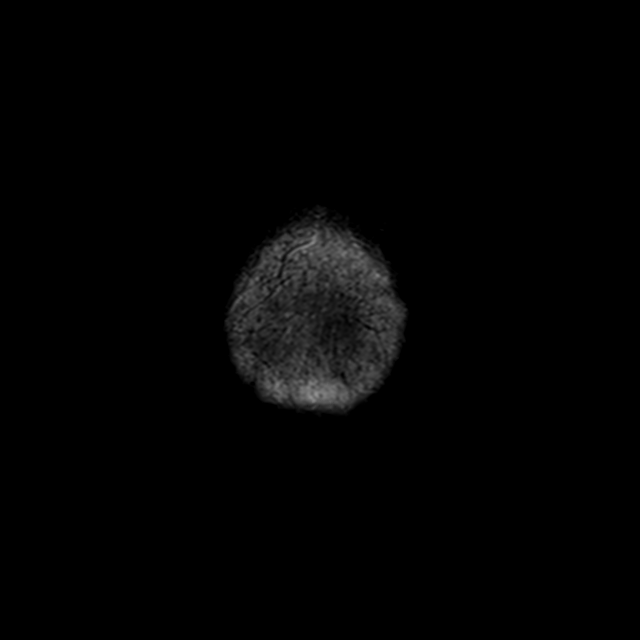

[32 of 48 positions shown; findings below may reference images not displayed]

FINDINGS: MRI HEAD FINDINGS

Brain: Advanced cerebral atrophy for age. Extensive patchy and
confluent T2/FLAIR hyperintensity throughout the periventricular and
deep white matter both cerebral hemispheres, consistent with chronic
microvascular ischemic disease, fairly advanced in nature. Chronic
microvascular ischemic changes extend into the brainstem and middle
cerebellar peduncles as well. Multiple superimposed remote lacunar
infarcts present within the hemispheric cerebral white matter, deep
gray nuclei, and brainstem as well as the right middle cerebellar
peduncle. Innumerable chronic micro hemorrhages again seen,
primarily clustered around the deep gray nuclei and cerebellum,
favored to reflect sequelae of poorly controlled hypertension.

6 mm acute ischemic infarct present at the right ventral pons
(series 5, image 53). Probable additional faint subacute 6 mm
ischemia infarct within the right periatrial white matter (series 5,
image 60). No associated hemorrhage or mass effect. No other
evidence for acute or subacute ischemia.

No mass lesion, midline shift or mass effect. Diffuse ventricular
prominence related to global parenchymal volume loss of
hydrocephalus. No extra-axial fluid collection. Pituitary gland
within normal limits.

Vascular: Abnormal flow void within the left V4 segment, likely
occluded. The major intracranial vascular flow voids otherwise
maintained.

Skull and upper cervical spine: Craniocervical junction within
normal limits. Upper cervical spine demonstrates no significant
finding. Bone marrow signal intensity within normal limits. No scalp
soft tissue abnormality.

Sinuses/Orbits: Globes and orbital soft tissues within normal
limits. Patient status post lens extraction on the left. Paranasal
sinuses are clear. Small volume opacity within the mastoid air cells
bilaterally, of doubtful significance. Inner ear structures normal.

Other: None.

MRA HEAD FINDINGS

ANTERIOR CIRCULATION:

Distal examination degraded by motion artifact.

Distal cervical segments of the internal carotid arteries are patent
with antegrade flow. Petrous segments patent bilaterally. Scattered
atheromatous irregularity within the cavernous/supraclinoid ICAs
without high-grade stenosis, left worse than right. A1 segments
patent bilaterally anterior communicating artery grossly normal.
Anterior cerebral arteries irregular but patent to their distal
aspects without definite stenosis.

M1 segments mildly irregular but patent without high-grade stenosis.
Right MCA bifurcates early. Severe proximal left M2 stenoses noted
(series 2559, image 8). Advanced small vessel atheromatous
irregularity throughout the MCA branches distally.

POSTERIOR CIRCULATION:

Visualized right vertebral artery patent to the vertebrobasilar
junction without stenosis. Left V4 segment not visualized, occluded.
Slight retrograde filling into the distal left V4 segment. Right
PICA patent proximally. Left PICA not visualized. Basilar irregular
but patent to its distal aspect without flow-limiting stenosis.
Superior cerebral arteries patent proximally. Probable severe left
SCA stenosis noted (series 2153, image 9). Both of the PCAs arise
from the basilar artery. Moderate right P1 stenosis (series 2153,
image 9). Additional moderate distal right P2 stenosis (series 2153,
image 16). Additional atheromatous irregularity throughout the
remaining PCAs bilaterally, which do remain patent to their distal
aspects.

No aneurysm.
IMPRESSION: MRI HEAD IMPRESSION:

1. 6 mm acute ischemic nonhemorrhagic ventral right pontine infarct.
2. 6 mm subacute small vessel white matter infarct involving the
right periatrial white matter.
3. Atrophy with severe chronic microvascular ischemic disease.
4. Innumerable chronic micro hemorrhages throughout the brain,
favored to be secondary to poorly controlled hypertension.

MRA HEAD IMPRESSION:

1. Occlusion of the left vertebral artery.
2. Advanced atheromatous change throughout the intracranial
circulation. Notable findings include severe proximal left M2
stenoses, severe left SCA stenosis, with moderate right P1 and P2
stenoses.
